# Patient Record
Sex: Female | Born: 1955
Health system: Southern US, Community
[De-identification: ages and names within clinical notes are randomized; demographics above are authoritative.]

## PROBLEM LIST (undated history)

## (undated) DIAGNOSIS — E876 Hypokalemia: Secondary | ICD-10-CM

## (undated) DIAGNOSIS — M1990 Unspecified osteoarthritis, unspecified site: Secondary | ICD-10-CM

## (undated) DIAGNOSIS — I4891 Unspecified atrial fibrillation: Secondary | ICD-10-CM

## (undated) DIAGNOSIS — E663 Overweight: Secondary | ICD-10-CM

## (undated) DIAGNOSIS — I10 Essential (primary) hypertension: Secondary | ICD-10-CM

## (undated) HISTORY — DX: Essential (primary) hypertension: I10

## (undated) HISTORY — DX: Unspecified osteoarthritis, unspecified site: M19.90

## (undated) HISTORY — DX: Unspecified atrial fibrillation: I48.91

## (undated) HISTORY — PX: LAPAROSCOPY: SHX197

## (undated) HISTORY — PX: OTHER SURGICAL HISTORY: SHX169

## (undated) HISTORY — PX: VESICOVAGINAL FISTULA CLOSURE W/ TAH: SUR271

## (undated) HISTORY — DX: Hypokalemia: E87.6

## (undated) HISTORY — PX: NASAL ENDOSCOPY: SHX286

## (undated) HISTORY — DX: Overweight: E66.3

## (undated) HISTORY — PX: CARDIAC ELECTROPHYSIOLOGY STUDY AND ABLATION: SHX1294

---

## 1998-05-22 ENCOUNTER — Other Ambulatory Visit: Admission: RE | Admit: 1998-05-22 | Discharge: 1998-05-22 | Payer: Self-pay | Admitting: *Deleted

## 2000-05-28 ENCOUNTER — Encounter (INDEPENDENT_AMBULATORY_CARE_PROVIDER_SITE_OTHER): Payer: Self-pay

## 2000-05-28 ENCOUNTER — Ambulatory Visit (HOSPITAL_COMMUNITY): Admission: RE | Admit: 2000-05-28 | Discharge: 2000-05-28 | Payer: Self-pay | Admitting: Obstetrics & Gynecology

## 2001-06-26 ENCOUNTER — Other Ambulatory Visit: Admission: RE | Admit: 2001-06-26 | Discharge: 2001-06-26 | Payer: Self-pay | Admitting: Obstetrics & Gynecology

## 2001-10-23 ENCOUNTER — Emergency Department (HOSPITAL_COMMUNITY): Admission: EM | Admit: 2001-10-23 | Discharge: 2001-10-23 | Payer: Self-pay | Admitting: Emergency Medicine

## 2002-02-22 ENCOUNTER — Ambulatory Visit (HOSPITAL_COMMUNITY): Admission: RE | Admit: 2002-02-22 | Discharge: 2002-02-22 | Payer: Self-pay | Admitting: *Deleted

## 2002-10-10 ENCOUNTER — Encounter: Payer: Self-pay | Admitting: Internal Medicine

## 2002-10-10 ENCOUNTER — Encounter: Admission: RE | Admit: 2002-10-10 | Discharge: 2002-10-10 | Payer: Self-pay | Admitting: Internal Medicine

## 2003-03-05 ENCOUNTER — Other Ambulatory Visit: Admission: RE | Admit: 2003-03-05 | Discharge: 2003-03-05 | Payer: Self-pay | Admitting: Obstetrics & Gynecology

## 2005-04-12 ENCOUNTER — Other Ambulatory Visit: Admission: RE | Admit: 2005-04-12 | Discharge: 2005-04-12 | Payer: Self-pay | Admitting: Obstetrics & Gynecology

## 2007-06-02 ENCOUNTER — Encounter: Admission: RE | Admit: 2007-06-02 | Discharge: 2007-06-02 | Payer: Self-pay | Admitting: Internal Medicine

## 2010-03-04 ENCOUNTER — Encounter: Admission: RE | Admit: 2010-03-04 | Discharge: 2010-03-04 | Payer: Self-pay | Admitting: Internal Medicine

## 2010-05-11 ENCOUNTER — Ambulatory Visit: Payer: Self-pay | Admitting: Cardiology

## 2010-05-11 ENCOUNTER — Other Ambulatory Visit: Payer: Self-pay | Admitting: Emergency Medicine

## 2010-05-11 ENCOUNTER — Inpatient Hospital Stay (HOSPITAL_COMMUNITY): Admission: EM | Admit: 2010-05-11 | Discharge: 2010-05-12 | Payer: Self-pay | Admitting: Cardiology

## 2010-05-12 ENCOUNTER — Encounter: Payer: Self-pay | Admitting: Cardiology

## 2010-05-12 ENCOUNTER — Encounter: Payer: Self-pay | Admitting: Internal Medicine

## 2010-05-13 ENCOUNTER — Telehealth: Payer: Self-pay | Admitting: Cardiology

## 2010-05-13 DIAGNOSIS — I10 Essential (primary) hypertension: Secondary | ICD-10-CM | POA: Insufficient documentation

## 2010-05-13 DIAGNOSIS — I4891 Unspecified atrial fibrillation: Secondary | ICD-10-CM | POA: Insufficient documentation

## 2010-05-13 DIAGNOSIS — Z8679 Personal history of other diseases of the circulatory system: Secondary | ICD-10-CM | POA: Insufficient documentation

## 2010-05-13 DIAGNOSIS — M1610 Unilateral primary osteoarthritis, unspecified hip: Secondary | ICD-10-CM | POA: Insufficient documentation

## 2010-05-13 DIAGNOSIS — M169 Osteoarthritis of hip, unspecified: Secondary | ICD-10-CM | POA: Insufficient documentation

## 2010-05-13 DIAGNOSIS — E876 Hypokalemia: Secondary | ICD-10-CM | POA: Insufficient documentation

## 2010-06-12 ENCOUNTER — Ambulatory Visit: Payer: Self-pay | Admitting: Cardiology

## 2010-10-11 ENCOUNTER — Emergency Department (HOSPITAL_BASED_OUTPATIENT_CLINIC_OR_DEPARTMENT_OTHER)
Admission: EM | Admit: 2010-10-11 | Discharge: 2010-10-11 | Payer: Self-pay | Source: Home / Self Care | Admitting: Emergency Medicine

## 2010-11-01 HISTORY — PX: JOINT REPLACEMENT: SHX530

## 2010-11-04 ENCOUNTER — Encounter: Payer: Self-pay | Admitting: Cardiology

## 2010-11-05 ENCOUNTER — Ambulatory Visit: Admit: 2010-11-05 | Payer: Self-pay | Admitting: Physician Assistant

## 2010-11-22 ENCOUNTER — Encounter: Payer: Self-pay | Admitting: Internal Medicine

## 2010-11-29 LAB — CONVERTED CEMR LAB
BUN: 21 mg/dL (ref 6–23)
Calcium: 10 mg/dL (ref 8.4–10.5)
Glucose, Bld: 90 mg/dL (ref 70–99)
Sodium: 140 meq/L (ref 135–145)

## 2010-12-01 NOTE — Letter (Signed)
Summary: [facility]  [facility]   Imported By: Marylou Mccoy 05/26/2010 09:13:00  _____________________________________________________________________  External Attachment:    Type:   Image     Comment:   External Document

## 2010-12-01 NOTE — Assessment & Plan Note (Signed)
Summary: eph/jml   Visit Type:  EPH Primary Provider:  Georgianne Fick  CC:  edema....Marland Kitchenno other complaints today.  History of Present Illness: Ms Mcclary returns today for followup of her paroxysmal atrial fibrillation with rapid ventricular rate.  Converted with IV diltiazem. Blood work was remarkable for potassium of 2.9, normal CPK-MB mildly elevated troponin. This is felt to be demand ischemia. TSH was normal. Hemoglobin was 12.9, blood sugar was normal.  She's had no recurrent symptoms. She was very symptomatic upon presentation.  Echocardiogram showed normal left ventricular systolic function, mild left ventricular hypertrophy, normal left atrial size.  She has a history of difficult to control hypertension for a number of years. She says she averages around 150/90. She checks regularly with an automated cuff. It sounds like her cuff may not be a large adult cuff.    Current Medications (verified): 1)  Cardizem Cd 240 Mg Xr24h-Cap (Diltiazem Hcl Coated Beads) .Marland Kitchen.. 1 Cap Once Daily 2)  Aspirin Ec 325 Mg Tbec (Aspirin) .... Take One Tablet By Mouth Daily 3)  Potassium Chloride Cr 10 Meq Cr-Tabs (Potassium Chloride) .Marland Kitchen.. 1 Tab Once Daily 4)  Furosemide 20 Mg Tabs (Furosemide) .Marland Kitchen.. 1 Tab Once Daily 5)  Micardis 80 Mg Tabs (Telmisartan) .Marland Kitchen.. 1 Tab Once Daily 6)  Multivitamins   Tabs (Multiple Vitamin) .Marland Kitchen.. 1 Tab  Once Daily 7)  Metoprolol Succinate 50 Mg Xr24h-Tab (Metoprolol Succinate) .Marland Kitchen.. 1 Tab Once Daily  Allergies (verified): 1)  ! * Shellfish  Past History:  Past Medical History: Last updated: 05/13/2010 Left hip osteoarthritis Hypertension H/O palpatations Hypokalemia  Past Surgical History: Last updated: 05/13/2010 Hysteroscopy, dilatation and curettage and removal of endometrial polyp, followed by laparoscopy with lysis of pelvic adhesions, drainage of follicular functional-type cyst of left ovary, uterosacral nerve ablation with bipolar coagulation. SURGEON:   Freddy Finner, M.D. Upper endoscopy.  Family History: Last updated: 06/12/2010 Noncontributory for early coronary artery disease.     Mother: deceased  73.Marland KitchenMarland KitchenMVA Father: deceased @ 3...cancer  Social History: Last updated: 05/13/2010  The patient lives in [place] with her husband.  She   is a Environmental consultant.  She has 2 children.  The patient has a 5 pack-  year tobacco history, but quit smoking 4 years ago.  She does drink an  occasional alcoholic beverage.  She denies any illicit drug use.  She  walks her dog for approximately 30 minutes a day.  She has no specific  diet.   Family History: Noncontributory for early coronary artery disease.     Mother: deceased  87.Marland KitchenMarland KitchenMVA Father: deceased @ 48...cancer  Review of Systems       negative other than history of present illness  Vital Signs:  Patient profile:   55 year old female Height:      69 inches Weight:      188.8 pounds BMI:     27.98 Pulse rate:   77 / minute Pulse rhythm:   irregular BP sitting:   146 / 100  (left arm) Cuff size:   large  Vitals Entered By: Danielle Rankin, CMA (June 12, 2010 2:55 PM)  Physical Exam  General:  overweight, in no acute distress Head:  normocephalic and atraumatic Eyes:  PERRLA/EOM intact; conjunctiva and lids normal. Neck:  Neck supple, no JVD. No masses, thyromegaly or abnormal cervical nodes. Chest Bambie Pizzolato:  no deformities or breast masses noted Lungs:  Clear bilaterally to auscultation and percussion. Heart:  PMI nondisplaced, normal S1-S2, no gallop, carotid strokes  equal bilaterally without bruits Msk:  Back normal, normal gait. Muscle strength and tone normal. Pulses:  pulses normal in all 4 extremities Extremities:  No clubbing or cyanosis. Neurologic:  Alert and oriented x 3. Skin:  Intact without lesions or rashes. Psych:  Normal affect.   EKG  Procedure date:  06/12/2010  Findings:      normal sinus rhythm, no acute changes.  Impression &  Recommendations:  Problem # 1:  ATRIAL FIBRILLATION (ICD-427.31) Assessment Improved  Her updated medication list for this problem includes:    Aspirin Ec 325 Mg Tbec (Aspirin) .Marland Kitchen... Take one tablet by mouth daily    Metoprolol Succinate 50 Mg Xr24h-tab (Metoprolol succinate) .Marland Kitchen... 1 tab once daily  Orders: EKG w/ Interpretation (93000) T-Basic Metabolic Panel ([id number])  Problem # 2:  HYPERTENSION (ICD-401.9) Assessment: Unchanged I have spent about 20 minutes talking to her about the importance of blood pressure control. I have increased her diltiazem to 360 mg a day. She is maximized on Micardis. Advised to check her blood pressure at rest with a large adult cuff and keep a record. She will return in 2 weeks for a nurse visit with her blood pressure record. If her pressures consistently above 140/90, we will add clonidine 0.2 b.i.d. I've advised her to increase the potassium in her diet. We will check electrolytes today. Her updated medication list for this problem includes:    Diltzac 360 Mg Xr24h-cap (Diltiazem hcl er beads) .Marland Kitchen... Take 1 tablet daily    Aspirin Ec 325 Mg Tbec (Aspirin) .Marland Kitchen... Take one tablet by mouth daily    Furosemide 20 Mg Tabs (Furosemide) .Marland Kitchen... 1 tab once daily    Micardis 80 Mg Tabs (Telmisartan) .Marland Kitchen... 1 tab once daily    Metoprolol Succinate 50 Mg Xr24h-tab (Metoprolol succinate) .Marland Kitchen... 1 tab once daily  Orders: EKG w/ Interpretation (93000) T-Basic Metabolic Panel ([id number])  Problem # 3:  HYPOKALEMIA (ICD-276.8) Assessment: Improved Will check today  Patient Instructions: 1)  Your physician recommends that you schedule a follow-up appointment in: 2 weeks with the nurse room for a blood pressure check.   2)  Your physician has requested that you limit the intake of sodium (salt) in your diet to two grams daily. Please see [facility] handout. 3)  Your physician recommends that you continue on your current medications as directed. Please refer to the  Current Medication list given to you today. CARDIZEM increased to 360mg  daily 4)  Your physician has requested that you regularly monitor and record your blood pressure readings at home.  Please use the same machine at the same time of day to check your readings and record them to bring to your follow-up visit. KEEP A LOG OF YOUR BLOOD PRESSURE.  TAKE BP 1 HOUR AFTER TAKING BP MEDS AND AFTER 20 MINUTES OF RESTING. Prescriptions: DILTZAC 360 MG XR24H-CAP (DILTIAZEM HCL ER BEADS) Take 1 tablet daily  #30 x 11   Entered by:   Lisabeth Devoid RN   Authorized by:   Gaylord Shih, MD, [facility]   Signed by:   Gaylord Shih, MD, [facility] on 06/12/2010   Method used:   Electronically to        [facility] [address] (retail)       [phone]       [place], [place]  [address]       Ph: [phone]       Fax: [phone]  RxID:   [id number]

## 2010-12-01 NOTE — Progress Notes (Signed)
Summary: pt needs medication  Phone Note Call from Patient Call back at [phone]   Caller: Patient Reason for Call: Talk to Nurse, Talk to Doctor Summary of Call: [facility] on [address] has a RX for cardezem cd 240mg  that was written for pt by Dr. Daleen Squibb prior to her leaving the hosp but they are stating they have faxed [place] several times as to if it is a capsule or tablet and we have not responded and pt would like to get her meds Initial call taken by: Omer Jack,  May 13, 2010 10:53 AM    New/Updated Medications: CARDIZEM CD 240 MG XR24H-CAP (DILTIAZEM HCL COATED BEADS) 1 cap once daily Prescriptions: CARDIZEM CD 240 MG XR24H-CAP (DILTIAZEM HCL COATED BEADS) 1 cap once daily  #30 x 1   Entered by:   Danielle Rankin, CMA   Authorized by:   Gaylord Shih, MD, [facility]   Signed by:   Danielle Rankin, CMA on 05/13/2010   Method used:   Telephoned to ...       [facility] [address] (retail)       [phone]       [place], [place]  [address]       Ph: [phone]       Fax: [phone]   RxID:   [id number]

## 2010-12-03 NOTE — Letter (Signed)
Summary: [facility]   [facility]   Imported By: Roderic Ovens 11/19/2010 15:33:35  _____________________________________________________________________  External Attachment:    Type:   Image     Comment:   External Document

## 2010-12-15 ENCOUNTER — Other Ambulatory Visit: Payer: Self-pay | Admitting: Orthopedic Surgery

## 2010-12-15 ENCOUNTER — Encounter (HOSPITAL_COMMUNITY): Payer: Federal, State, Local not specified - PPO | Attending: Orthopedic Surgery

## 2010-12-15 DIAGNOSIS — Z87891 Personal history of nicotine dependence: Secondary | ICD-10-CM | POA: Insufficient documentation

## 2010-12-15 DIAGNOSIS — I4891 Unspecified atrial fibrillation: Secondary | ICD-10-CM | POA: Insufficient documentation

## 2010-12-15 DIAGNOSIS — Z01812 Encounter for preprocedural laboratory examination: Secondary | ICD-10-CM | POA: Insufficient documentation

## 2010-12-15 DIAGNOSIS — M1610 Unilateral primary osteoarthritis, unspecified hip: Secondary | ICD-10-CM | POA: Insufficient documentation

## 2010-12-15 DIAGNOSIS — M129 Arthropathy, unspecified: Secondary | ICD-10-CM | POA: Insufficient documentation

## 2010-12-15 DIAGNOSIS — Z79899 Other long term (current) drug therapy: Secondary | ICD-10-CM | POA: Insufficient documentation

## 2010-12-15 DIAGNOSIS — I10 Essential (primary) hypertension: Secondary | ICD-10-CM | POA: Insufficient documentation

## 2010-12-15 LAB — SURGICAL PCR SCREEN: MRSA, PCR: NEGATIVE

## 2010-12-15 LAB — PROTIME-INR: INR: 0.99 (ref 0.00–1.49)

## 2010-12-15 LAB — COMPREHENSIVE METABOLIC PANEL
Albumin: 4.5 g/dL (ref 3.5–5.2)
BUN: 29 mg/dL — ABNORMAL HIGH (ref 6–23)
Creatinine, Ser: 1.51 mg/dL — ABNORMAL HIGH (ref 0.4–1.2)
Total Bilirubin: 0.7 mg/dL (ref 0.3–1.2)
Total Protein: 8.3 g/dL (ref 6.0–8.3)

## 2010-12-15 LAB — CBC
Platelets: 243 10*3/uL (ref 150–400)
RBC: 3.9 MIL/uL (ref 3.87–5.11)
WBC: 4.8 10*3/uL (ref 4.0–10.5)

## 2010-12-15 LAB — DIFFERENTIAL
Basophils Relative: 0 % (ref 0–1)
Eosinophils Absolute: 0.1 10*3/uL (ref 0.0–0.7)
Lymphs Abs: 2.4 10*3/uL (ref 0.7–4.0)
Neutrophils Relative %: 41 % — ABNORMAL LOW (ref 43–77)

## 2010-12-15 LAB — URINALYSIS, ROUTINE W REFLEX MICROSCOPIC
Nitrite: NEGATIVE
Specific Gravity, Urine: 1.011 (ref 1.005–1.030)
pH: 5 (ref 5.0–8.0)

## 2010-12-15 LAB — APTT: aPTT: 32 seconds (ref 24–37)

## 2010-12-23 ENCOUNTER — Inpatient Hospital Stay (HOSPITAL_COMMUNITY): Payer: Federal, State, Local not specified - PPO

## 2010-12-23 ENCOUNTER — Inpatient Hospital Stay (HOSPITAL_COMMUNITY)
Admission: RE | Admit: 2010-12-23 | Discharge: 2010-12-26 | DRG: 818 | Disposition: A | Discharge status: Home Health | Payer: Federal, State, Local not specified - PPO | Source: Ambulatory Visit | Attending: Orthopedic Surgery | Admitting: Orthopedic Surgery

## 2010-12-23 DIAGNOSIS — X58XXXA Exposure to other specified factors, initial encounter: Secondary | ICD-10-CM | POA: Diagnosis present

## 2010-12-23 DIAGNOSIS — M169 Osteoarthritis of hip, unspecified: Principal | ICD-10-CM | POA: Diagnosis present

## 2010-12-23 DIAGNOSIS — I4891 Unspecified atrial fibrillation: Secondary | ICD-10-CM | POA: Diagnosis present

## 2010-12-23 DIAGNOSIS — S73006A Unspecified dislocation of unspecified hip, initial encounter: Secondary | ICD-10-CM | POA: Diagnosis present

## 2010-12-23 DIAGNOSIS — M1610 Unilateral primary osteoarthritis, unspecified hip: Principal | ICD-10-CM | POA: Diagnosis present

## 2010-12-23 DIAGNOSIS — I10 Essential (primary) hypertension: Secondary | ICD-10-CM | POA: Diagnosis present

## 2010-12-23 LAB — TYPE AND SCREEN: ABO/RH(D): O NEG

## 2010-12-23 LAB — HEMOGLOBIN AND HEMATOCRIT, BLOOD: HCT: 29.9 % — ABNORMAL LOW (ref 36.0–46.0)

## 2010-12-24 LAB — HEMOGLOBIN AND HEMATOCRIT, BLOOD: Hemoglobin: 9.2 g/dL — ABNORMAL LOW (ref 12.0–15.0)

## 2010-12-24 LAB — PROTIME-INR: INR: 1.07 (ref 0.00–1.49)

## 2010-12-25 LAB — PROTIME-INR: INR: 1.26 (ref 0.00–1.49)

## 2010-12-26 LAB — HEMOGLOBIN AND HEMATOCRIT, BLOOD
HCT: 24.6 % — ABNORMAL LOW (ref 36.0–46.0)
Hemoglobin: 8.2 g/dL — ABNORMAL LOW (ref 12.0–15.0)

## 2011-01-01 NOTE — Op Note (Signed)
Jill Escobar, Jill Escobar               ACCOUNT NO.:  [id number]  MEDICAL RECORD NO.:  [id number]           PATIENT TYPE:  I  LOCATION:  1614                         FACILITY:  [facility]  PHYSICIAN:  Georges Lynch. Rozlynn Lippold, M.D.DATE OF BIRTH:  1956-10-13  DATE OF PROCEDURE:  12/23/2010 DATE OF DISCHARGE:                              OPERATIVE REPORT   SURGEON:  Georges Lynch. Darrelyn Hillock, M.D.  ASSISTANT: 1. Madlyn Frankel. Charlann Boxer, M.D. 2. Rozell Searing, [place].  PREOPERATIVE DIAGNOSIS:  Severe degenerative arthritis with lateral subluxation of the left hip.  POSTOPERATIVE DIAGNOSIS:  Severe degenerative arthritis with lateral subluxation of the left hip.  OPERATION:  Left total hip arthroplasty utilizing DePuy system.  The sizes used, we used a pinnacle cup 52 mm outside diameter with 130 mm screw for fixation.  We utilized the hole eliminator in the cup.  The liner of the cup was a +4 neutral 36-mm inside diameter AltrX polyethylene liner.  The stem size which was size 1 high offset Tri-Lock stem.  The head was a +1.5, 36 mm diameter ceramic head.  DESCRIPTION OF PROCEDURE:  Under general anesthesia, the patient on the right side and left side up, routine orthopedic prepping and draping of the left hip was carried out.  She had 2 g of IV Ancef.  The appropriate time-out was carried out in the operating room.  I marked her left hip in the holding area.  At this time, a posterolateral approach in left hip was carried out.  Bleeders were identified and cauterized.  Self- retaining retractors were inserted.  I went down over the greater trochanter, incised the iliotibial band and dissected the gluteal muscle by blunt dissection.  Bleeders were identified and cauterized. Following that, I detached the external rotators from the femoral neck, partially detached those.  Great care was taken not to injure the sciatic nerve.  The sciatic nerve was kept out of harm's way at all times.  I then went down and  easily identified the capsule and did a capsulectomy, dislocated the head and amputated the femoral head at the usual neck length.  At this time, we used a widening reamer to ream out the cancellus bone from the greater trochanter.  I then inserted the canal finder for canal location.  The canal was quite narrow and I thoroughly irrigated out the canal in the usual fashion and rasped the canal up to a size 1.  We could not go higher than 1 because the canal was extremely tight.  We had an excellent fit with the size 1.  I then thoroughly irrigated the area out after I removed the rasp.  I then packed it with a sponge and then attention was directed to the acetabulum.  We removed all the spurs with osteotome in regards to the overlying acetabular region.  I then did a complete capsulectomy and we reamed the acetabulum up to 51 for a size 52-mm cup.  We inserted our permanent 52-mm pinnacle cup with 130-mm cancellus screw for fixation. At that time, we then inserted our hole eliminator and then inserted our permanent AltrX cup.  Following that,  we went through the trials again and finally selected a +1.5 neck length.  We selected a high offset Tri- Lock stem was well.  Note, we went through the trials in order to obtain the best stability and the best leg length that we could.  Leg lengths were taken and they were a significant consideration during this procedure.  We then removed our trial 1.5 ball and then inserted our permanent ceramic 1.5, 36 mm diameter ceramic ball.  I thoroughly irrigated out the area, reduced the hip and we made sure there was no soft tissue in the acetabulum.  We took the hip through motion and hip was stable with good leg lengths.  I then injected 10 cc of Surgiflo into the subacetabular region and then a piece of thrombin-soaked Gelfoam was inserted.  I then closed the wound in layers in usual fashion.  The subcu was closed with Vicryl, skin with metal staples  and a sterile Neosporin dressing was applied.  She had 2 g IV Ancef preop.  Note, the surgeon Dr. Charlann Boxer assisted Rozell Searing.          ______________________________ Georges Lynch. Darrelyn Hillock, M.D.     RAG/MEDQ  D:  12/23/2010  T:  12/23/2010  Job:  [id number]  cc:   Doylene Canning. Ladona Ridgel, MD [address]  [address] [place] [address]  Georgianne Fick, M.D. Fax: [phone]  Jesse Sans. Wall, MD, FACC [address]  [address] [place] [address]  Georges Lynch. Darrelyn Hillock, M.D. Fax: [phone]  Electronically Signed by Ranee Gosselin M.D. on 01/01/2011 12:12:28 PM

## 2011-01-12 LAB — BASIC METABOLIC PANEL
CO2: 27 mEq/L (ref 19–32)
Calcium: 10 mg/dL (ref 8.4–10.5)
Creatinine, Ser: 0.9 mg/dL (ref 0.4–1.2)
GFR calc Af Amer: >60 mL/min (ref >60)
Glucose, Bld: 86 mg/dL (ref 70–99)

## 2011-01-17 LAB — BASIC METABOLIC PANEL
BUN: 21 mg/dL (ref 6–23)
BUN: 33 mg/dL — ABNORMAL HIGH (ref 6–23)
Calcium: 9.3 mg/dL (ref 8.4–10.5)
Calcium: 9.8 mg/dL (ref 8.4–10.5)
Creatinine, Ser: 1.4 mg/dL — ABNORMAL HIGH (ref 0.4–1.2)
GFR calc non Af Amer: 39 mL/min — ABNORMAL LOW (ref >60)
GFR calc non Af Amer: >60 mL/min (ref >60)
Glucose, Bld: 114 mg/dL — ABNORMAL HIGH (ref 70–99)
Potassium: 2.9 mEq/L — ABNORMAL LOW (ref 3.5–5.1)
Sodium: 142 mEq/L (ref 135–145)

## 2011-01-17 LAB — CARDIAC PANEL(CRET KIN+CKTOT+MB+TROPI)
CK, MB: 5.7 ng/mL — ABNORMAL HIGH (ref 0.3–4.0)
Relative Index: 2.8 — ABNORMAL HIGH (ref 0.0–2.5)
Total CK: 201 U/L — ABNORMAL HIGH (ref 7–177)
Troponin I: 0.1 ng/mL — ABNORMAL HIGH (ref 0.00–0.06)

## 2011-01-17 LAB — LIPID PANEL
HDL: 96 mg/dL (ref >39)
LDL Cholesterol: 102 mg/dL — ABNORMAL HIGH (ref 0–99)
Total CHOL/HDL Ratio: 2.1 RATIO
Triglycerides: 40 mg/dL (ref <150)
VLDL: 8 mg/dL (ref 0–40)

## 2011-01-17 LAB — CBC
MCH: 34.3 pg — ABNORMAL HIGH (ref 26.0–34.0)
MCHC: 33.5 g/dL (ref 30.0–36.0)
Platelets: 177 10*3/uL (ref 150–400)
Platelets: 225 10*3/uL (ref 150–400)
RBC: 3.77 MIL/uL — ABNORMAL LOW (ref 3.87–5.11)
RDW: 12.9 % (ref 11.5–15.5)
RDW: 14 % (ref 11.5–15.5)
WBC: 4.9 10*3/uL (ref 4.0–10.5)

## 2011-01-17 LAB — POCT CARDIAC MARKERS
CKMB, poc: 6.6 ng/mL (ref 1.0–8.0)
Myoglobin, poc: 194 ng/mL (ref 12–200)

## 2011-01-17 LAB — DIFFERENTIAL
Basophils Absolute: 0.1 10*3/uL (ref 0.0–0.1)
Basophils Relative: 2 % — ABNORMAL HIGH (ref 0–1)
Neutro Abs: 2.6 10*3/uL (ref 1.7–7.7)
Neutrophils Relative %: 46 % (ref 43–77)

## 2011-01-17 LAB — HEPARIN LEVEL (UNFRACTIONATED): Heparin Unfractionated: 0.5 IU/mL (ref 0.30–0.70)

## 2011-01-20 NOTE — Discharge Summary (Addendum)
Jill Escobar, Jill Escobar               ACCOUNT NO.:  [id number]  MEDICAL RECORD NO.:  [id number]           PATIENT TYPE:  I  LOCATION:  1614                         FACILITY:  [facility]  PHYSICIAN:  Georges Lynch. Shavana Calder, M.D.DATE OF BIRTH:  [birth year]  DATE OF ADMISSION:  12/23/2010 DATE OF DISCHARGE:  12/26/2010                              DISCHARGE SUMMARY   ADMITTING DIAGNOSES: 1. End-stage arthritis of the left hip. 2. Hypertension. 3. History of atrial fibrillation.  DISCHARGE DIAGNOSES: 1. End-stage arthritis of the left hip, status post left total hip     arthroplasty. 2. Hypertension. 3. History of atrial fibrillation.  PROCEDURE:  On December 23, 2010, Jill Escobar was taken to the operating room by surgeon Dr. Ranee Gosselin, assistant Dr. Durene Romans and Rozell Searing, PA-C.  She underwent left total hip arthroplasty.  The procedure was performed under general anesthesia.  She received 2 g of IV Ancef preoperatively.  Routine orthopedic prep and drape were carried out.  There were no complications with procedure.  The patient was returned to the recovery room in satisfactory condition.  Postoperative radiograph revealed left total hip replacement in good position.  HOSPITAL COURSE:  Jill Escobar was admitted to [facility] on December 23, 2010.  She underwent the above-stated procedure without complication.  After adequate time in the recovery room, she was taken to the floor for further recovery.  She was on PCA, reduced dose Dilaudid as well as muscle relaxants for pain control.  Postoperative day #1, the patient was resting comfortably, husband at bedside.  We discontinued the patient's PCA and Foley.  She was able to start physical therapy on postoperative day #1.  She was able to ambulate 125 feet using the rolling walker.  Postoperative day #2, the patient continued to do well, hemoglobin had dropped to 8.5.  We monitored this closely by ordering an  additional H and H that day.  Dressing was changed.  Wound was well approximated.  No signs of infection.  Jill Escobar continued her physical therapy.  She was able to ambulate 150 feet in the morning session and 175 feet in the afternoon session. Using the rolling walker without assistance.  She was able to practically go up and down stairs.  Postoperative day #3, the patient continued to progress and do well with her physical therapy.  Her hemoglobin had dropped further to 8.2.  She was allowed to discharge home on postoperative day #3 on oral iron supplement.  LABORATORY DATA:  On day of surgery, the patient's hemoglobin was 10. Postoperative day #1, it dropped to 9.2.  Postoperative day #2, her morning hemoglobin was 8.5, in the afternoon it was noted at 8.1 and postoperative day #3, hemoglobin was at 8.2.  She did not receive blood transfusion throughout her hospital stay.  Postoperative day #3, the patient's INR had reached 1.7, still subtherapeutic but trending upward.  DISPOSITION:  To home on postoperative day #3, December 26, 2010.  MEDICATIONS ON DISCHARGE: 1. Cardizem. 2. Toprol. 3. Potassium chloride. 4. Micardis. 5. Lasix. 6. Multivitamin. 7. Aspirin which she will discontinue while on Coumadin. 8. Hydrocodone,  she will discontinue because we are giving her     Percocet. 9. Iron supplements. 10.Percocet. 11.Robaxin. 12.Coumadin.  ACTIVITY:  She is to increase her activity slowly.  She is 50% weightbearing until her first postoperative visit which is 2 weeks from the day of surgery.  DIET:  No restrictions.  WOUND CARE:  Daily dressing change.  FOLLOWUP:  Again follow up in 2 weeks with Dr. Darrelyn Hillock from the day of surgery.  In the meantime, [facility] will come out to her house and start physical therapy.  They will also monitor the patient's INR and dose her Coumadin.  She will be on Coumadin for 4 weeks from the day of surgery.  CONDITION ON  DISCHARGE:  Improving.     Rozell Searing, PAC   ______________________________ Georges Lynch Darrelyn Hillock, M.D.    LD/MEDQ  D:  01/19/2011  T:  01/20/2011  Job:  [id number]  Electronically Signed by Rozell Searing  on 01/20/2011 07:47:24 AM Electronically Signed by Ranee Gosselin M.D. on 01/20/2011 07:58:46 AM

## 2011-01-22 NOTE — H&P (Addendum)
NAMENATELIE, OSTROSKY               ACCOUNT NO.:  [id number]  MEDICAL RECORD NO.:  [id number]           PATIENT TYPE:  O  LOCATION:  PADM                         FACILITY:  [facility]  PHYSICIAN:  Georges Lynch. Kya Mayfield, M.D.DATE OF BIRTH:  Feb 08, 1956  DATE OF ADMISSION:  12/15/2010 DATE OF DISCHARGE:  12/15/2010                             HISTORY & PHYSICAL   CHIEF COMPLAINT:  Left hip pain.  BRIEF HISTORY:  Ms. Alers came into see Dr. Darrelyn Hillock back in December. She is having increased pain down the anterior left thigh and in the left groin area.  This she started about a year ago.  She states; however, that has gotten much worse to a point now where she is unable to do things she would like to do.  Its painful to complete her activities of daily living and she now presents for left total hip arthroplasty.  ALLERGIES:  No known drug allergies.  Ms. Faulk does have a food allergy to shellfish.  PRIMARY CARE PHYSICIAN:  Georgianne Fick, MD  CARDIOLOGIST:  Dr. Daleen Squibb.  CURRENT MEDICATIONS: 1. Metoprolol 50 mg 1 tablet p.o. daily. 2. Diltzac 360 mg 1 tablet p.o. daily. 3. Lasix 20 mg 1 tablet p.o. daily. 4. Norco 5/325 mg 1 to 2 tablets p.o. q.4-6 hours p.r.n. pain. 5. Micardis 80 mg 1 tablet p.o. daily. 6. Klor-Con.  PAST MEDICAL HISTORY: 1. Hypertension. 2. History of atrial fibrillation. 3. Arthritis.  PAST SURGICAL HISTORY:  None.  FAMILY HISTORY:  Father passed at the age of 41, he had throat cancer. Mother passed at the age of 15 in a motor vehicle accident.  SOCIAL HISTORY:  Ms. Kitzmiller is married.  She works as a Research scientist (physical sciences). She admits past use of tobacco products.  States that she quit smoking cigarettes 5 years ago.  She drinks alcohol socially.  She has 2 children.  She does plan to go home following her hospital stay.  We will send physical therapy to her house.  REVIEW OF SYSTEMS:  GENERAL:  Negative for fevers, chills, or weight change.  HEENT: Negative  for headache, blurred vision, or insomnia. DERMATOLOGIC:  Negative for rash or lesion.  RESPIRATORY:  Negative for shortness of breath at rest or with exertion.  CARDIOVASCULAR:  Negative chest pain or palpitations.  GI:  Negative for nausea, vomiting, or diarrhea.  GU:  Negative for hematuria, dysuria.  MUSCULOSKELETAL: Positive for joint pain and back pain and muscle spasms.  PHYSICAL EXAMINATION:  VITAL SIGNS:  Pulse 80, respirations 18, blood pressure 140/78  in the left arm. GENERAL:  Ms. Auvil is a alert and oriented x3.  She is a pleasant 55- year-old female.  She is a stated height of 5 feet 9 inches and stated weight of 185 pounds.  She is in no distress. NECK:  Supple.  Full range of motion without lymphadenopathy. CHEST:  Lungs are clear to auscultation bilaterally without wheezes, rhonchi, or rales. HEART:  Regular rate and rhythm without murmur.  No AFib presently. ABDOMEN:  Bowel sounds present in all 4 quadrants.  Abdomen is soft, nontender, nondistended to palpation. EXTREMITIES:  The left  hip, Ms. Sherr has marked restriction of range of motion in the left hip with increased pain.  She also has some discomfort over the left greater trochanter. SKIN:  Unremarkable. NEUROLOGIC:  Intact. PERIPHERAL VASCULAR:  Carotid pulses 2+ bilaterally without bruit.  RADIOGRAPH:  AP and lateral views of the patient's left hip reveal collapse of the left femoral head and this does seem to have progressed since previous x-rays.  IMPRESSION:  End-stage arthritis of the left hip.  PLAN:  Left total hip arthroplasty to be performed by Dr. Darrelyn Hillock on December 23, 2010.     Rozell Searing, PAC   ______________________________ Georges Lynch Darrelyn Hillock, M.D.    LD/MEDQ  D:  12/22/2010  T:  12/22/2010  Job:  [id number]  Electronically Signed by Rozell Searing  on 12/23/2010 07:21:41 AM Electronically Signed by Ranee Gosselin M.D. on 01/01/2011 12:12:26 PM

## 2011-03-19 NOTE — Op Note (Signed)
[facility]  Patient:    Jill Escobar, Jill Escobar                      MRN: [id number] Proc. Date: 05/28/00 Adm. Date:  [id number] Attending:  Minette Headland                           Operative Report  PREOPERATIVE DIAGNOSES:       Chronic pelvic pain, menorrhagia, uterine fibroids.  POSTOPERATIVE DIAGNOSES:      Chronic pelvic pain, menorrhagia, uterine fibroids, with endometrial polyp and with bilateral clubbing of fallopian tubes and filmy pelvic adhesions.  OPERATIVE PROCEDURE:          Hysteroscopy, dilatation and curettage and removal of endometrial polyp, followed by laparoscopy with lysis of pelvic adhesions, drainage of follicular functional-type cyst of left ovary, uterosacral nerve ablation with bipolar coagulation.  SURGEON:                      Freddy Finner, M.D.  ESTIMATED INTRAOPERATIVE BLOOD LOSS:  30 cc. or less.  SORBITOL DEFICIT FROM HYSTEROSCOPY:  0.  INTRAOPERATIVE COMPLICATIONS:  None.  ANESTHESIA:  INDICATIONS:                  Patient is a 55 year old black married female, gravida 3, para 3, who first was seen in my office in February of this year complaining of an abnormal vaginal discharge.  At that time, the uterus was also identified to be enlarged to approximately 9-weeks-size and was tender. Subsequent pelvic ultrasound in the office did show areas consistent with fibroids and/or adenomyomas; showed no endometrial abnormality or adnexal abnormality.  The patient was at that point empirically treated with azithromycin.  She was brought in for followup exam in one week, at which time she reported improvement of the vaginal discharge but reported that she had had pain after her last examination.  In contrast to what was stated above, she did have an ovarian cyst which appeared to have some calcification in the left adnexa.  She is now admitted for hysteroscopy, D&C and laparoscopy.  INTRAOPERATIVE FINDINGS:       Intraoperative findings are essentially as recorded in the postoperative diagnoses.  The left ovarian cyst had appearance of a clear hemorrhagic or follicular-type cyst and was ruptured with spillage of clear-yellow fluid.  No fatty material or other debris was noted.  Each fallopian tube was clubbed and somewhat dilated.  There were filmy adhesions in the cul-de-sac.  There were adhesions of the anterior peritoneum overlying the bladder, onto the lower uterine segment.  There was no evidence of pelvic endometriosis.  The appendix was normal.  There was no apparent abnormality in the upper abdomen and no perihepatic adhesions.  DESCRIPTION OF PROCEDURE:     Patient was admitted on the morning of surgery, brought to the operating room, placed under adequate general endotracheal anesthesia and placed in the dorsal lithotomy position using the [facility] stirrup system.  Betadine prep of abdomen, perineum and vagina was carried out. Sterile drapes were applied to the legs and to the abdomen and the hysteroscopy procedure was carried out.  The bivalved speculum was introduced, after evacuating the bladder with a Robinson catheter.  Anterior cervical lip was grasped with a single-tooth tenaculum.  The cervix and uterus sounded to 10 cm.  Cervix was progressively dilated with Pratts to 23.  The 12 degree  ACMI hysteroscope was introduced using sorbitol 3% as the distending medium. Inspection did reveal a polyp in the lower uterine segment.  There was some debris on the fundal surface posteriorly in the upper segment.  Gentle thorough curettage and exploration with Randall stone forceps removed the polyp and the debris noted above and this was confirmed by reinspection with the hysteroscope.  This portion of the procedure was terminated, the instruments were removed, the Hulka tenaculum was attached to the cervix and the single-tooth tenaculum removed.  Additional sterile drapes were then applied to  the abdomen.  Two small incisions were made, one at the umbilicus, one just above the symphysis.  Through the upper incision, a 10-mm trocar was introduced while elevating the anterior abdominal wall manually.  Direct inspection revealed adequate placement with no evidence of injury on entry. Pneumoperitoneum was allowed to accumulate with CO2 gas.  A second 5-mm trocar was placed through the lower incision.  Using a blunt probe through this port for manipulation during the procedure, careful systematic examination was carried out.  Using the Metzenbaum-type-tipped scissors through the operating channel of the laparoscope and unipolar cautery, the adhesions noted above were lysed and the cyst on the left ovary was incised and drained.  The uterosacrals were placed on stretch and using the bipolar forceps, they were fulgurated at their junction with the cervix until they did not conduct current per the coagulation apparatus.  Hemostasis was complete.  At this point, the procedure was terminated.  Gas was allowed to escape from the abdomen.  The incision sites were injected with 0.5% plain Marcaine for postoperative analgesia.  The incisions were closed with interrupted subcuticular sutures of 3-0 Dexon.  Steri-Strips were applied to the lower incision.  The patient was taken to recovery in good condition and will be discharged with the routine outpatient surgical instructions.  She was given Vicodin to be taken as needed for postoperative pain. DD:  05/28/00 TD:  05/28/00 Job: [id number]

## 2011-03-19 NOTE — Procedures (Signed)
[facility]  Patient:    Jill Escobar, Jill Escobar Visit Number: [id number] MRN: [id number]          Service Type: END Location: ENDO Attending Physician:  Sabino Gasser Dictated by:   Sabino Gasser, M.D. Proc. Date: 02/22/02 Admit Date:  02/22/2002                             Procedure Report  PROCEDURE:  Upper endoscopy.  INDICATION:  Abdominal pain.  ANESTHESIA:  Demerol 50, Versed 6 mg.  DESCRIPTION OF PROCEDURE:  With patient mildly sedated in the left lateral decubitus position, the Olympus videoscopic endoscope was inserted into the mouth and passed under direct vision through the esophagus which appeared normal.  There was no evidence of Barretts.  We entered into the stomach; fundus, body, antrum, duodenal bulb, and second portion of duodenum all appeared normal.  From this point, the endoscope was slowly withdrawn, taking circumferential views of the entire duodenal mucosa until the endoscope then pulled back into the stomach and placed in retroflexion to view the stomach from below.  The endoscope was then straightened and withdrawn, taking circumferential views of the remaining gastric and esophageal mucosa. The patients vital signs and pulse oximeter remained stable.  The patient tolerated the procedure well without apparent complications.  FINDINGS:  Normal exam.  PLAN: 1. Add back Prevacid for reflux symptomatology. 2. Continue the Robinul and have patient follow up with me as an outpatient. Dictated by:   Sabino Gasser, M.D. Attending Physician:  Sabino Gasser DD:  02/22/02 TD:  02/22/02 Job: [id number]

## 2011-04-21 ENCOUNTER — Emergency Department (HOSPITAL_BASED_OUTPATIENT_CLINIC_OR_DEPARTMENT_OTHER)
Admission: EM | Admit: 2011-04-21 | Discharge: 2011-04-21 | Disposition: A | Discharge status: Other Acute Inpatient Hospital | Payer: Federal, State, Local not specified - PPO | Source: Home / Self Care | Attending: Emergency Medicine | Admitting: Emergency Medicine

## 2011-04-21 ENCOUNTER — Inpatient Hospital Stay (HOSPITAL_COMMUNITY)
Admission: AD | Admit: 2011-04-21 | Discharge: 2011-04-22 | DRG: 139 | Disposition: A | Discharge status: Home / Self Care | Payer: Federal, State, Local not specified - PPO | Source: Other Acute Inpatient Hospital | Attending: Cardiology | Admitting: Cardiology

## 2011-04-21 ENCOUNTER — Emergency Department (INDEPENDENT_AMBULATORY_CARE_PROVIDER_SITE_OTHER): Payer: Federal, State, Local not specified - PPO

## 2011-04-21 DIAGNOSIS — Z87891 Personal history of nicotine dependence: Secondary | ICD-10-CM

## 2011-04-21 DIAGNOSIS — R0602 Shortness of breath: Secondary | ICD-10-CM

## 2011-04-21 DIAGNOSIS — I4891 Unspecified atrial fibrillation: Secondary | ICD-10-CM

## 2011-04-21 DIAGNOSIS — R079 Chest pain, unspecified: Secondary | ICD-10-CM

## 2011-04-21 DIAGNOSIS — I10 Essential (primary) hypertension: Secondary | ICD-10-CM | POA: Diagnosis present

## 2011-04-21 DIAGNOSIS — M1990 Unspecified osteoarthritis, unspecified site: Secondary | ICD-10-CM | POA: Diagnosis present

## 2011-04-21 DIAGNOSIS — Z96649 Presence of unspecified artificial hip joint: Secondary | ICD-10-CM

## 2011-04-21 LAB — CBC
HCT: 40 % (ref 36.0–46.0)
MCHC: 35 g/dL (ref 30.0–36.0)
MCV: 91.7 fL (ref 78.0–100.0)
Platelets: 236 10*3/uL (ref 150–400)
RDW: 13.8 % (ref 11.5–15.5)
WBC: 5.1 10*3/uL (ref 4.0–10.5)

## 2011-04-21 LAB — DIFFERENTIAL
Eosinophils Absolute: 0.1 10*3/uL (ref 0.0–0.7)
Eosinophils Relative: 2 % (ref 0–5)
Lymphocytes Relative: 51 % — ABNORMAL HIGH (ref 12–46)
Lymphs Abs: 2.6 10*3/uL (ref 0.7–4.0)
Monocytes Absolute: 0.5 10*3/uL (ref 0.1–1.0)

## 2011-04-21 LAB — BASIC METABOLIC PANEL
Calcium: 10 mg/dL (ref 8.4–10.5)
GFR calc non Af Amer: 58 mL/min — ABNORMAL LOW (ref >60)
Glucose, Bld: 104 mg/dL — ABNORMAL HIGH (ref 70–99)
Sodium: 138 mEq/L (ref 135–145)

## 2011-04-21 LAB — CK TOTAL AND CKMB (NOT AT ARMC)
CK, MB: 2.9 ng/mL (ref 0.3–4.0)
Total CK: 116 U/L (ref 7–177)

## 2011-04-21 LAB — TROPONIN I: Troponin I: <0.3 ng/mL (ref <0.30)

## 2011-04-21 LAB — PROTIME-INR: Prothrombin Time: 12.6 seconds (ref 11.6–15.2)

## 2011-04-21 LAB — CARDIAC PANEL(CRET KIN+CKTOT+MB+TROPI): Troponin I: <0.3 ng/mL (ref <0.30)

## 2011-04-22 DIAGNOSIS — I4891 Unspecified atrial fibrillation: Secondary | ICD-10-CM

## 2011-04-22 LAB — CARDIAC PANEL(CRET KIN+CKTOT+MB+TROPI)
CK, MB: 2.4 ng/mL (ref 0.3–4.0)
CK, MB: 2.2 ng/mL (ref 0.3–4.0)
Relative Index: INVALID (ref 0.0–2.5)
Total CK: 68 U/L (ref 7–177)
Total CK: 76 U/L (ref 7–177)
Troponin I: <0.3 ng/mL (ref <0.30)

## 2011-04-27 ENCOUNTER — Encounter: Payer: Self-pay | Admitting: Cardiology

## 2011-04-30 ENCOUNTER — Other Ambulatory Visit (HOSPITAL_COMMUNITY): Payer: Self-pay | Admitting: Cardiovascular Disease

## 2011-04-30 DIAGNOSIS — R011 Cardiac murmur, unspecified: Secondary | ICD-10-CM

## 2011-05-03 ENCOUNTER — Other Ambulatory Visit (HOSPITAL_COMMUNITY): Payer: Federal, State, Local not specified - PPO | Admitting: Radiology

## 2011-05-03 ENCOUNTER — Encounter (HOSPITAL_COMMUNITY): Payer: Federal, State, Local not specified - PPO | Admitting: Radiology

## 2011-05-03 NOTE — H&P (Signed)
NAMESWAY, GUTTIERREZ NO.:  [id number]  MEDICAL RECORD NO.:  [id number]  LOCATION:  4742                         FACILITY:  [facility]  PHYSICIAN:  Rollene Rotunda, MD, FACCDATE OF BIRTH:  Sep 03, 1956  DATE OF ADMISSION:  04/21/2011 DATE OF DISCHARGE:                             HISTORY & PHYSICAL   PRIMARY CARE PHYSICIAN:  Georgianne Fick, MD  CARDIOLOGIST:  Jesse Sans. Wall, MD, FACC  REASON FOR PRESENTATION:  Evaluate the patient with atrial fibrillation.  HISTORY OF PRESENT ILLNESS:  The patient is 55 years old.  She has had one history of atrial fibrillation in July of last year.  She converted to sinus rhythm after being treated with IV Cardizem at that time.  She has had no subsequent paroxysms of this.  She has been managed for difficult to control hypertension.  Last year, she did have an echocardiogram, which demonstrated a preserved left ventricular function, though there was some difficulty with her images.  She had some mild LVH.  She reports a previous stress perfusion study possibly a couple of years ago, but I do not have these results.  She did have chest discomfort with last years episode.  Today she awoke and noticed some chest discomfort.  This was substernal and similar to previous.  It was moderate.  She also then noticed her heart racing.  This felt just like an episode of atrial fibrillation, so she presented to the [facility] Room where she was noted to be in atrial fibrillation with rapid rate.  I do not see an EKG.  She did subsequently convert to sinus rhythm after she was treated with IV Cardizem.  Her chest discomfort stopped at that point.  Prior to this, she had received some sublingual nitroglycerin and morphine with improvement in her symptoms.  She is currently comfortable.  She said otherwise she walks the dog and works 5 days a week.  With this level of activity, she does not bring on any chest pressure, neck or  arm discomfort.  She does not notice any shortness of breath, PND, or orthopnea.  She otherwise does not have palpitations, presyncope, or syncope.  She has been recovering from a hip surgery, has not been quite as active.  She has had some weight gain.  PAST MEDICAL HISTORY: 1. Hypertension. 2. Osteoarthritis. 3. Atrial fibrillation.  PAST SURGICAL HISTORY:  Left total hip arthroplasty, D and C.  SOCIAL HISTORY:  The patient is a Research scientist (physical sciences).  She has 2 children. She smoked briefly quitting 4 years ago.  She is married.  The patient does not drink alcohol or caffeine.  FAMILY HISTORY:  Noncontributory for early coronary artery disease.  Her father died early of cancer.  Her mother died early of a motor vehicle accident.  They were both in their 60s.  REVIEW OF SYSTEMS:  Positive for snoring, daytime fatigue.  Otherwise, as stated in the HPI and negative for all other systems.  PHYSICAL EXAMINATION:  GENERAL:  The patient is pleasant and in no distress. VITAL SIGNS:  Blood pressure 155/105, heart rate 84 and regular,respiratory rate 16, afebrile. HEENT:  Eyes unremarkable.  Pupils equal, round, and reactive  to light. Fundi not visualized.  Oral mucosa unremarkable. NECK:  No jugular venous distention at 45 degrees.  Carotid upstroke brisk and symmetric.  No bruits, no thyromegaly. LYMPHATICS:  No cervical, axillary, or inguinal adenopathy. LUNGS:  Clear to auscultation bilaterally. BACK:  No costovertebral tenderness. CHEST:  Unremarkable. HEART:  PMI not displaced or sustained.  S1 and S2 within normal limits. No S3, no S4, no clicks, no rubs, 2/6 right upper sternal border brief systolic murmur, nonradiating, no diastolic murmurs. ABDOMEN:  Mildly obese, positive bowel sounds, normal in frequency and pitch.  No bruits, no rebound, no guarding, no midline, no pulsatile mass.  No hepatomegaly, no splenomegaly. SKIN:  No rashes, no nodules. EXTREMITIES:  2+ pulses  throughout, no edema, no cyanosis, no clubbing. NEURO:  Oriented to person, place, and time.  Cranial nerves II through XII grossly intact, motor grossly intact.  EKG pending.  Chest x-ray, no acute disease.  LABORATORY DATA:  Sodium 138, BUN 18, creatinine 1.0.  WBC 5.1, hemoglobin 14.0, platelets 236.  ASSESSMENT/PLAN: 1. Atrial fibrillation.  The patient has now her second paroxysm of     atrial fibrillation.  I think she would be an excellent candidate     for a flecainide pill in pocket approach the next time she has an     episode.  We discussed this at length.  She prefers not to take     flecainide or any other antiarrhythmic daily.  She has had no     significant abnormalities on an echo.  She has had a negative     stress test, but I would want to repeat this prior to the     flecainide.  She understands that the next time she would have this     episode she will present to the emergency room where she could be     given this bolus of p.o. flecainide and observed.  For now, she has     a CHADS score of 1 and would prefer very much not to take     anticoagulation, therefore, aspirin is an option and she will     continue with this. 2. Hypertension.  This has been difficult to control.  Her blood     pressure at home run in the 140s over 90s.  We discussed     therapeutic lifestyle changes.  She very likely has sleep apnea.     She needs to be set up for a sleep     study at discharge and this could be appropriately treated and     perhaps bring her to target.  In addition, 5-10 pounds of weight     loss would be advised.  She very much wants to avoid any up     titration of her medications as she has been symptomatic with     multiple drugs.     Rollene Rotunda, MD, [facility]     JH/MEDQ  D:  04/21/2011  T:  04/22/2011  Job:  [id number]  cc:   Georgianne Fick, M.D.  Electronically Signed by Rollene Rotunda MD [facility] on 05/03/2011 02:27:07 PM

## 2011-05-11 NOTE — Discharge Summary (Signed)
NAMEBELLAMARIE, PFLUG NO.:  [id number]  MEDICAL RECORD NO.:  [id number]  LOCATION:  4742                         FACILITY:  [facility]  PHYSICIAN:  Noralyn Pick. Eden Emms, MD, FACCDATE OF BIRTH:  1956/07/06  DATE OF ADMISSION:  04/21/2011 DATE OF DISCHARGE:  04/22/2011                              DISCHARGE SUMMARY   PRIMARY CARDIOLOGIST:  Maisie Fus C. Daleen Squibb, MD, [facility]  PRIMARY CARE PROVIDER:  Georgianne Fick, MD at [facility].  DISCHARGE DIAGNOSES:  Atrial fibrillation with rapid ventricular response, converted to sinus rhythm. A.  CHADS2 score of 1 (hypertension).  SECONDARY DIAGNOSES: 1. Hypertension. 2. Osteoarthritis.  ALLERGIES:  SHELLFISH.  PROCEDURES/DIAGNOSTICS PERFORMED DURING HOSPITALIZATION:  Chest x-ray on April 21, 2011:  No active disease.  REASON FOR HOSPITALIZATION:  This is a 55 year old African American female with history of atrial dilation and hypertension.  She had one episode of atrial fibrillation last July.  Since that time, she has been doing well without episodes of palpitations.  On the day of admission, the patient awoke with some chest discomfort similar to her prior episode of atrial fibrillation, so she presented to the [facility] Emergency Room.  The patient was noted to be in atrial fibrillation with a rapid rate of 139 beats per minute.  The patient was treated with IV Cardizem and subsequently converted to sinus rhythm.  At that time, her chest discomfort stopped.  The patient was admitted for further observation.  HOSPITAL COURSE:  The patient was admitted to the telemetry unit.  She maintained sinus rhythm/sinus bradycardia during that time.  She was continued on her oral diltiazem as well as metoprolol.  EKG on April 22, 2011, showed normal sinus rhythm at a rate of 72 beats per minute without acute ST-T wave changes.  With this being the patient's second paroxysm of atrial fibrillation, it was felt that  she may be a candidate for flecainide pill-in-the-pocket if next time she has an episode.  It was also noted that the patient was not to be on antiarrhythmic therapy daily.  Her last echo was almost a year ago.  Therefore, this will be rechecked in the outpatient setting with further discussion of flecainide use if her echo remains within normal limits.  The patient will also undergo Myoview stress testing in the outpatient setting. Currently, the patient has a CHADS2 score of 1 with her hypertension. At this time, she is not to take anticoagulation, therefore, aspirin will be continued.  Of note, the patient was noted to have a systolic murmur on exam.  Therefore, this will also be evaluated by echo in the outpatient setting.  The patient is known to have difficulty controlling hypertension.  Dr. Antoine Poche recommended possible sleep apnea with treatments, which could help her hypertension target.  This will be further discussed wit her by her primary care provider.  On the day of discharge, Dr. Eden Emms evaluated the patient and noted her stable for home.  She had ruled out for myocardial infarction with cardiac enzymes being negative x3.  Her TSH is within normal limits. She was ambulating in the halls without difficulty.  Again, she remained in normal sinus rhythm.  DISCHARGE LABORATORIES:  Cardiac enzymes negative x3.  TSH 1.352.  DISCHARGE MEDICATIONS: 1. Aspirin 325 mg daily. 2. Advil over the counter 2 tablets daily as needed for headaches. 3. Diltiazem 360 mg ER 1 capsule daily. 4. Lasix 20 mg daily. 5. metoprolol succinate XL 50 mg daily. 6. Micardis 80 mg daily. 7. Multivitamin daily. 8. Percocet 5/325 one tablet daily as needed for hip pain. 9. Potassium chloride 10 mEq one tablet daily as needed.  FOLLOWUP PLANS AND INSTRUCTIONS: 1. The patient will have a Myoview in the [facility] office on     May 03, 2011, at 9:30 a.m.  She is to have no food or drink 12      hours prior to the test and may take her morning meds with a small     amount of water.  The patient will then proceed with an     echocardiogram on May 03, 2011, at 2:00 a.m. 2. The patient will follow up with Dr. Daleen Squibb on May 13, 2011, at 10:45     a.m. for further discussion of possible flecainide pill-in-the-     pocket as well as review stress test and echocardiogram. 3. The patient is to follow up with Dr. Nicholos Johns in the next 1-2     weeks for further sleep apnea study. 4. The patient to increase activity as tolerated. 5. The patient is to continue low-sodium heart-healthy diet. 6. The patient is to stop the activity that causes chest pain,     shortness of breath, or dizziness. 7. The patient is to call the office in the interim for any problems     or concerns.  DURATION OF DISCHARGE:  Greater than 30 minutes.     Leonette Monarch, PA-C   ______________________________ Noralyn Pick Eden Emms, MD, [facility]    NB/MEDQ  D:  04/22/2011  T:  04/23/2011  Job:  [id number]  cc:   Thomas C. Daleen Squibb, MD, [facility] Georgianne Fick, M.D.  Electronically Signed by Alen Blew P.A. on 04/25/2011 05:04:39 PM Electronically Signed by Charlton Haws MD [facility] on 05/11/2011 12:51:35 PM

## 2011-05-13 ENCOUNTER — Encounter: Payer: Federal, State, Local not specified - PPO | Admitting: Cardiology

## 2011-05-18 ENCOUNTER — Other Ambulatory Visit: Payer: Self-pay | Admitting: Internal Medicine

## 2011-06-23 ENCOUNTER — Other Ambulatory Visit: Payer: Self-pay | Admitting: Cardiology

## 2012-04-13 ENCOUNTER — Other Ambulatory Visit (HOSPITAL_COMMUNITY): Payer: Self-pay | Admitting: *Deleted

## 2012-04-13 ENCOUNTER — Other Ambulatory Visit: Payer: Self-pay | Admitting: Cardiology

## 2012-04-13 MED ORDER — DILTIAZEM HCL ER BEADS 360 MG PO CP24: 360.0000 mg | ORAL_CAPSULE | Freq: Every day | ORAL | Status: DC

## 2012-04-25 ENCOUNTER — Telehealth: Payer: Self-pay | Admitting: *Deleted

## 2012-04-25 NOTE — Telephone Encounter (Signed)
LMTCB overdue for follow-up appt.  Mylo Red RN

## 2012-05-02 NOTE — Telephone Encounter (Signed)
LMOVM to call back and schedule follow-up appt.  Mylo Red RN

## 2012-05-11 ENCOUNTER — Encounter: Payer: Self-pay | Admitting: Cardiology

## 2012-05-11 ENCOUNTER — Ambulatory Visit (INDEPENDENT_AMBULATORY_CARE_PROVIDER_SITE_OTHER): Payer: Federal, State, Local not specified - PPO | Admitting: Cardiology

## 2012-05-11 VITALS — BP 130/84 | HR 50 | Ht 69.0 in | Wt 191.0 lb

## 2012-05-11 DIAGNOSIS — I10 Essential (primary) hypertension: Secondary | ICD-10-CM

## 2012-05-11 DIAGNOSIS — Z8679 Personal history of other diseases of the circulatory system: Secondary | ICD-10-CM

## 2012-05-11 DIAGNOSIS — E876 Hypokalemia: Secondary | ICD-10-CM

## 2012-05-11 DIAGNOSIS — I4891 Unspecified atrial fibrillation: Secondary | ICD-10-CM

## 2012-05-11 MED ORDER — DILTIAZEM HCL ER BEADS 360 MG PO CP24: 360.0000 mg | ORAL_CAPSULE | Freq: Every day | ORAL | Status: DC

## 2012-05-11 NOTE — Assessment & Plan Note (Signed)
These have worsened despite being on dual therapy with diltiazem and metoprolol. We'll check a 48-hour Holter monitor to confirm this is not runs of A. fib, hopefully only PACs or PVCs. I spent a long time talking to her today reassuring her these are not life threatening. I'm not sure she can dance. We'll also check a TSH, potassium and magnesium.

## 2012-05-11 NOTE — Assessment & Plan Note (Signed)
She used to be on a potassium supplement. Check labs. I reviewed a potassium rich diet at length.

## 2012-05-11 NOTE — Assessment & Plan Note (Signed)
No clinical recurrence. Check monitor. She's convinced she has not had A. fib.

## 2012-05-11 NOTE — Assessment & Plan Note (Signed)
Better control 

## 2012-05-11 NOTE — Progress Notes (Signed)
HPI Mr Jill Escobar returns today for evaluation and management of her history of palpitations, history of paroxysmal A. fib one time, hypertension, and history of hypokalemia.  She's had no recurrent A. fib that it caused her admission the hospital several years ago. However she does have intermittent unpredictable palpitations frequently. She is very frightened and anxious. She is almost gone the emergency room several times.  At the time of her A. fib, she had potassium 2.9 and her blood pressure was poorly controlled. Echocardiogram was normal.  Past Medical History  Diagnosis Date  . Osteoarthritis     L hip  . HTN (hypertension)   . Palpitations     h/o  . Hypokalemia     Current Outpatient Prescriptions  Medication Sig Dispense Refill  . aspirin 325 MG EC tablet Take 325 mg by mouth daily.        Marland Kitchen diltiazem (TAZTIA XT) 360 MG 24 hr capsule Take 1 capsule (360 mg total) by mouth daily.  90 capsule  3  . metoprolol (TOPROL-XL) 50 MG 24 hr tablet Take 50 mg by mouth daily.        . Multiple Vitamin (MULTIVITAMIN) tablet Take 1 tablet by mouth daily.        . naproxen sodium (ANAPROX) 220 MG tablet Take 220 mg by mouth as needed.      Marland Kitchen telmisartan-hydrochlorothiazide (MICARDIS HCT) 80-25 MG per tablet Take 1 tablet by mouth daily.      Marland Kitchen DISCONTD: diltiazem (TAZTIA XT) 360 MG 24 hr capsule Take 1 capsule (360 mg total) by mouth daily.  30 capsule  1    No Known Allergies  No family history on file.  History   Social History  . Marital Status: Married    Spouse Name: N/A    Number of Children: N/A  . Years of Education: N/A   Occupational History  . Not on file.   Social History Main Topics  . Smoking status: Former Games developer  . Smokeless tobacco: Not on file   Comment: has a 5 pack year hx. quit 4 years ago.   . Alcohol Use: Yes     occasional   . Drug Use: No  . Sexually Active: Not on file   Other Topics Concern  . Not on file   Social History Narrative   Lives  in [place] with her husband. Has 2 children. 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 Walks her dog approx. 30 min/day. Has no specific diet     ROS ALL NEGATIVE EXCEPT THOSE NOTED IN HPI  PE  General Appearance: well developed, well nourished in no acute distress, obese HEENT: symmetrical face, PERRLA, good dentition  Neck: no JVD, thyromegaly, or adenopathy, trachea midline Chest: symmetric without deformity Cardiac: PMI non-displaced, RRR, normal S1, S2, no gallop or murmur Lung: clear to ausculation and percussion Vascular: all pulses full without bruits  Abdominal: nondistended, nontender, good bowel sounds, no HSM, no bruits Extremities: no cyanosis, clubbing or edema, no sign of DVT, no varicosities  Skin: normal color, no rashes Neuro: alert and oriented x 3, non-focal Pysch: normal affect  EKG Sinus bradycardia, minimal voltage criteria for LVH, no significant change. BMET    Component Value Date/Time   NA 138 04/21/2011 1224   K 4.1 04/21/2011 1224   CL 102 04/21/2011 1224   CO2 25 04/21/2011 1224   GLUCOSE 104* 04/21/2011 1224   BUN 18 04/21/2011 1224   CREATININE 1.00 04/21/2011 1224   CALCIUM 10.0 04/21/2011 1224  GFRNONAA 58* 04/21/2011 1224   GFRAA >60 04/21/2011 1224    Lipid Panel     Component Value Date/Time   CHOL  Value: 206        ATP III CLASSIFICATION:  <200     mg/dL   Desirable  161-096  mg/dL   Borderline High  >=045    mg/dL   High       * 02/07/8118 0347   TRIG 40 05/12/2010 0347   HDL 96 05/12/2010 0347   CHOLHDL 2.1 05/12/2010 0347   VLDL 8 05/12/2010 0347   LDLCALC  Value: 102        Total Cholesterol/HDL:CHD Risk Coronary Heart Disease Risk Table                     Men   Women  1/2 Average Risk   3.4   3.3  Average Risk       5.0   4.4  2 X Average Risk   9.6   7.1  3 X Average Risk  23.4   11.0        Use the calculated Patient Ratio above and the CHD Risk Table to determine the patient's CHD Risk.        ATP III CLASSIFICATION (LDL):  <100     mg/dL   Optimal  147-829   mg/dL   Near or Above                    Optimal  130-159  mg/dL   Borderline  562-130  mg/dL   High  >865     mg/dL   Very High* 7/84/6962 0347    CBC    Component Value Date/Time   WBC 5.1 04/21/2011 1224   RBC 4.36 04/21/2011 1224   HGB 14.0 04/21/2011 1224   HCT 40.0 04/21/2011 1224   PLT 236 04/21/2011 1224   MCV 91.7 04/21/2011 1224   MCH 32.1 04/21/2011 1224   MCHC 35.0 04/21/2011 1224   RDW 13.8 04/21/2011 1224   LYMPHSABS 2.6 04/21/2011 1224   MONOABS 0.5 04/21/2011 1224   EOSABS 0.1 04/21/2011 1224   BASOSABS 0.0 04/21/2011 1224

## 2012-05-11 NOTE — Patient Instructions (Addendum)
Your physician recommends that you schedule a follow-up appointment as needed with Dr. Daleen Squibb.  Your physician has recommended that you wear a 48 hour holter monitor. Holter monitors are medical devices that record the heart's electrical activity. Doctors most often use these monitors to diagnose arrhythmias. Arrhythmias are problems with the speed or rhythm of the heartbeat. The monitor is a small, portable device. You can wear one while you do your normal daily activities. This is usually used to diagnose what is causing palpitations/syncope (passing out).  Labs today:  BMET, MAG, TSH  Potassium Content of Foods Potassium is a mineral. The body needs potassium to control blood pressure and to keep the muscles and nervous system healthy. Most foods contain potassium. Eating a variety of foods in the right amounts will help control the level of potassium in your body. Kidney problems can cause there to be too much potassium in the body. If this happens, you may need to lower the amount of potassium in your diet. Some medicines such as diuretics may cause your body to lose too much potassium. If this happens, you may need to increase the amount of potassium in your diet. COMMON SERVING SIZES The list below tells you how big or small some common portion sizes are:  1 oz.........4 stacked dice.   3 oz........Marland KitchenDeck of cards.   1 tsp.......Marland KitchenTip of little finger.   1 tbs......Marland KitchenMarland KitchenThumb.   2 tbs.......Marland KitchenGolf ball.    cup......Marland KitchenHalf of a fist.   1 cup.......Marland KitchenA fist.  FOODS HIGH IN POTASSIUM More than 250 mg per serving.  Bran cereals and other bran products.   Milk (skim, 1%, 2%, whole).   Buttermilk.   Yogurt.   Avocados.   Bananas.   Dried fruits.   Kiwis.   Oranges.   Prunes.   Raisins.   Baked beans.   Spinach.   Tomatoes.   Peanut butter.   Nuts.   Tofu.   Potatoes.  FOODS MODERATE IN POTASSIUM Between 150 to 250mg  per serving.  Cherries.   Mangoes.    Asparagus.   Peas.   Zucchini.   Celery.   [place].   Peaches, fresh.   Broccoli stalks.   Peppers.   Figs.   Pears, fresh.   Kale.   Summer squashes.  FOODS LOW IN POTASSIUM Less than 150mg  per serving.  Pasta.   Rice.   Cottage cheese.   Cheddar cheese.   Apples.   Grapes.   Pineapple.   Raspberries.   Strawberries.   Watermelon.   Green beans.   Cabbage.   Cauliflower.   Corn.   Mushrooms.   Onions.   Eggs.  Document Released: 06/01/2005 Document Revised: 10/07/2011 Document Reviewed: 09/26/2007 [facility] Patient Information 2012 [place], [place].

## 2012-05-12 ENCOUNTER — Telehealth: Payer: Self-pay | Admitting: *Deleted

## 2012-05-12 LAB — BASIC METABOLIC PANEL
BUN: 29 mg/dL — ABNORMAL HIGH (ref 6–23)
CO2: 29 mEq/L (ref 19–32)
Calcium: 10.3 mg/dL (ref 8.4–10.5)
Creatinine, Ser: 1.2 mg/dL (ref 0.4–1.2)
GFR: 63.11 mL/min (ref >60.00)
Glucose, Bld: 99 mg/dL (ref 70–99)

## 2012-05-12 NOTE — Telephone Encounter (Signed)
Attempted to phone Jill Escobar at home number listed , but was not pt's number--tried husband's work number,that also was wrong number--unable to reach pt to let her know K+ was high, but cannot reach anyone --please call on Monday and find out where this pt is --thanks nt

## 2012-05-15 NOTE — Telephone Encounter (Signed)
Patient's employer was called at # [phone] they stated they are unable to give employees messages but she will make an exception and have patient call office and ask to speak to a triage nurse about lab work.

## 2012-05-16 NOTE — Telephone Encounter (Signed)
Patient called no answer.LMTC.Spoke to patient's employer they stated patient is out until this Wed or Thurs.Advised to have patient call office about her lab work, unable to reach patient by phone.

## 2012-05-17 ENCOUNTER — Telehealth: Payer: Self-pay | Admitting: Cardiology

## 2012-05-17 NOTE — Telephone Encounter (Signed)
Left message to call back  

## 2012-05-17 NOTE — Progress Notes (Signed)
Attempted to call pt, no answer. 

## 2012-05-17 NOTE — Telephone Encounter (Signed)
F/u    Patient calling - test results

## 2012-05-17 NOTE — Telephone Encounter (Signed)
Left message to call back on number listed above in contact information.

## 2012-05-18 ENCOUNTER — Other Ambulatory Visit: Payer: Self-pay | Admitting: *Deleted

## 2012-05-18 DIAGNOSIS — E875 Hyperkalemia: Secondary | ICD-10-CM

## 2012-05-18 NOTE — Telephone Encounter (Signed)
Pt advised to reduce intake of potassium rich foods and increase free water intake.  Advised to return on Monday, July 22 for a repeat BMET.

## 2012-05-18 NOTE — Telephone Encounter (Signed)
[phone] pt calling back

## 2012-05-22 ENCOUNTER — Other Ambulatory Visit: Payer: Federal, State, Local not specified - PPO

## 2012-05-22 NOTE — Telephone Encounter (Signed)
Pt aware see 05/17/12 phone message. Mylo Red RN

## 2012-05-23 ENCOUNTER — Other Ambulatory Visit (INDEPENDENT_AMBULATORY_CARE_PROVIDER_SITE_OTHER): Payer: Federal, State, Local not specified - PPO | Admitting: *Deleted

## 2012-05-23 DIAGNOSIS — E875 Hyperkalemia: Secondary | ICD-10-CM

## 2012-05-23 LAB — BASIC METABOLIC PANEL
Chloride: 102 mEq/L (ref 96–112)
Creatinine, Ser: 0.9 mg/dL (ref 0.4–1.2)
Potassium: 3.5 mEq/L (ref 3.5–5.1)
Sodium: 138 mEq/L (ref 135–145)

## 2012-06-07 ENCOUNTER — Telehealth: Payer: Self-pay | Admitting: *Deleted

## 2012-06-07 NOTE — Telephone Encounter (Signed)
Patient no-showed for monitor scheduled for 05/26/12 at 11 am. See note below.  Per Marlowe Kays while Windell Moulding was off.   7/12 @ 4:02 call number provided [phone] female stated wrong number, called x 2. call work # [phone]. line just rings. will make an appt mail appt card. gesila

## 2013-02-21 ENCOUNTER — Emergency Department (HOSPITAL_BASED_OUTPATIENT_CLINIC_OR_DEPARTMENT_OTHER)
Admission: EM | Admit: 2013-02-21 | Discharge: 2013-02-21 | Disposition: A | Discharge status: Home / Self Care | Payer: Federal, State, Local not specified - PPO | Attending: Emergency Medicine | Admitting: Emergency Medicine

## 2013-02-21 ENCOUNTER — Encounter (HOSPITAL_BASED_OUTPATIENT_CLINIC_OR_DEPARTMENT_OTHER): Payer: Self-pay

## 2013-02-21 DIAGNOSIS — Z8679 Personal history of other diseases of the circulatory system: Secondary | ICD-10-CM | POA: Insufficient documentation

## 2013-02-21 DIAGNOSIS — Z8639 Personal history of other endocrine, nutritional and metabolic disease: Secondary | ICD-10-CM | POA: Insufficient documentation

## 2013-02-21 DIAGNOSIS — Z79899 Other long term (current) drug therapy: Secondary | ICD-10-CM | POA: Insufficient documentation

## 2013-02-21 DIAGNOSIS — J3489 Other specified disorders of nose and nasal sinuses: Secondary | ICD-10-CM | POA: Insufficient documentation

## 2013-02-21 DIAGNOSIS — R05 Cough: Secondary | ICD-10-CM | POA: Insufficient documentation

## 2013-02-21 DIAGNOSIS — M1990 Unspecified osteoarthritis, unspecified site: Secondary | ICD-10-CM | POA: Insufficient documentation

## 2013-02-21 DIAGNOSIS — J069 Acute upper respiratory infection, unspecified: Secondary | ICD-10-CM

## 2013-02-21 DIAGNOSIS — Z7982 Long term (current) use of aspirin: Secondary | ICD-10-CM | POA: Insufficient documentation

## 2013-02-21 DIAGNOSIS — I10 Essential (primary) hypertension: Secondary | ICD-10-CM | POA: Insufficient documentation

## 2013-02-21 DIAGNOSIS — R0982 Postnasal drip: Secondary | ICD-10-CM | POA: Insufficient documentation

## 2013-02-21 DIAGNOSIS — R059 Cough, unspecified: Secondary | ICD-10-CM | POA: Insufficient documentation

## 2013-02-21 DIAGNOSIS — Z862 Personal history of diseases of the blood and blood-forming organs and certain disorders involving the immune mechanism: Secondary | ICD-10-CM | POA: Insufficient documentation

## 2013-02-21 DIAGNOSIS — Z87891 Personal history of nicotine dependence: Secondary | ICD-10-CM | POA: Insufficient documentation

## 2013-02-21 MED ORDER — CHLORPHENIRAMINE-ACETAMINOPHEN 2-325 MG PO TABS: 2.0000 | ORAL_TABLET | Freq: Four times a day (QID) | ORAL | Status: DC | PRN

## 2013-02-21 NOTE — ED Provider Notes (Signed)
History     CSN: [id number]  Arrival date & time 02/21/13  1054   First MD Initiated Contact with Patient 02/21/13 1256      Chief Complaint  Patient presents with  . URI  . Sore Throat    (Consider location/radiation/quality/duration/timing/severity/associated sxs/prior treatment) Patient is a 57 y.o. female presenting with URI and pharyngitis.  URI Sore Throat   Pt reports 2 weeks of waxing and waning but persistent URI symptoms. Nasal congestion, post-nasal drip, sore throat and dry cough. Denies any fever or SOB. She has taken antihistamines with minimal relief. Symptoms improved initially but then worsened the last few days. She has history of HTN.   Past Medical History  Diagnosis Date  . Osteoarthritis     L hip  . HTN (hypertension)   . Palpitations     h/o  . Hypokalemia     Past Surgical History  Procedure Laterality Date  . Vesicovaginal fistula closure w/ tah    . D&c and removal of endonetrial polyp    . Laparoscopy      with lysis of pelvic adhesions   . Drainage of cyst      follicular functional-type of cysyt- L ovary   . Uterosacral nerve ablation      with bipolar coagulation. Surgeon Dr. Jennette Kettle  . Nasal endoscopy      upper    No family history on file.  History  Substance Use Topics  . Smoking status: Former Games developer  . Smokeless tobacco: Not on file     Comment: has a 5 pack year hx. quit 4 years ago.   . Alcohol Use: Yes     Comment: occasional     OB History   Grav Para Term Preterm Abortions TAB SAB Ect Mult Living                  Review of Systems All other systems reviewed and are negative except as noted in HPI.   Allergies  Review of patient's allergies indicates no known allergies.  Home Medications   Current Outpatient Rx  Name  Route  Sig  Dispense  Refill  . aspirin 325 MG EC tablet   Oral   Take 325 mg by mouth daily.           Marland Kitchen diltiazem (TAZTIA XT) 360 MG 24 hr capsule   Oral   Take 1 capsule (360 mg  total) by mouth daily.   90 capsule   3   . metoprolol (TOPROL-XL) 50 MG 24 hr tablet   Oral   Take 50 mg by mouth daily.           . Multiple Vitamin (MULTIVITAMIN) tablet   Oral   Take 1 tablet by mouth daily.           . naproxen sodium (ANAPROX) 220 MG tablet   Oral   Take 220 mg by mouth as needed.         Marland Kitchen telmisartan-hydrochlorothiazide (MICARDIS HCT) 80-25 MG per tablet   Oral   Take 1 tablet by mouth daily.           BP 185/98  Pulse 64  Temp(Src) 98.1 F (36.7 C) (Oral)  Resp 20  Ht 5\' 9"  (1.753 m)  Wt 185 lb (83.915 kg)  BMI 27.31 kg/m2  SpO2 98%  Physical Exam  Nursing note and vitals reviewed. Constitutional: She is oriented to person, place, and time. She appears well-developed and well-nourished.  HENT:  Head: Normocephalic and atraumatic.  Mouth/Throat: Oropharynx is clear and moist. No oropharyngeal exudate.  Eyes: EOM are normal. Pupils are equal, round, and reactive to light.  Neck: Normal range of motion. Neck supple.  Cardiovascular: Normal rate, normal heart sounds and intact distal pulses.   Pulmonary/Chest: Effort normal and breath sounds normal.  Abdominal: Bowel sounds are normal. She exhibits no distension. There is no tenderness.  Musculoskeletal: Normal range of motion. She exhibits no edema and no tenderness.  Neurological: She is alert and oriented to person, place, and time. She has normal strength. No cranial nerve deficit or sensory deficit.  Skin: Skin is warm and dry. No rash noted.  Psychiatric: She has a normal mood and affect.    ED Course  Procedures (including critical care time)  Labs Reviewed - No data to display No results found.   1. URI, acute       MDM  Pt with URI vs allergies. Advised Coricidin due to HTN, other symptomatic care as needed. PCP follow. She is upset that I am not going to prescribe antibiotics. I explained multiple times that it would not help her symptoms and would put her at risk  for adverse effects and resistant bacterial infections, however she is still upset and states she 'cannot live like this'. I reassured her that symptoms will improve eventually.         Kalynne Womac B. Bernette Mayers, MD 02/21/13 1315

## 2013-02-21 NOTE — ED Notes (Signed)
Pt reports a sore throat and cold symptoms unrelieved after taking OTC medications x 2 weeks.

## 2013-05-30 ENCOUNTER — Other Ambulatory Visit: Payer: Self-pay | Admitting: Cardiology

## 2014-02-06 ENCOUNTER — Other Ambulatory Visit: Payer: Self-pay | Admitting: *Deleted

## 2014-02-06 ENCOUNTER — Encounter (INDEPENDENT_AMBULATORY_CARE_PROVIDER_SITE_OTHER): Payer: Federal, State, Local not specified - PPO

## 2014-02-06 ENCOUNTER — Encounter: Payer: Self-pay | Admitting: Nurse Practitioner

## 2014-02-06 ENCOUNTER — Ambulatory Visit (INDEPENDENT_AMBULATORY_CARE_PROVIDER_SITE_OTHER): Payer: Federal, State, Local not specified - PPO | Admitting: Nurse Practitioner

## 2014-02-06 VITALS — BP 200/120 | HR 78 | Ht 69.0 in | Wt 191.4 lb

## 2014-02-06 DIAGNOSIS — R002 Palpitations: Secondary | ICD-10-CM

## 2014-02-06 DIAGNOSIS — I4891 Unspecified atrial fibrillation: Secondary | ICD-10-CM

## 2014-02-06 DIAGNOSIS — I480 Paroxysmal atrial fibrillation: Secondary | ICD-10-CM

## 2014-02-06 DIAGNOSIS — I10 Essential (primary) hypertension: Secondary | ICD-10-CM

## 2014-02-06 LAB — BASIC METABOLIC PANEL
BUN: 21 mg/dL (ref 6–23)
CO2: 27 mEq/L (ref 19–32)
Calcium: 9.2 mg/dL (ref 8.4–10.5)
Chloride: 99 mEq/L (ref 96–112)
Creatinine, Ser: 1 mg/dL (ref 0.4–1.2)
GFR: 76.32 mL/min (ref >60.00)
Glucose, Bld: 116 mg/dL — ABNORMAL HIGH (ref 70–99)
Potassium: 3.3 mEq/L — ABNORMAL LOW (ref 3.5–5.1)
Sodium: 135 mEq/L (ref 135–145)

## 2014-02-06 LAB — CBC
HCT: 36.6 % (ref 36.0–46.0)
Hemoglobin: 12.4 g/dL (ref 12.0–15.0)
MCHC: 33.9 g/dL (ref 30.0–36.0)
MCV: 97.1 fl (ref 78.0–100.0)
Platelets: 224 10*3/uL (ref 150.0–400.0)
RBC: 3.77 Mil/uL — ABNORMAL LOW (ref 3.87–5.11)
RDW: 14.7 % — ABNORMAL HIGH (ref 11.5–14.6)
WBC: 4.3 10*3/uL — ABNORMAL LOW (ref 4.5–10.5)

## 2014-02-06 MED ORDER — DILTIAZEM HCL ER BEADS 360 MG PO CP24: ORAL_CAPSULE | ORAL | Status: DC

## 2014-02-06 MED ORDER — METOPROLOL SUCCINATE ER 50 MG PO TB24: 50.0000 mg | ORAL_TABLET | Freq: Every day | ORAL | Status: DC

## 2014-02-06 MED ORDER — TELMISARTAN-HCTZ 80-25 MG PO TABS: 1.0000 | ORAL_TABLET | Freq: Every day | ORAL | Status: DC

## 2014-02-06 MED ORDER — CLONIDINE HCL 0.1 MG PO TABS: 0.1000 mg | ORAL_TABLET | Freq: Once | ORAL | Status: DC

## 2014-02-06 MED ORDER — GUANFACINE HCL 1 MG PO TABS
1.0000 mg | ORAL_TABLET | Freq: Every day | ORAL | Status: DC
Start: 2014-02-06 — End: 2014-02-15

## 2014-02-06 NOTE — Patient Instructions (Addendum)
Go back to taking your medicines EVERY day  Monitor your blood pressure at home, keep a diary and bring your diary and your cuff when I see you back.   I have refilled your medicines today  We need to check some follow up lab today  We will arrange for an echocardiogram  We will place event monitor  I will see you in a week  Call the [facility] office at [phone] if you have any questions, problems or concerns.

## 2014-02-06 NOTE — Progress Notes (Signed)
Jill Escobar Date of Birth: 04/06/1956 Medical Record #[id number]  History of Present Illness: Jill Escobar is seen back today for a follow up visit. Former patient of Dr. Vern Claude. Has not been here since July of 2013. She has palpitations, PAF, HTN and hypokalemia as well as hyperkalemia. Her last note from Dr. Daleen Squibb mentions a past echo has been normal but I am not able to locate.  Comes in today. Here alone. Needs medicine refilled. BP sky high. Says she has taken her medicines today but when she is questioned further she tells me that she has been taking her BP medicines (except for the Tenex) every other day. Says she was told to do this by PCP. She had had a low reading at home. Has never had her cuff checked for correlation. She thinks she is having recurrent AF. Notes that several times a week she will feel her heart skipping and racing - it has woken her up at night. Tries to restrict her salt. No regular exercise program but tries to walk. Checks her pulse at times and noted irregularity. Not short of breath. No chest pain. Says BP is 130/90 on average at home.   Current Outpatient Prescriptions  Medication Sig Dispense Refill  . guanFACINE (TENEX) 1 MG tablet Take 1 mg by mouth at bedtime.      . metoprolol (TOPROL-XL) 50 MG 24 hr tablet Take 50 mg by mouth daily.        . Multiple Vitamin (MULTIVITAMIN) tablet Take 1 tablet by mouth daily.        . naproxen sodium (ANAPROX) 220 MG tablet Take 220 mg by mouth as needed.      Marland Kitchen TAZTIA XT 360 MG 24 hr capsule TAKE ONE CAPSULE BY MOUTH EVERY DAY  30 capsule  0  . telmisartan-hydrochlorothiazide (MICARDIS HCT) 80-25 MG per tablet Take 1 tablet by mouth daily.       No current facility-administered medications for this visit.    No Known Allergies  Past Medical History  Diagnosis Date  . Osteoarthritis     L hip  . HTN (hypertension)   . Palpitations     h/o  . Hypokalemia     Past Surgical History  Procedure Laterality  Date  . Vesicovaginal fistula closure w/ tah    . D&c and removal of endonetrial polyp    . Laparoscopy      with lysis of pelvic adhesions   . Drainage of cyst      follicular functional-type of cysyt- L ovary   . Uterosacral nerve ablation      with bipolar coagulation. Surgeon Dr. Jennette Kettle  . Nasal endoscopy      upper    History  Smoking status  . Former Smoker  Smokeless tobacco  . Not on file    Comment: has a 5 pack year hx. quit 4 years ago.     History  Alcohol Use  . Yes    Comment: occasional     No family history on file.  Review of Systems: The review of systems is per the HPI.  All other systems were reviewed and are negative.  Physical Exam: BP 200/120  Pulse 78  Ht 5\' 9"  (1.753 m)  Wt 191 lb 6.4 oz (86.818 kg)  BMI 28.25 kg/m2  SpO2 100% BP is 190/120 in the right arm and 200/130 in the left.  Patient is pleasant and in no acute distress. She is overweight. Weight up 6  pounds since April of 2014. Skin is warm and dry. Color is normal.  HEENT is unremarkable. Normocephalic/atraumatic. PERRL. Sclera are nonicteric. Neck is supple. No masses. No JVD. Lungs are clear. Cardiac exam shows a regular rate and rhythm. Abdomen is soft. Extremities are without edema. Gait and ROM are intact. No gross neurologic deficits noted.   Wt Readings from Last 3 Encounters:  02/06/14 191 lb 6.4 oz (86.818 kg)  02/21/13 185 lb (83.915 kg)  05/11/12 191 lb (86.637 kg)     LABORATORY DATA: EKG today shows sinus - ? Septal Q's.     Lab Results  Component Value Date   WBC 5.1 04/21/2011   HGB 14.0 04/21/2011   HCT 40.0 04/21/2011   PLT 236 04/21/2011   GLUCOSE 104* 05/23/2012   CHOL  Value: 206        ATP III CLASSIFICATION:  <200     mg/dL   Desirable  562-130200-239  mg/dL   Borderline High  >=865>=240    mg/dL   High       * 7/84/69627/10/2010   TRIG 40 05/12/2010   HDL 96 05/12/2010   LDLCALC  Value: 102        Total Cholesterol/HDL:CHD Risk Coronary Heart Disease Risk Table                      Men   Women  1/2 Average Risk   3.4   3.3  Average Risk       5.0   4.4  2 X Average Risk   9.6   7.1  3 X Average Risk  23.4   11.0        Use the calculated Patient Ratio above and the CHD Risk Table to determine the patient's CHD Risk.        ATP III CLASSIFICATION (LDL):  <100     mg/dL   Optimal  952-841100-129  mg/dL   Near or Above                    Optimal  130-159  mg/dL   Borderline  324-401160-189  mg/dL   High  >027>190     mg/dL   Very High* 2/53/66447/10/2010   ALT 13 12/15/2010   AST 19 12/15/2010   NA 138 05/23/2012   K 3.5 05/23/2012   CL 102 05/23/2012   CREATININE 0.9 05/23/2012   BUN 21 05/23/2012   CO2 29 05/23/2012   TSH 1.19 05/11/2012   INR 0.92 04/21/2011   HGBA1C  Value: 5.8 (NOTE)                                                                       According to the ADA Clinical Practice Recommendations for 2011, when HbA1c is used as a screening test:   >=6.5%   Diagnostic of Diabetes Mellitus           (if abnormal result  is confirmed)  5.7-6.4%   Increased risk of developing Diabetes Mellitus  References:Diagnosis and Classification of Diabetes Mellitus,Diabetes Care,2011,34(Suppl 1):S62-S69 and Standards of Medical Care in         Diabetes - 2011,Diabetes Care,2011,34  (Suppl 1):S11-S61.* 05/12/2010     Assessment /  Plan: 1. HTN - uncontrolled - needs to get back on a daily regimen. Explained that if she is having low readings, then we will cut the dose back. Will give clonidine 0.1 mg here in the office. Recheck baseline labs. Arrange for echo. Need to see her back in a week. She is to bring her cuff and BP diary.   2. PAF - several spells each week - not daily - will place event monitor. Check echo.   3. History of both hypo and hyper kalemia. Recheck labs today  Patient is agreeable to this plan and will call if any problems develop in the interim.   Rosalio Macadamia, RN, ANP-C [facility] [address] [place], [place]  [address] [phone]   Addendum:  BP after Clonidine and resting for one hour down to 160/100. Will proceed with our plan as outlined.

## 2014-02-08 ENCOUNTER — Other Ambulatory Visit: Payer: Self-pay | Admitting: *Deleted

## 2014-02-08 ENCOUNTER — Telehealth: Payer: Self-pay | Admitting: Nurse Practitioner

## 2014-02-08 DIAGNOSIS — E876 Hypokalemia: Secondary | ICD-10-CM

## 2014-02-08 MED ORDER — POTASSIUM CHLORIDE ER 10 MEQ PO TBCR: 10.0000 meq | EXTENDED_RELEASE_TABLET | Freq: Every day | ORAL | Status: DC

## 2014-02-08 NOTE — Telephone Encounter (Signed)
New Message:  Pt states she is calling for test results.. states it is ok to leave voice mail if she does not answer.

## 2014-02-15 ENCOUNTER — Ambulatory Visit (INDEPENDENT_AMBULATORY_CARE_PROVIDER_SITE_OTHER): Payer: Federal, State, Local not specified - PPO | Admitting: Nurse Practitioner

## 2014-02-15 ENCOUNTER — Encounter: Payer: Self-pay | Admitting: Nurse Practitioner

## 2014-02-15 VITALS — BP 180/100 | HR 56 | Ht 69.0 in | Wt 194.1 lb

## 2014-02-15 DIAGNOSIS — I4891 Unspecified atrial fibrillation: Secondary | ICD-10-CM

## 2014-02-15 DIAGNOSIS — I480 Paroxysmal atrial fibrillation: Secondary | ICD-10-CM

## 2014-02-15 DIAGNOSIS — I10 Essential (primary) hypertension: Secondary | ICD-10-CM

## 2014-02-15 MED ORDER — GUANFACINE HCL 2 MG PO TABS: 2.0000 mg | ORAL_TABLET | Freq: Every day | ORAL | Status: DC

## 2014-02-15 NOTE — Progress Notes (Signed)
Jill Escobar Date of Birth: 10/28/1956 Medical Record #[id number]  History of Present Illness: [name] is seen back today for a one week visit. Former patient of Dr. Vern Escobar's. Had not been here since July of 2013. She has palpitations, PAF, HTN and hypokalemia as well as hyperkalemia. Her last note from Dr. Daleen Escobar mentions a past echo has been normal but I am not able to locate.   Seen a week ago - needed to get her medicine refilled. BP sky high. Was taking her medicine every other day. Event monitor was placed. Echo was ordered.   Comes back today. Here alone. Echo is for Monday. She has her event monitor on. BP still labile - down to 130's but up in the 160's. High here today. She is back taking all of her medicines every day. Never had renal duplex study. Does snore. Feels refreshed in the am. Some headache today. She is pretty good about salt restriction. No chest pain. Not short of breath. No syncope.    Current Outpatient Prescriptions  Medication Sig Dispense Refill  . diltiazem (TAZTIA XT) 360 MG 24 hr capsule TAKE ONE CAPSULE BY MOUTH EVERY DAY  90 capsule  3  . guanFACINE (TENEX) 1 MG tablet Take 1 tablet (1 mg total) by mouth at bedtime.  90 tablet  3  . metoprolol succinate (TOPROL-XL) 50 MG 24 hr tablet Take 1 tablet (50 mg total) by mouth daily.  90 tablet  3  . Multiple Vitamin (MULTIVITAMIN) tablet Take 1 tablet by mouth daily.        . naproxen sodium (ANAPROX) 220 MG tablet Take 220 mg by mouth as needed.      . potassium chloride (K-DUR) 10 MEQ tablet Take 1 tablet (10 mEq total) by mouth daily.  30 tablet  9  . telmisartan-hydrochlorothiazide (MICARDIS HCT) 80-25 MG per tablet Take 1 tablet by mouth daily.  90 tablet  3   Current Facility-Administered Medications  Medication Dose Route Frequency Provider Last Rate Last Dose  . cloNIDine (CATAPRES) tablet 0.1 mg  0.1 mg Oral Once Rosalio MacadamiaLori C Jaxston Chohan, NP        No Known Allergies  Past Medical History  Diagnosis Date   . Osteoarthritis     L hip  . HTN (hypertension)   . Palpitations     h/o  . Hypokalemia     Past Surgical History  Procedure Laterality Date  . Vesicovaginal fistula closure w/ tah    . D&c and removal of endonetrial polyp    . Laparoscopy      with lysis of pelvic adhesions   . Drainage of cyst      follicular functional-type of cysyt- L ovary   . Uterosacral nerve ablation      with bipolar coagulation. Surgeon Dr. Jennette Escobar  . Nasal endoscopy      upper    History  Smoking status  . Former Smoker  Smokeless tobacco  . Not on file    Comment: has a 5 pack year hx. quit 4 years ago.     History  Alcohol Use  . Yes    Comment: occasional     History reviewed. No pertinent family history.  Review of Systems: The review of systems is per the HPI.  All other systems were reviewed and are negative.  Physical Exam: BP 180/100  Pulse 56  Ht 5\' 9"  (1.753 m)  Wt 194 lb 1.9 oz (88.052 kg)  BMI 28.65 kg/m2 BP  is 180/80 in the right arm and 190/90 in the left arm by me.  Patient is very pleasant and in no acute distress. Skin is warm and dry. Color is normal.  HEENT is unremarkable. Normocephalic/atraumatic. PERRL. Sclera are nonicteric. Neck is supple. No masses. No JVD. Lungs are clear. Cardiac exam shows a regular rate and rhythm. Soft outflow murmur noted. Abdomen is soft. Extremities are without edema. Gait and ROM are intact. No gross neurologic deficits noted.  LABORATORY DATA: Lab Results  Component Value Date   WBC 4.3* 02/06/2014   HGB 12.4 02/06/2014   HCT 36.6 02/06/2014   PLT 224.0 02/06/2014   GLUCOSE 116* 02/06/2014   CHOL  Value: 206        ATP III CLASSIFICATION:  <200     mg/dL   Desirable  161-096200-239  mg/dL   Borderline High  >=045>=240    mg/dL   High       * 4/09/81197/10/2010   TRIG 40 05/12/2010   HDL 96 05/12/2010   LDLCALC  Value: 102        Total Cholesterol/HDL:CHD Risk Coronary Heart Disease Risk Table                     Men   Women  1/2 Average Risk   3.4   3.3   Average Risk       5.0   4.4  2 X Average Risk   9.6   7.1  3 X Average Risk  23.4   11.0        Use the calculated Patient Ratio above and the CHD Risk Table to determine the patient's CHD Risk.        ATP III CLASSIFICATION (LDL):  <100     mg/dL   Optimal  147-829100-129  mg/dL   Near or Above                    Optimal  130-159  mg/dL   Borderline  562-130160-189  mg/dL   High  >865>190     mg/dL   Very High* 7/84/69627/10/2010   ALT 13 12/15/2010   AST 19 12/15/2010   NA 135 02/06/2014   K 3.3* 02/06/2014   CL 99 02/06/2014   CREATININE 1.0 02/06/2014   BUN 21 02/06/2014   CO2 27 02/06/2014   TSH 1.19 05/11/2012   INR 0.92 04/21/2011   HGBA1C  Value: 5.8 (NOTE)                                                                       According to the ADA Clinical Practice Recommendations for 2011, when HbA1c is used as a screening test:   >=6.5%   Diagnostic of Diabetes Mellitus           (if abnormal result  is confirmed)  5.7-6.4%   Increased risk of developing Diabetes Mellitus  References:Diagnosis and Classification of Diabetes Mellitus,Diabetes Care,2011,34(Suppl 1):S62-S69 and Standards of Medical Care in         Diabetes - 2011,Diabetes Care,2011,34  (Suppl 1):S11-S61.* 05/12/2010     Assessment / Plan: 1. HTN - BP not controlled - would increase the Tenex to 2mg  QHS - may need to  consider renal duplex.   2. Palpitations with past history of PAF - event monitor so far is reviewed and is benign except for a 3.9 sec pause at 2 am this am. She was asleep. May need to consider sleep apnea.  3. Murmur - for echo next week. Will also get a sense of how much LVH she has.   For echo on Monday. Arrange renal duplex. Continue to wear the event monitor. No evidence of AF so far. Not able to increase her beta blocker further due to bradycardia. Needs sleep study arranged - this may be the reason for all of her issues.   Need to get her to see one of the physicians here - will establish with Dr. Anne Fu (DOD today). I have talked with Dr.  Anne Fu who is in agreement with this plan.   Patient is agreeable to this plan and will call if any problems develop in the interim.   Rosalio Macadamia, RN, ANP-C [facility] [address] [place], [place]  [address] [phone]

## 2014-02-15 NOTE — Patient Instructions (Addendum)
Keep wearing the heart monitor.  Echo for Monday  Stay on your current medicines except increase your Tenex to 2 mg at night - this is at the drug store  We will get a renal duplex  We need to arrange a sleep study  See Dr. Anne FuSkains in 2 to 3 weeks  Call the [facility] office at [phone] if you have any questions, problems or concerns.

## 2014-02-18 ENCOUNTER — Ambulatory Visit (HOSPITAL_COMMUNITY): Payer: Federal, State, Local not specified - PPO | Attending: Cardiology | Admitting: Radiology

## 2014-02-18 ENCOUNTER — Encounter: Payer: Self-pay | Admitting: Cardiology

## 2014-02-18 DIAGNOSIS — I480 Paroxysmal atrial fibrillation: Secondary | ICD-10-CM

## 2014-02-18 DIAGNOSIS — I4891 Unspecified atrial fibrillation: Secondary | ICD-10-CM | POA: Insufficient documentation

## 2014-02-18 DIAGNOSIS — I10 Essential (primary) hypertension: Secondary | ICD-10-CM

## 2014-02-18 NOTE — Progress Notes (Signed)
Echocardiogram performed.  

## 2014-02-19 ENCOUNTER — Telehealth: Payer: Self-pay | Admitting: *Deleted

## 2014-02-19 NOTE — Telephone Encounter (Signed)
S/w  Pt Lawson FiscalLori advised pt to come in 4/23 pt aware and agreeable to plan to discuss BP and Monitor issues

## 2014-02-20 ENCOUNTER — Ambulatory Visit (INDEPENDENT_AMBULATORY_CARE_PROVIDER_SITE_OTHER): Payer: Federal, State, Local not specified - PPO | Admitting: Nurse Practitioner

## 2014-02-20 ENCOUNTER — Encounter: Payer: Self-pay | Admitting: Nurse Practitioner

## 2014-02-20 ENCOUNTER — Ambulatory Visit: Payer: Federal, State, Local not specified - PPO | Admitting: Nurse Practitioner

## 2014-02-20 VITALS — BP 140/80 | HR 48 | Ht 69.0 in | Wt 193.1 lb

## 2014-02-20 DIAGNOSIS — I480 Paroxysmal atrial fibrillation: Secondary | ICD-10-CM

## 2014-02-20 DIAGNOSIS — I10 Essential (primary) hypertension: Secondary | ICD-10-CM

## 2014-02-20 DIAGNOSIS — I4891 Unspecified atrial fibrillation: Secondary | ICD-10-CM

## 2014-02-20 MED ORDER — RIVAROXABAN 20 MG PO TABS: 20.0000 mg | ORAL_TABLET | Freq: Every day | ORAL | Status: DC

## 2014-02-20 MED ORDER — METOPROLOL SUCCINATE ER 50 MG PO TB24: 25.0000 mg | ORAL_TABLET | Freq: Every day | ORAL | Status: DC

## 2014-02-20 MED ORDER — METOPROLOL SUCCINATE ER 25 MG PO TB24: 25.0000 mg | ORAL_TABLET | Freq: Every day | ORAL | Status: DC

## 2014-02-20 NOTE — Progress Notes (Signed)
Jill Escobar Date of Birth: 10/28/1956 Medical Record #[id number]  History of Present Illness: [name] is seen back today for a work in visit. Former patient of Dr. Vern ClaudeWall's. Had not been here since July of 2013. She has palpitations, PAF, HTN and hypokalemia as well as hyperkalemia. Her last note from Dr. Daleen SquibbWall mentions a past echo has been normal but I was not able to locate.   I saw her earlier this month - needed to get her medicine refilled. BP sky high. Was taking her medicine every other day at the advice of her PCP due to labile/low readings. Event monitor was placed. Echo was ordered.   Seen last Friday to recheck her BP - still high - Tenex was increased. Had her echo earlier this week. Has an event monitor on.  Comes back today after discussion with with myself and Dr. Anne FuSkains who has agreed to see her for general cardiology. Was noted at her last visit that she had had a pause of 3.9 seconds that had occurred on the 17th at 2am - she was asleep - she does snore. He advised sleep study which has been arranged.   Came for her echo this week on Monday - noted in the report that she was in atrial fib and had a 3.0 second pause at 4pm - she was not asleep. We got her strips from Monday which show she was in atrial fib - rate of 110 at 2:30 am. Also with a mass in the left atrium - said unchanged from 2011 study - the echo I could not find. Dr. Anne FuSkains thought we could hold on further evaluation of this since it was unchanged. Called to talk with her yesterday. She noted that she had felt bad all day Monday - had to stay in bed. Dizzy. Felt BP was lower on Monday down to 115 systolic. Asked to come back today.   Here today. Here alone. Felt bad all day Monday. Presyncopal. Knew she was in atrial fib. Dizzy. Better today. Bp low on Monday - she did not take her medicines until that night. BP is better overall. No active bleeding noted. No contraindication for anticoagulation. CHADSVASC 2 for  female and HTN.   Current Outpatient Prescriptions  Medication Sig Dispense Refill  . diltiazem (TAZTIA XT) 360 MG 24 hr capsule TAKE ONE CAPSULE BY MOUTH EVERY DAY  90 capsule  3  . guanFACINE (TENEX) 2 MG tablet Take 1 tablet (2 mg total) by mouth at bedtime.  30 tablet  6  . Multiple Vitamin (MULTIVITAMIN) tablet Take 1 tablet by mouth daily.        . naproxen sodium (ANAPROX) 220 MG tablet Take 220 mg by mouth as needed.      . potassium chloride (K-DUR) 10 MEQ tablet Take 1 tablet (10 mEq total) by mouth daily.  30 tablet  9  . telmisartan-hydrochlorothiazide (MICARDIS HCT) 80-25 MG per tablet Take 1 tablet by mouth daily.  90 tablet  3  . metoprolol succinate (TOPROL XL) 25 MG 24 hr tablet Take 1 tablet (25 mg total) by mouth daily.  30 tablet  11  . rivaroxaban (XARELTO) 20 MG TABS tablet Take 1 tablet (20 mg total) by mouth daily with supper.  30 tablet  11   Current Facility-Administered Medications  Medication Dose Route Frequency Provider Last Rate Last Dose  . cloNIDine (CATAPRES) tablet 0.1 mg  0.1 mg Oral Once Rosalio MacadamiaLori C Heston Widener, NP  No Known Allergies  Past Medical History  Diagnosis Date  . Osteoarthritis     L hip  . HTN (hypertension)   . Palpitations     h/o  . Hypokalemia     Past Surgical History  Procedure Laterality Date  . Vesicovaginal fistula closure w/ tah    . D&c and removal of endonetrial polyp    . Laparoscopy      with lysis of pelvic adhesions   . Drainage of cyst      follicular functional-type of cysyt- L ovary   . Uterosacral nerve ablation      with bipolar coagulation. Surgeon Dr. Jennette Kettle  . Nasal endoscopy      upper    History  Smoking status  . Former Smoker  Smokeless tobacco  . Not on file    Comment: has a 5 pack year hx. quit 4 years ago.     History  Alcohol Use  . Yes    Comment: occasional     No family history on file.  Review of Systems: The review of systems is per the HPI.  All other systems were reviewed  and are negative.  Physical Exam: BP 140/80  Pulse 48  Ht 5\' 9"  (1.753 m)  Wt 193 lb 1.9 oz (87.599 kg)  BMI 28.51 kg/m2 Patient is very pleasant and in no acute distress. Skin is warm and dry. Color is normal.  HEENT is unremarkable. Normocephalic/atraumatic. PERRL. Sclera are nonicteric. Neck is supple. No masses. No JVD. Lungs are clear. Cardiac exam shows a regular rate and rhythm. Abdomen is soft. Extremities are without edema. Gait and ROM are intact. No gross neurologic deficits noted.  LABORATORY DATA: Lab Results  Component Value Date   WBC 4.3* 02/06/2014   HGB 12.4 02/06/2014   HCT 36.6 02/06/2014   PLT 224.0 02/06/2014   GLUCOSE 116* 02/06/2014   CHOL  Value: 206        ATP III CLASSIFICATION:  <200     mg/dL   Desirable  161-096  mg/dL   Borderline High  >=045    mg/dL   High       * 02/07/8118   TRIG 40 05/12/2010   HDL 96 05/12/2010   LDLCALC  Value: 102        Total Cholesterol/HDL:CHD Risk Coronary Heart Disease Risk Table                     Men   Women  1/2 Average Risk   3.4   3.3  Average Risk       5.0   4.4  2 X Average Risk   9.6   7.1  3 X Average Risk  23.4   11.0        Use the calculated Patient Ratio above and the CHD Risk Table to determine the patient's CHD Risk.        ATP III CLASSIFICATION (LDL):  <100     mg/dL   Optimal  147-829  mg/dL   Near or Above                    Optimal  130-159  mg/dL   Borderline  562-130  mg/dL   High  >865     mg/dL   Very High* 7/84/6962   ALT 13 12/15/2010   AST 19 12/15/2010   NA 135 02/06/2014   K 3.3* 02/06/2014   CL 99 02/06/2014  CREATININE 1.0 02/06/2014   BUN 21 02/06/2014   CO2 27 02/06/2014   TSH 1.19 05/11/2012   INR 0.92 04/21/2011   HGBA1C  Value: 5.8 (NOTE)                                                                       According to the ADA Clinical Practice Recommendations for 2011, when HbA1c is used as a screening test:   >=6.5%   Diagnostic of Diabetes Mellitus           (if abnormal result  is confirmed)  5.7-6.4%    Increased risk of developing Diabetes Mellitus  References:Diagnosis and Classification of Diabetes Mellitus,Diabetes Care,2011,34(Suppl 1):S62-S69 and Standards of Medical Care in         Diabetes - 2011,Diabetes Care,2011,34  (Suppl 1):S11-S61.* 05/12/2010   Echo Study Conclusions from April 2015  - Left ventricle: The cavity size was normal. There was mild concentric hypertrophy. Systolic function was vigorous. The estimated ejection fraction was in the range of 65% to 70%. Wall motion was normal; there were no regional wall motion abnormalities. The study was not technically sufficient to allow evaluation of LV diastolic dysfunction due to atrial fibrillation. - Aortic valve: Trileaflet; normal thickness leaflets. No regurgitation. - Mitral valve: Mild regurgitation. - Left atrium: There is highly echogenic structure attached to the inferior left atrial wall measuring 20 x 9 mm. It is unchanged from the prior echocardiogram in 2011. The atrium was mildly dilated. - Right ventricle: Systolic function was normal. - Right atrium: The atrium was normal in size. - Tricuspid valve: Mild regurgitation. - Pulmonary arteries: Systolic pressure was within the normal range. - Inferior vena cava: The vessel was normal in size.    Assessment / Plan: 1. HTN - BP has improved with current regimen. She will continue to monitor.   2. Palpitations with past history of PAF - Event monitor has shows atrial fib - rate of 110 with recurrent 3 second pause in the afternoon - she has symptomatic AF - discussed with Dr. Johney FrameAllred - will cut the Metoprolol back. He feels she is probably going to need some type of antiarrhythmic therapy for suppression. Hold on PPM for now. She is to keep wearing the monitor. He is also in agreement with anticoagulation. Will start Xarelto 20 mg each evening. See back after her event monitor is complete with Dr. Anne FuSkains. She is also going to have sleep study performed and will be  seeing Dr. Anne FuSkains after that study is complete.   3. Abnormal echo - this was reviewed with Dr. Anne FuSkains - it is noted to be unchanged. Will defer further evaluation to him if needed.  Patient is agreeable to this plan and will call if any problems develop in the interim.  Rosalio MacadamiaLori C. Samanyu Tinnell, RN, ANP-C  [facility]  [address]  [place], [place] [address]  [phone]

## 2014-02-20 NOTE — Patient Instructions (Signed)
Limit your use of Naproxen, Advil, Alleve, etc - use Tylenol instead  Start Xarelto 20 mg each evening - take with your largest meal  Cut your metoprolol back to just a half a tablet each day  Continue to monitor your blood pressure  See me the week after you finish with your monitor on a day that Dr. Anne FuSkains is here  Stay on all your other medicines  Call the [facility] office at [phone] if you have any questions, problems or concerns.    Xarelto (Rivaroxaban) oral tablets What is this medicine? RIVAROXABAN (ri va ROX a ban) is an anticoagulant (blood thinner). It is used to treat blood clots in the lungs or in the veins. It is also used after knee or hip surgeries to prevent blood clots. It is also used to lower the chance of stroke in people with a medical condition called atrial fibrillation. This medicine may be used for other purposes; ask your health care provider or pharmacist if you have questions. COMMON BRAND NAME(S): Xarelto What should I tell my health care provider before I take this medicine? They need to know if you have any of these conditions: -bleeding disorders -bleeding in the brain -blood in your stools (black or tarry stools) or if you have blood in your vomit -history of stomach bleeding -kidney disease -liver disease -low blood counts, like low white cell, platelet, or red cell counts -recent or planned spinal or epidural procedure -take medicines that treat or prevent blood clots -an unusual or allergic reaction to rivaroxaban, other medicines, foods, dyes, or preservatives -pregnant or trying to get pregnant -breast-feeding How should I use this medicine? Take this medicine by mouth with a glass of water. Follow the directions on the prescription label. Take your medicine at regular intervals. Do not take it more often than directed. Do not stop taking except on your doctor's advice. Stopping this medicine may increase your  risk of a blot clot. Be sure to refill your prescription before you run out of medicine. If you are taking this medicine after hip or knee replacement surgery, take it with or without food. If you are taking this medicine for atrial fibrillation, take it with your evening meal. If you are taking this medicine to treat blood clots, take it with food at the same time each day. If you are unable to swallow your tablet, you may crush the tablet and mix it in applesauce. Then, immediately eat the applesauce. You should eat more food right after you eat the applesauce containing the crushed tablet. Talk to your pediatrician regarding the use of this medicine in children. Special care may be needed. Overdosage: If you think you have taken too much of this medicine contact a poison control center or emergency room at once. NOTE: This medicine is only for you. Do not share this medicine with others. What if I miss a dose? If you take your medicine once a day and miss a dose, take the missed dose as soon as you remember. If you take your medicine twice a day and miss a dose, take the missed dose immediately. In this instance, 2 tablets may be taken at the same time. The next day you should take 1 tablet twice a day as directed. What may interact with this medicine? -aspirin and aspirin-like medicines -certain antibiotics like erythromycin, azithromycin, and clarithromycin -certain medicines for fungal infections like ketoconazole and itraconazole -certain medicines for irregular heart beat like amiodarone,  quinidine, dronedarone -certain medicines for seizures like carbamazepine, phenytoin -certain medicines that treat or prevent blood clots like warfarin, enoxaparin, and dalteparin  -conivaptan -diltiazem -felodipine -indinavir -lopinavir; ritonavir -NSAIDS, medicines for pain and inflammation, like ibuprofen or naproxen -ranolazine -rifampin -ritonavir -St. John's wort -verapamil This list may not  describe all possible interactions. Give your health care provider a list of all the medicines, herbs, non-prescription drugs, or dietary supplements you use. Also tell them if you smoke, drink alcohol, or use illegal drugs. Some items may interact with your medicine. What should I watch for while using this medicine? Visit your doctor or health care professional for regular checks on your progress. Your condition will be monitored carefully while you are receiving this medicine. Notify your doctor or health care professional and seek emergency treatment if you develop breathing problems; changes in vision; chest pain; severe, sudden headache; pain, swelling, warmth in the leg; trouble speaking; sudden numbness or weakness of the face, arm, or leg. These can be signs that your condition has gotten worse. If you are going to have surgery, tell your doctor or health care professional that you are taking this medicine. Tell your health care professional that you use this medicine before you have a spinal or epidural procedure. Sometimes people who take this medicine have bleeding problems around the spine when they have a spinal or epidural procedure. This bleeding is very rare. If you have a spinal or epidural procedure while on this medicine, call your health care professional immediately if you have back pain, numbness or tingling (especially in your legs and feet), muscle weakness, paralysis, or loss of bladder or bowel control. Avoid sports and activities that might cause injury while you are using this medicine. Severe falls or injuries can cause unseen bleeding. Be careful when using sharp tools or knives. Consider using an Neurosurgeonelectric razor. Take special care brushing or flossing your teeth. Report any injuries, bruising, or red spots on the skin to your doctor or health care professional. What side effects may I notice from receiving this medicine? Side effects that you should report to your doctor or  health care professional as soon as possible: -allergic reactions like skin rash, itching or hives, swelling of the face, lips, or tongue -back pain -redness, blistering, peeling or loosening of the skin, including inside the mouth -signs and symptoms of bleeding such as bloody or black, tarry stools; red or dark-brown urine; spitting up blood or brown material that looks like coffee grounds; red spots on the skin; unusual bruising or bleeding from the eye, gums, or nose  Side effects that usually do not require medical attention (Report these to your doctor or health care professional if they continue or are bothersome.): -dizziness -muscle pain This list may not describe all possible side effects. Call your doctor for medical advice about side effects. You may report side effects to FDA at 1-800-FDA-1088. Where should I keep my medicine? Keep out of the reach of children. Store at room temperature between 15 and 30 degrees C (59 and 86 degrees F). Throw away any unused medicine after the expiration date. NOTE: This sheet is a summary. It may not cover all possible information. If you have questions about this medicine, talk to your doctor, pharmacist, or health care provider.  2014, Elsevier/Gold Standard. (2013-04-04 09:51:31)

## 2014-02-21 ENCOUNTER — Ambulatory Visit (HOSPITAL_COMMUNITY): Payer: Federal, State, Local not specified - PPO | Attending: Nurse Practitioner | Admitting: *Deleted

## 2014-02-21 DIAGNOSIS — I10 Essential (primary) hypertension: Secondary | ICD-10-CM | POA: Insufficient documentation

## 2014-02-21 DIAGNOSIS — I4891 Unspecified atrial fibrillation: Secondary | ICD-10-CM

## 2014-02-21 NOTE — Progress Notes (Signed)
Visceral renal duplex complete 

## 2014-03-13 ENCOUNTER — Ambulatory Visit (INDEPENDENT_AMBULATORY_CARE_PROVIDER_SITE_OTHER): Payer: Federal, State, Local not specified - PPO | Admitting: Nurse Practitioner

## 2014-03-13 ENCOUNTER — Encounter: Payer: Self-pay | Admitting: Nurse Practitioner

## 2014-03-13 VITALS — BP 152/94 | HR 54 | Ht 69.0 in | Wt 187.4 lb

## 2014-03-13 DIAGNOSIS — I4891 Unspecified atrial fibrillation: Secondary | ICD-10-CM

## 2014-03-13 DIAGNOSIS — Z79899 Other long term (current) drug therapy: Secondary | ICD-10-CM

## 2014-03-13 DIAGNOSIS — I10 Essential (primary) hypertension: Secondary | ICD-10-CM

## 2014-03-13 DIAGNOSIS — I480 Paroxysmal atrial fibrillation: Secondary | ICD-10-CM

## 2014-03-13 NOTE — Progress Notes (Signed)
Jill Escobar Date of Birth: Mar 15, 1956 Medical Record #[id number]  History of Present Illness: [name] is seen back today for a work in visit. Former patient of Dr. Vern ClaudeWall's. Had not been here since July of 2013. She has palpitations, PAF, HTN and hypokalemia as well as hyperkalemia. Her last note from Dr. Daleen SquibbWall mentions a past echo has been normal but I was not able to locate.   I saw her in early April - needed to get her medicine refilled. BP sky high. Was taking her medicine every other day at the advice of her PCP due to labile/low readings. Event monitor was placed. Echo was ordered. Follow up visit showed her BP to still be high - Tenex was increased.   Seen 3 weeks ago for discussion with with myself and Dr. Anne FuSkains who has agreed to see her for general cardiology. Was noted on her event monitor that she had had a pause of 3.9 seconds that had occurred on the 17th at 2am - she was asleep - she does snore. He advised sleep study which is planned for next week.  When she came for her echo she was in atrial fib and had a 3.0 second pause at 4pm - she was not asleep. We got her strips from Monday which show she was in atrial fib - rate of 110 at 2:30 am. Also with a mass in the left atrium - said unchanged from 2011 study - the echo I could not find. Dr. Anne FuSkains thought we could hold on further evaluation of this since it was unchanged. At her last visit, we started Xarelto. I talked with Dr. Johney FrameAllred - he advised cutting the metoprolol back and felt like she is going to need some type of antiarrhythmic therapy to hold in NSR.   Comes back today. Here alone. Has finished her event monitor. To my review it looks like her rhythm was ok since her last visit here on 4/22. Monitor was taken off 03/07/14. She feels like she is still having atrial fib - feels tired. For sleep study next week. Sees Dr. Anne FuSkains later this month. BP running 130/90's at home. Renal duplex was normal. She is working long hours.  Her biggest complaint is the atrial fib. She has had no problems with the Xarelto.   Current Outpatient Prescriptions  Medication Sig Dispense Refill  . clotrimazole-betamethasone (LOTRISONE) cream       . diltiazem (TAZTIA XT) 360 MG 24 hr capsule TAKE ONE CAPSULE BY MOUTH EVERY DAY  90 capsule  3  . guanFACINE (TENEX) 2 MG tablet Take 1 tablet (2 mg total) by mouth at bedtime.  30 tablet  6  . KLOR-CON M10 10 MEQ tablet       . metoprolol succinate (TOPROL XL) 25 MG 24 hr tablet Take 1 tablet (25 mg total) by mouth daily.  30 tablet  11  . Multiple Vitamin (MULTIVITAMIN) tablet Take 1 tablet by mouth daily.        . potassium chloride (K-DUR) 10 MEQ tablet Take 1 tablet (10 mEq total) by mouth daily.  30 tablet  9  . rivaroxaban (XARELTO) 20 MG TABS tablet Take 1 tablet (20 mg total) by mouth daily with supper.  30 tablet  11  . telmisartan-hydrochlorothiazide (MICARDIS HCT) 80-25 MG per tablet Take 1 tablet by mouth daily.  90 tablet  3   Current Facility-Administered Medications  Medication Dose Route Frequency Provider Last Rate Last Dose  . cloNIDine (CATAPRES) tablet  0.1 mg  0.1 mg Oral Once Rosalio Macadamia, NP        No Known Allergies  Past Medical History  Diagnosis Date  . Osteoarthritis     L hip  . HTN (hypertension)   . Palpitations     h/o  . Hypokalemia     Past Surgical History  Procedure Laterality Date  . Vesicovaginal fistula closure w/ tah    . D&c and removal of endonetrial polyp    . Laparoscopy      with lysis of pelvic adhesions   . Drainage of cyst      follicular functional-type of cysyt- L ovary   . Uterosacral nerve ablation      with bipolar coagulation. Surgeon Dr. Jennette Kettle  . Nasal endoscopy      upper    History  Smoking status  . Former Smoker  Smokeless tobacco  . Not on file    Comment: has a 5 pack year hx. quit 4 years ago.     History  Alcohol Use  . Yes    Comment: occasional     History reviewed. No pertinent family  history.  Review of Systems: The review of systems is per the HPI.  All other systems were reviewed and are negative.  Physical Exam: BP 152/94  Pulse 54  Ht 5\' 9"  (1.753 m)  Wt 187 lb 6.4 oz (85.004 kg)  BMI 27.66 kg/m2 BP 140/90 by me.  Patient is very pleasant and in no acute distress. Skin is warm and dry. Color is normal.  HEENT is unremarkable. Normocephalic/atraumatic. PERRL. Sclera are nonicteric. Neck is supple. No masses. No JVD. Lungs are clear. Cardiac exam shows a regular rate and rhythm. She has an outflow murmur noted. Abdomen is soft. Extremities are without edema. Gait and ROM are intact. No gross neurologic deficits noted.  LABORATORY DATA:  Lab Results  Component Value Date   WBC 4.3* 02/06/2014   HGB 12.4 02/06/2014   HCT 36.6 02/06/2014   PLT 224.0 02/06/2014   GLUCOSE 116* 02/06/2014   CHOL  Value: 206        ATP III CLASSIFICATION:  <200     mg/dL   Desirable  161-096  mg/dL   Borderline High  >=045    mg/dL   High       * 02/07/8118   TRIG 40 05/12/2010   HDL 96 05/12/2010   LDLCALC  Value: 102        Total Cholesterol/HDL:CHD Risk Coronary Heart Disease Risk Table                     Men   Women  1/2 Average Risk   3.4   3.3  Average Risk       5.0   4.4  2 X Average Risk   9.6   7.1  3 X Average Risk  23.4   11.0        Use the calculated Patient Ratio above and the CHD Risk Table to determine the patient's CHD Risk.        ATP III CLASSIFICATION (LDL):  <100     mg/dL   Optimal  147-829  mg/dL   Near or Above                    Optimal  130-159  mg/dL   Borderline  562-130  mg/dL   High  >865     mg/dL  Very High* 05/12/2010   ALT 13 12/15/2010   AST 19 12/15/2010   NA 135 02/06/2014   K 3.3* 02/06/2014   CL 99 02/06/2014   CREATININE 1.0 02/06/2014   BUN 21 02/06/2014   CO2 27 02/06/2014   TSH 1.19 05/11/2012   INR 0.92 04/21/2011   HGBA1C  Value: 5.8 (NOTE)                                                                       According to the ADA Clinical Practice Recommendations  for 2011, when HbA1c is used as a screening test:   >=6.5%   Diagnostic of Diabetes Mellitus           (if abnormal result  is confirmed)  5.7-6.4%   Increased risk of developing Diabetes Mellitus  References:Diagnosis and Classification of Diabetes Mellitus,Diabetes Care,2011,34(Suppl 1):S62-S69 and Standards of Medical Care in         Diabetes - 2011,Diabetes Care,2011,34  (Suppl 1):S11-S61.* 05/12/2010    Echo Study Conclusions from April 2015  - Left ventricle: The cavity size was normal. There was mild concentric hypertrophy. Systolic function was vigorous. The estimated ejection fraction was in the range of 65% to 70%. Wall motion was normal; there were no regional wall motion abnormalities. The study was not technically sufficient to allow evaluation of LV diastolic dysfunction due to atrial fibrillation. - Aortic valve: Trileaflet; normal thickness leaflets. No regurgitation. - Mitral valve: Mild regurgitation. - Left atrium: There is highly echogenic structure attached to the inferior left atrial wall measuring 20 x 9 mm. It is unchanged from the prior echocardiogram in 2011. The atrium was mildly dilated. - Right ventricle: Systolic function was normal. - Right atrium: The atrium was normal in size. - Tricuspid valve: Mild regurgitation. - Pulmonary arteries: Systolic pressure was within the normal range. - Inferior vena cava: The vessel was normal in size.  Assessment / Plan:  1. HTN - on multiple medicines. May need to consider adding aldactone on return. Will see how the sleep study turns out and if CPAP is needed, how that impacts her readings.   2. Palpitations with past history of PAF - Event monitor has shown atrial fib - rate of 110 with recurrent 3 second pause in the afternoon - she has symptomatic AF - now on less Metoprolol due to the pauses. Most likely will need some type of antiarrhythmic therapy for suppression. Hold on PPM for now. For sleep study next week.  Seeing Dr. Anne FuSkains in follow up.  3. Abnormal echo - this has been reviewed with Dr. Anne FuSkains - it is noted to be unchanged. Will defer further evaluation to him if needed.   Patient is agreeable to this plan and will call if any problems develop in the interim.  Rosalio MacadamiaLori C. Holland Kotter, RN, ANP-C  [facility]  [address]  [place], [place] [address]  [phone]

## 2014-03-13 NOTE — Patient Instructions (Signed)
Stay on your current medicines for now  Proceed with your sleep study next week  See Dr. Anne FuSkains as planned  Call the [facility] office at [phone] if you have any questions, problems or concerns.

## 2014-03-14 ENCOUNTER — Institutional Professional Consult (permissible substitution): Payer: Federal, State, Local not specified - PPO | Admitting: Cardiology

## 2014-03-18 ENCOUNTER — Ambulatory Visit (HOSPITAL_BASED_OUTPATIENT_CLINIC_OR_DEPARTMENT_OTHER): Payer: Federal, State, Local not specified - PPO | Attending: Nurse Practitioner

## 2014-03-18 VITALS — Ht 68.0 in | Wt 180.0 lb

## 2014-03-18 DIAGNOSIS — I10 Essential (primary) hypertension: Secondary | ICD-10-CM

## 2014-03-18 DIAGNOSIS — I4891 Unspecified atrial fibrillation: Secondary | ICD-10-CM

## 2014-03-18 DIAGNOSIS — G4733 Obstructive sleep apnea (adult) (pediatric): Secondary | ICD-10-CM | POA: Insufficient documentation

## 2014-03-18 DIAGNOSIS — I480 Paroxysmal atrial fibrillation: Secondary | ICD-10-CM

## 2014-03-28 ENCOUNTER — Encounter: Payer: Self-pay | Admitting: Cardiology

## 2014-03-28 ENCOUNTER — Ambulatory Visit (INDEPENDENT_AMBULATORY_CARE_PROVIDER_SITE_OTHER): Payer: Federal, State, Local not specified - PPO | Admitting: Cardiology

## 2014-03-28 VITALS — BP 128/82 | HR 58 | Ht 68.0 in | Wt 189.0 lb

## 2014-03-28 DIAGNOSIS — Z7901 Long term (current) use of anticoagulants: Secondary | ICD-10-CM

## 2014-03-28 DIAGNOSIS — I10 Essential (primary) hypertension: Secondary | ICD-10-CM

## 2014-03-28 DIAGNOSIS — I4891 Unspecified atrial fibrillation: Secondary | ICD-10-CM

## 2014-03-28 DIAGNOSIS — Z5181 Encounter for therapeutic drug level monitoring: Secondary | ICD-10-CM

## 2014-03-28 DIAGNOSIS — Z79899 Other long term (current) drug therapy: Secondary | ICD-10-CM

## 2014-03-28 DIAGNOSIS — I480 Paroxysmal atrial fibrillation: Secondary | ICD-10-CM

## 2014-03-28 MED ORDER — FLECAINIDE ACETATE 50 MG PO TABS
50.0000 mg | ORAL_TABLET | Freq: Two times a day (BID) | ORAL | Status: DC
Start: 2014-03-28 — End: 2014-12-27

## 2014-03-28 NOTE — Progress Notes (Signed)
[address] [place], [place]  [address] Phone: [phone] Fax:  [phone]  Date:  03/28/2014   ID:  Jill Escobar, DOB October 22, 1956, MRN [id number]  PCP:  Georgianne FickAMACHANDRAN,AJITH, MD   History of Present Illness: Jill Escobar is a 58 y.o. female previously seen by Jill Escobar here for the evaluation of 4 second pause, paroxysmal atrial fibrillation seen on event monitor. She also has hypertension.  She is a former patient of Jill Escobar's. She was seen back in April of 2015 to get her medications refilled and her blood pressures were highly elevated at that time. She had an event monitor which showed a pause of 3.9 seconds occurred at 2 AM while sleeping, snoring, advised sleep study. Atrial fibrillation, 3 second pause noted at 4 PM while awake. Asymptomatic. There was a possible mass in the left atrium which is unchanged from 2011. Anticoagulation advised.  Jill Escobar discussed with Jill Escobar and advise cutting the Toprol back and felt like she was going to need some type of antiarrhythmic therapy to hold sinus rhythm.  Renal duplex was normal. Working long hours. Main complaint is fatigue which she says is associated with atrial fibrillation. There were periods on the monitor where she may have felt fatigued however she was in normal rhythm.     Wt Readings from Last 3 Encounters:  03/28/14 189 lb (85.73 kg)  03/18/14 180 lb (81.647 kg)  03/13/14 187 lb 6.4 oz (85.004 kg)     Past Medical History  Diagnosis Date  . Osteoarthritis     L hip  . HTN (hypertension)   . Palpitations     h/o  . Hypokalemia     Past Surgical History  Procedure Laterality Date  . Vesicovaginal fistula closure w/ tah    . D&c and removal of endonetrial polyp    . Laparoscopy      with lysis of pelvic adhesions   . Drainage of cyst      follicular functional-type of cysyt- L ovary   . Uterosacral nerve ablation      with bipolar coagulation. Surgeon Dr. Jennette KettleNeal  . Nasal  endoscopy      upper    Current Outpatient Prescriptions  Medication Sig Dispense Refill  . clotrimazole-betamethasone (LOTRISONE) cream       . diltiazem (TAZTIA XT) 360 MG 24 hr capsule TAKE ONE CAPSULE BY MOUTH EVERY DAY  90 capsule  3  . guanFACINE (TENEX) 2 MG tablet Take 1 tablet (2 mg total) by mouth at bedtime.  30 tablet  6  . KLOR-CON M10 10 MEQ tablet       . metoprolol succinate (TOPROL XL) 25 MG 24 hr tablet Take 1 tablet (25 mg total) by mouth daily.  30 tablet  11  . Multiple Vitamin (MULTIVITAMIN) tablet Take 1 tablet by mouth daily.        . potassium chloride (K-DUR) 10 MEQ tablet Take 1 tablet (10 mEq total) by mouth daily.  30 tablet  9  . rivaroxaban (XARELTO) 20 MG TABS tablet Take 1 tablet (20 mg total) by mouth daily with supper.  30 tablet  11  . telmisartan-hydrochlorothiazide (MICARDIS HCT) 80-25 MG per tablet Take 1 tablet by mouth daily.  90 tablet  3   Current Facility-Administered Medications  Medication Dose Route Frequency Provider Last Rate Last Dose  . cloNIDine (CATAPRES) tablet 0.1 mg  0.1 mg Oral Once Jill MacadamiaLori C Gerhardt, NP  Allergies:   No Known Allergies  Social History:  The patient  reports that she has quit smoking. She does not have any smokeless tobacco history on file. She reports that she drinks alcohol. She reports that she does not use illicit drugs.   No family history on file.  ROS:  Please see the history of present illness.   Positive for fatigue. Denies any fevers, chills, strokelike symptoms, orthopnea, PND, bleeding   All other systems reviewed and negative.   PHYSICAL EXAM: VS:  BP 128/82  Pulse 58  Ht 5\' 8"  (1.727 m)  Wt 189 lb (85.73 kg)  BMI 28.74 kg/m2 Well nourished, well developed, in no acute distress HEENT: normal, [place]/AT, EOMI Neck: no JVD, normal carotid upstroke, no bruit Cardiac:  normal S1, S2; RRR; no murmur Lungs:  clear to auscultation bilaterally, no wheezing, rhonchi or rales Abd: soft, nontender, no  hepatomegaly, no bruits Ext: no edema, 2+ distal pulses Skin: warm and dry GU: deferred Neuro: no focal abnormalities noted, AAO x 3  EKG:   ECHO: 02/18/14: - Left ventricle: The cavity size was normal. There was mild concentric hypertrophy. Systolic function was vigorous. The estimated ejection fraction was in the range of 65% to 70%. Wall motion was normal; there were no regional wall motion abnormalities. The study was not technically sufficient to allow evaluation of LV diastolic dysfunction due to atrial fibrillation. - Aortic valve: Trileaflet; normal thickness leaflets. No regurgitation. - Mitral valve: Mild regurgitation. - Left atrium: There is highly echogenic structure attached to the inferior left atrial wall measuring 20 x 9 mm. It is unchanged from the prior echocardiogram in 2011. The atrium was mildly dilated. - Right ventricle: Systolic function was normal. - Right atrium: The atrium was normal in size. - Tricuspid valve: Mild regurgitation. - Pulmonary arteries: Systolic pressure was within the normal range. - Inferior vena cava: The vessel was normal in size.     ASSESSMENT AND PLAN:  1. Paroxysmal atrial fibrillation with occasional pause-I agree with Dr. Johney Frame that we should trial antiarrhythmic therapy. With only mild LVH and normal ejection fraction, it would not be unreasonable to utilize flecainide. I starting, we will proceed with treadmill test to ensure that she does not have any adverse arrhythmias. Starting 50 mg twice a day. We can increase to 100 mg twice a day as necessary. We may need to pull back on diltiazem as well if bradycardia becomes too much of an issue. We will see her back in 2-3 weeks, EKG. We had lengthy discussion about atrial fibrillation, physiology, success rates with medications/ablations, lifestyle modification. I encouraged her to continue working. If necessary in the future, we can have formal consultation with Dr. Johney Frame.  Continuing encouragement. 2. Blood pressure-excellent control currently. 3. 4 second pause-Toprol had been stopped. Sleep study had been performed. Awaiting results. Possible vagal phenomenon. No syncope. 4. Chronic anticoagulation-continue. Expressed the importance in stroke prevention.  Signed, Donato Schultz, MD [facility]  03/28/2014 11:12 AM

## 2014-03-28 NOTE — Patient Instructions (Addendum)
Your physician has recommended you make the following change in your medication:   1. Stop Metoprolol.   2. Start Flecainide 50 mg 1 tablet twice a day.   Your physician has requested that you have an exercise tolerance test in 2 weeks. For further information please visit [email or web address] Please also follow instruction sheet, as given.  Your physician recommends that you schedule a follow-up appointment in: 2 weeks with Norma Fredrickson, Tereso Newcomer, PA, or Herma Carson.

## 2014-04-04 DIAGNOSIS — G4730 Sleep apnea, unspecified: Secondary | ICD-10-CM

## 2014-04-04 DIAGNOSIS — G4710 Hypersomnia, unspecified: Secondary | ICD-10-CM

## 2014-04-04 NOTE — Sleep Study (Signed)
   NAME: Jill Escobar DATE OF BIRTH:  01/19/1956 MEDICAL RECORD NUMBER [id number]  LOCATION: [facility]  PHYSICIAN: Maree Krabbe Alexiya Franqui  DATE OF STUDY: 03/18/2014  SLEEP STUDY TYPE: Nocturnal Polysomnogram               REFERRING PHYSICIAN: Rosalio Macadamia, NP  INDICATION FOR STUDY: Hypersomnia with sleep apnea  EPWORTH SLEEPINESS SCORE:  4 HEIGHT: 5\' 8"  (172.7 cm)  WEIGHT: 180 lb (81.647 kg)    Body mass index is 27.38 kg/(m^2).  NECK SIZE: 15 in.  MEDICATIONS: Reviewed in the sleep record  SLEEP ARCHITECTURE: The patient had a total sleep time of 321 minutes with no slow-wave sleep and only 4 minutes of REM. Sleep onset latency was normal at 26 minutes and REM onset was prolonged at 231 minutes. Sleep efficiency was mildly reduced at 86%.  RESPIRATORY DATA: The patient was found to have 4 apneas and 34 obstructive hypopneas, giving her an AHI of 7 events per hour. The events occurred in all body positions and there was moderate to loud snoring noted throughout.  OXYGEN DATA: There was oxygen desaturation as low as 92% with the patient's obstructive events  CARDIAC DATA: No clinically significant arrhythmias were noted.  MOVEMENT/PARASOMNIA: No periodic limb movements were seen, or abnormal behaviors.  IMPRESSION/ RECOMMENDATION:    1)  mild obstructive sleep apnea/hypopnea syndrome, with an AHI of 7 events per hour and oxygen desaturation transiently as low as 92%. The patient had very little REM during the night, and therefore her degree of sleep apnea may be underestimated. Treatment for this degree of sleep apnea can include a trial of weight loss alone, upper airway surgery, dental appliance, and also CPAP. Clinical correlation is suggested.     Barbaraann Share Diplomate, American Board of Sleep Medicine  ELECTRONICALLY SIGNED ON:  04/04/2014, 12:45 PM  [facility] PH: [phone]   FX: [phone] ACCREDITED BY THE  AMERICAN ACADEMY OF SLEEP MEDICINE

## 2014-04-08 ENCOUNTER — Other Ambulatory Visit: Payer: Self-pay | Admitting: *Deleted

## 2014-04-08 ENCOUNTER — Telehealth: Payer: Self-pay | Admitting: *Deleted

## 2014-04-08 DIAGNOSIS — G4730 Sleep apnea, unspecified: Secondary | ICD-10-CM

## 2014-04-08 NOTE — Telephone Encounter (Signed)
S/w pt is aware our office will be calling pt with a appointment for Dr. Teddy Spike office in the afternoon

## 2014-04-09 ENCOUNTER — Ambulatory Visit (HOSPITAL_COMMUNITY)
Admission: RE | Admit: 2014-04-09 | Discharge: 2014-04-09 | Disposition: A | Discharge status: Home / Self Care | Payer: Federal, State, Local not specified - PPO | Source: Ambulatory Visit | Attending: Cardiology | Admitting: Cardiology

## 2014-04-09 DIAGNOSIS — Z5181 Encounter for therapeutic drug level monitoring: Secondary | ICD-10-CM | POA: Insufficient documentation

## 2014-04-09 DIAGNOSIS — R5383 Other fatigue: Secondary | ICD-10-CM

## 2014-04-09 DIAGNOSIS — R5381 Other malaise: Secondary | ICD-10-CM | POA: Insufficient documentation

## 2014-04-09 DIAGNOSIS — Z79899 Other long term (current) drug therapy: Secondary | ICD-10-CM

## 2014-04-09 NOTE — Progress Notes (Signed)
OP GXT. Pt exercised 5:30 of Bruce. Test stopped secondary to fatigue. Max HR 169, > 100% of predicted max HR (164). Max B/P 264/104. Baseline EKG NSR without acute changes, with stress difficult to read secondary to artifact but there does not appear to be any significant ST changes. No chest pain and no arrythmia noted. B/P improved in recovery but still elevated, 174/94.  Corine Shelter PA-C 04/09/2014 11:07 AM

## 2014-04-18 ENCOUNTER — Ambulatory Visit: Payer: Federal, State, Local not specified - PPO | Admitting: Cardiology

## 2014-05-01 ENCOUNTER — Institutional Professional Consult (permissible substitution): Payer: Federal, State, Local not specified - PPO | Admitting: Pulmonary Disease

## 2014-07-19 ENCOUNTER — Other Ambulatory Visit: Payer: Self-pay | Admitting: Internal Medicine

## 2014-07-19 DIAGNOSIS — R1084 Generalized abdominal pain: Secondary | ICD-10-CM

## 2014-07-30 ENCOUNTER — Ambulatory Visit
Admission: RE | Admit: 2014-07-30 | Discharge: 2014-07-30 | Disposition: A | Discharge status: Home / Self Care | Payer: Federal, State, Local not specified - PPO | Source: Ambulatory Visit | Attending: Internal Medicine | Admitting: Internal Medicine

## 2014-07-30 DIAGNOSIS — R1084 Generalized abdominal pain: Secondary | ICD-10-CM

## 2014-07-30 MED ORDER — IOHEXOL 300 MG/ML  SOLN
100.0000 mL | Freq: Once | INTRAMUSCULAR | Status: AC | PRN
  Administered 2014-07-30: 100 mL via INTRAVENOUS

## 2014-12-27 ENCOUNTER — Other Ambulatory Visit: Payer: Self-pay | Admitting: Cardiology

## 2015-02-04 ENCOUNTER — Encounter: Payer: Self-pay | Admitting: Cardiology

## 2015-02-04 ENCOUNTER — Ambulatory Visit (INDEPENDENT_AMBULATORY_CARE_PROVIDER_SITE_OTHER): Payer: Federal, State, Local not specified - PPO | Admitting: Cardiology

## 2015-02-04 VITALS — BP 150/98 | HR 67 | Ht 68.0 in | Wt 195.0 lb

## 2015-02-04 DIAGNOSIS — Z8679 Personal history of other diseases of the circulatory system: Secondary | ICD-10-CM

## 2015-02-04 DIAGNOSIS — I10 Essential (primary) hypertension: Secondary | ICD-10-CM | POA: Diagnosis not present

## 2015-02-04 DIAGNOSIS — I480 Paroxysmal atrial fibrillation: Secondary | ICD-10-CM | POA: Diagnosis not present

## 2015-02-04 MED ORDER — FLECAINIDE ACETATE 100 MG PO TABS: 100.0000 mg | ORAL_TABLET | Freq: Two times a day (BID) | ORAL | Status: DC

## 2015-02-04 MED ORDER — RIVAROXABAN 20 MG PO TABS: 20.0000 mg | ORAL_TABLET | Freq: Every day | ORAL | Status: DC

## 2015-02-04 NOTE — Patient Instructions (Signed)
Please increase Flecainide to 100 mg twice a day. Continue all other medications as listed.  Follow up in 1 month with Dr Anne FuSkains.  Thank you for choosing [facility]!!

## 2015-02-04 NOTE — Progress Notes (Signed)
[address] Jill [place], [place]  [address] Phone: [phone] Fax:  [phone]  Date:  02/04/2015   ID:  Jill Escobar, DOB 09-Nov-1955, MRN [id number]  PCP:  Jill Fick, MD   History of Present Illness: Jill Escobar is a 59 y.o. female previously seen by Jill Escobar here for the evaluation of 4 second pause, paroxysmal atrial fibrillation seen on event monitor. She also has hypertension.  She is a former patient of Dr. Vern Escobar. She was seen back in April of 2015 to get her medications refilled and her blood pressures were highly elevated at that time. She had an event monitor which showed a pause of 3.9 seconds occurred at 2 AM while sleeping, snoring, advised sleep study. Atrial fibrillation, 3 second pause noted at 4 PM while awake. Asymptomatic. There was a possible mass in the left atrium which is unchanged from 2011. Anticoagulation advised.  Jill Escobar discussed with Dr. Johney Escobar and advise cutting the Toprol back and felt like she was going to need some type of antiarrhythmic therapy to hold sinus rhythm.  Renal duplex was normal. Working long hours. Main complaint is fatigue which she says is associated with atrial fibrillation. There were periods on the monitor where she may have felt fatigued however she was in normal rhythm.  02/04/15 - had another bad episode after Easter. This was on Flecainide 50 BID. Thinks long day triggered it. Episode lasted the whole day.    Wt Readings from Last 3 Encounters:  02/04/15 195 lb (88.451 kg)  03/28/14 189 lb (85.73 kg)  03/18/14 180 lb (81.647 kg)     Past Medical History  Diagnosis Date  . Osteoarthritis     L hip  . HTN (hypertension)   . Palpitations     h/o  . Hypokalemia     Past Surgical History  Procedure Laterality Date  . Vesicovaginal fistula closure w/ tah    . D&c and removal of endonetrial polyp    . Laparoscopy      with lysis of pelvic adhesions   . Drainage of cyst      follicular  functional-type of cysyt- L ovary   . Uterosacral nerve ablation      with bipolar coagulation. Surgeon Dr. Jennette Escobar  . Nasal endoscopy      upper    Current Outpatient Prescriptions  Medication Sig Dispense Refill  . clotrimazole-betamethasone (LOTRISONE) cream     . diltiazem (TAZTIA XT) 360 MG 24 hr capsule TAKE ONE CAPSULE BY MOUTH EVERY DAY 90 capsule 3  . flecainide (TAMBOCOR) 50 MG tablet TAKE ONE TABLET BY MOUTH TWICE DAILY 60 tablet 1  . guanFACINE (TENEX) 2 MG tablet Take 1 tablet (2 mg total) by mouth at bedtime. 30 tablet 6  . KLOR-CON M10 10 MEQ tablet     . Multiple Vitamin (MULTIVITAMIN) tablet Take 1 tablet by mouth daily.      . potassium chloride (K-DUR) 10 MEQ tablet Take 1 tablet (10 mEq total) by mouth daily. 30 tablet 9  . rivaroxaban (XARELTO) 20 MG TABS tablet Take 1 tablet (20 mg total) by mouth daily with supper. 30 tablet 11  . telmisartan-hydrochlorothiazide (MICARDIS HCT) 80-25 MG per tablet Take 1 tablet by mouth daily. 90 tablet 3   Current Facility-Administered Medications  Medication Dose Route Frequency Provider Last Rate Last Dose  . cloNIDine (CATAPRES) tablet 0.1 mg  0.1 mg Oral Once Jill Macadamia, NP  Allergies:   No Known Allergies  Social History:  The patient  reports that she has quit smoking. She does not have any smokeless tobacco history on file. She reports that she drinks alcohol. She reports that she does not use illicit drugs.   No family history on file.  ROS:  Please see the history of present illness.   Positive for fatigue. Denies any fevers, chills, strokelike symptoms, orthopnea, PND, bleeding   All other systems reviewed and negative.   PHYSICAL EXAM: VS:  BP 150/98 mmHg  Pulse 67  Ht 5\' 8"  (1.727 m)  Wt 195 lb (88.451 kg)  BMI 29.66 kg/m2 Well nourished, well developed, in no acute distress HEENT: normal, Jill Escobar/AT, EOMI Neck: no JVD, normal carotid upstroke, no bruit Cardiac:  normal S1, S2; RRR; no murmur Lungs:   clear to auscultation bilaterally, no wheezing, rhonchi or rales Abd: soft, nontender, no hepatomegaly, no bruits Ext: no edema, 2+ distal pulses Skin: warm and dry GU: deferred Neuro: no focal abnormalities noted, AAO x 3  EKG:  02/04/15-sinus rhythm, QRS duration 84 ms.  ECHO: 02/18/14: - Left ventricle: The cavity size was normal. There was mild concentric hypertrophy. Systolic function was vigorous. The estimated ejection fraction was in the range of 65% to 70%. Wall motion was normal; there were no regional wall motion abnormalities. The study was not technically sufficient to allow evaluation of LV diastolic dysfunction due to atrial fibrillation. - Aortic valve: Trileaflet; normal thickness leaflets. No regurgitation. - Mitral valve: Mild regurgitation. - Left atrium: There is highly echogenic structure attached to the inferior left atrial wall measuring 20 x 9 mm. It is unchanged from the prior echocardiogram in 2011. The atrium was mildly dilated. - Right ventricle: Systolic function was normal. - Right atrium: The atrium was normal in size. - Tricuspid valve: Mild regurgitation. - Pulmonary arteries: Systolic pressure was within the normal range. - Inferior vena cava: The vessel was normal in size.      ASSESSMENT AND PLAN:  1. Paroxysmal atrial fibrillation with occasional pause-I agree with Jill Escobar that we should trial antiarrhythmic therapy. We tried 50 mg of flecainide twice a day however she continued to have episodes. I will increase this to 100 mg twice a day. She seems very symptomatic with this. She becomes nervous and sometimes dizzy when she has these episodes. If she continues to have these episodes, we will have her see Jill Escobar for further evaluation for possible ablative therapy.  We had lengthy discussion about atrial fibrillation, physiology, success rates with medications/ablations, lifestyle modification. I encouraged her to continue working. If  necessary in the future, we can have formal consultation with Jill Escobar. Continuing encouragement. 2. Blood pressure-excellent control currently. 3. 4 second pause-Toprol had been stopped. Sleep study had been performed. Awaiting results. Possible vagal phenomenon. No syncope. 4. Chronic anticoagulation-continue. Expressed the importance in stroke prevention.  Signed, Donato SchultzMark Harbor Vanover, MD [facility]  02/04/2015 4:09 PM

## 2015-03-03 ENCOUNTER — Ambulatory Visit: Payer: Federal, State, Local not specified - PPO | Admitting: Cardiology

## 2015-03-03 ENCOUNTER — Other Ambulatory Visit: Payer: Self-pay | Admitting: Nurse Practitioner

## 2015-03-18 ENCOUNTER — Other Ambulatory Visit: Payer: Self-pay | Admitting: *Deleted

## 2015-03-18 DIAGNOSIS — I10 Essential (primary) hypertension: Secondary | ICD-10-CM

## 2015-03-18 DIAGNOSIS — I480 Paroxysmal atrial fibrillation: Secondary | ICD-10-CM

## 2015-03-18 MED ORDER — DILTIAZEM HCL ER BEADS 360 MG PO CP24: ORAL_CAPSULE | ORAL | Status: DC

## 2015-03-19 ENCOUNTER — Other Ambulatory Visit: Payer: Self-pay | Admitting: *Deleted

## 2015-03-19 DIAGNOSIS — I10 Essential (primary) hypertension: Secondary | ICD-10-CM

## 2015-03-19 DIAGNOSIS — I480 Paroxysmal atrial fibrillation: Secondary | ICD-10-CM

## 2015-03-19 MED ORDER — DILTIAZEM HCL ER BEADS 360 MG PO CP24: ORAL_CAPSULE | ORAL | Status: DC

## 2015-04-08 ENCOUNTER — Encounter: Payer: Self-pay | Admitting: *Deleted

## 2015-04-09 ENCOUNTER — Ambulatory Visit (INDEPENDENT_AMBULATORY_CARE_PROVIDER_SITE_OTHER): Payer: Federal, State, Local not specified - PPO | Admitting: Cardiology

## 2015-04-09 ENCOUNTER — Encounter: Payer: Self-pay | Admitting: Cardiology

## 2015-04-09 VITALS — BP 122/66 | HR 62 | Ht 68.0 in | Wt 196.0 lb

## 2015-04-09 DIAGNOSIS — I480 Paroxysmal atrial fibrillation: Secondary | ICD-10-CM | POA: Diagnosis not present

## 2015-04-09 DIAGNOSIS — R931 Abnormal findings on diagnostic imaging of heart and coronary circulation: Secondary | ICD-10-CM | POA: Diagnosis not present

## 2015-04-09 DIAGNOSIS — Z7901 Long term (current) use of anticoagulants: Secondary | ICD-10-CM

## 2015-04-09 DIAGNOSIS — I10 Essential (primary) hypertension: Secondary | ICD-10-CM | POA: Diagnosis not present

## 2015-04-09 NOTE — Patient Instructions (Addendum)
Medication Instructions:  Your physician recommends that you continue on your current medications as directed. Please refer to the Current Medication list given to you today.  Labwork: NONE  Testing/Procedures: Your physician has requested that you have a TEE. During a TEE, sound waves are used to create images of your heart. It provides your doctor with information about the size and shape of your heart and how well your heart's chambers and valves are working. In this test, a transducer is attached to the end of a flexible tube that's guided down your throat and into your esophagus (the tube leading from you mouth to your stomach) to get a more detailed image of your heart. You are not awake for the procedure. Please see the instruction sheet given to you today. For further information please visit [email or web address]  Follow-Up: Your physician recommends that you schedule a follow-up appointment with Dr. Johney FrameAllred for possible ablation    Any Other Special Instructions Will Be Listed Below (If Applicable).

## 2015-04-09 NOTE — Progress Notes (Signed)
    [address] [place], [place]  [address] Phone: [phone] Fax:  [phone]  Date:  04/09/2015   ID:  Jill Escobar, DOB 07/31/1956, MRN [id number]  PCP:  RAMACHANDRAN,AJITH, MD   History of Present Illness: Jill Escobar is a 58 y.o. female previously seen by Lori Gerhardt here for the evaluation of 4 second pause, paroxysmal atrial fibrillation seen on event monitor. She also has hypertension.  She is a former patient of Dr. Wall's. She was seen back in April of 2015 to get her medications refilled and her blood pressures were highly elevated at that time. She had an event monitor which showed a pause of 3.9 seconds occurred at 2 AM while sleeping, snoring, advised sleep study. Atrial fibrillation, 3 second pause noted at 4 PM while awake. Asymptomatic. There was a possible mass in the left atrium which is unchanged from 2011. Anticoagulation advised.  Lori discussed with Dr. Allred and advise cutting the Toprol back and felt like she was going to need some type of antiarrhythmic therapy to hold sinus rhythm.  Renal duplex was normal. Working long hours. Main complaint is fatigue which she says is associated with atrial fibrillation. There were periods on the monitor where she may have felt fatigued however she was in normal rhythm.  02/03/14 - had another bad episode after Easter. This was on Flecainide 50 BID. Thinks long day triggered it. Episode lasted the whole day.   04/09/15 - she is still having breakthrough atrial fibrillation about twice a month that is quite symptomatic. She expresses concern over staying on medication all of her life. See below for details. No chest pain.. No SOB, no strokelike symptoms.    Wt Readings from Last 3 Encounters:  04/09/15 196 lb (88.905 kg)  02/04/15 195 lb (88.451 kg)  03/28/14 189 lb (85.73 kg)     Past Medical History  Diagnosis Date  . Osteoarthritis     L hip  . HTN (hypertension)   . Palpitations     h/o    . Hypokalemia     Past Surgical History  Procedure Laterality Date  . Vesicovaginal fistula closure w/ tah    . D&c and removal of endonetrial polyp    . Laparoscopy      with lysis of pelvic adhesions   . Drainage of cyst      follicular functional-type of cysyt- L ovary   . Uterosacral nerve ablation      with bipolar coagulation. Surgeon Dr. Neal  . Nasal endoscopy      upper    Current Outpatient Prescriptions  Medication Sig Dispense Refill  . diltiazem (TAZTIA XT) 360 MG 24 hr capsule TAKE ONE CAPSULE BY MOUTH EVERY DAY 90 capsule 3  . flecainide (TAMBOCOR) 100 MG tablet Take 1 tablet (100 mg total) by mouth 2 (two) times daily. 60 tablet 11  . KLOR-CON M10 10 MEQ tablet Take 10 mEq by mouth daily.     . Multiple Vitamin (MULTIVITAMIN) tablet Take 1 tablet by mouth daily.      . rivaroxaban (XARELTO) 20 MG TABS tablet Take 1 tablet (20 mg total) by mouth daily with supper. 30 tablet 11  . telmisartan-hydrochlorothiazide (MICARDIS HCT) 80-25 MG per tablet TAKE ONE TABLET BY MOUTH ONCE DAILY 90 tablet 0   Current Facility-Administered Medications  Medication Dose Route Frequency Provider Last Rate Last Dose  . cloNIDine (CATAPRES) tablet 0.1 mg  0.1 mg Oral Once Lori   C Gerhardt, NP        Allergies:   No Known Allergies  Social History:  The patient  reports that she has quit smoking. She does not have any smokeless tobacco history on file. She reports that she drinks alcohol. She reports that she does not use illicit drugs.   Family History  Problem Relation Age of Onset  . Cancer Father   . Other Mother     MVA    ROS:  Please see the history of present illness.   Positive for fatigue. Denies any fevers, chills, strokelike symptoms, orthopnea, PND, bleeding   All other systems reviewed and negative.   PHYSICAL EXAM: VS:  BP 122/66 mmHg  Pulse 62  Ht 5' 8" (1.727 m)  Wt 196 lb (88.905 kg)  BMI 29.81 kg/m2 Well nourished, well developed, in no acute  distress HEENT: normal, [place]/AT, EOMI Neck: no JVD, normal carotid upstroke, no bruit Cardiac:  normal S1, S2; RRR; 2/6 systolic murmur Lungs:  clear to auscultation bilaterally, no wheezing, rhonchi or rales Abd: soft, nontender, no hepatomegaly, no bruits Ext: no edema, 2+ distal pulses Skin: warm and dry GU: deferred Neuro: no focal abnormalities noted, AAO x 3  EKG:  Today 04/09/15-sinus rhythm, 62, old septal infarct pattern-prior 02/03/14-sinus rhythm, QRS duration 84 ms.  ECHO: 02/18/14: - Left ventricle: The cavity size was normal. There was mild concentric hypertrophy. Systolic function was vigorous. The estimated ejection fraction was in the range of 65% to 70%. Wall motion was normal; there were no regional wall motion abnormalities. The study was not technically sufficient to allow evaluation of LV diastolic dysfunction due to atrial fibrillation. - Aortic valve: Trileaflet; normal thickness leaflets. No regurgitation. - Mitral valve: Mild regurgitation. - Left atrium: There is highly echogenic structure attached to the inferior left atrial wall measuring 20 x 9 mm. It is unchanged from the prior echocardiogram in 2011. The atrium was mildly dilated. - Right ventricle: Systolic function was normal. - Right atrium: The atrium was normal in size. - Tricuspid valve: Mild regurgitation. - Pulmonary arteries: Systolic pressure was within the normal range. - Inferior vena cava: The vessel was normal in size.      ASSESSMENT AND PLAN:  1. Paroxysmal atrial fibrillation with occasional pause-flecainide 100 mg twice a day. She seems very symptomatic with this. She still having some breakthrough atrial fibrillation about twice a month that really bothers her, symptomatic. She becomes nervous and sometimes dizzy when she has these episodes. Dr. Allred for further evaluation for possible ablative therapy.  We had lengthy discussion about atrial fibrillation, physiology, success rates  with medications/ablations, lifestyle modification. She would like to eventually try to come off of as much medication as possible. 2. Left atrial structure/abnormal echocardiogram-on to prior echocardiograms there was a highly echogenic structure attached to the inferior left atrial free wall measuring 20 x 9 mm unchanged from prior echocardiogram in 2011. I will be checking a transesophageal echocardiogram to further clarify this. After the TEE if necessary, I will proceed with MRI. 3. Blood pressure-excellent control currently. 4. 4 second pause previously-Toprol had been stopped. Sleep study had been performed.  Possible vagal phenomenon. No syncope. 5. Chronic anticoagulation-continue. Expressed the importance in stroke prevention. Xarelto. CHADS-VASc 2  Signed, Wing Gfeller, MD FACC  04/09/2015 4:51 PM     

## 2015-04-10 ENCOUNTER — Other Ambulatory Visit (INDEPENDENT_AMBULATORY_CARE_PROVIDER_SITE_OTHER): Payer: Federal, State, Local not specified - PPO | Admitting: *Deleted

## 2015-04-10 ENCOUNTER — Telehealth: Payer: Self-pay

## 2015-04-10 DIAGNOSIS — R931 Abnormal findings on diagnostic imaging of heart and coronary circulation: Secondary | ICD-10-CM

## 2015-04-10 DIAGNOSIS — I10 Essential (primary) hypertension: Secondary | ICD-10-CM

## 2015-04-10 DIAGNOSIS — Z01812 Encounter for preprocedural laboratory examination: Secondary | ICD-10-CM | POA: Diagnosis not present

## 2015-04-10 DIAGNOSIS — I4891 Unspecified atrial fibrillation: Secondary | ICD-10-CM

## 2015-04-10 LAB — BASIC METABOLIC PANEL
BUN: 21 mg/dL (ref 6–23)
CHLORIDE: 99 meq/L (ref 96–112)
CO2: 30 mEq/L (ref 19–32)
CREATININE: 1.07 mg/dL (ref 0.40–1.20)
Calcium: 9.6 mg/dL (ref 8.4–10.5)
GFR: 67.87 mL/min (ref >60.00)
GLUCOSE: 93 mg/dL (ref 70–99)
POTASSIUM: 3.7 meq/L (ref 3.5–5.1)
Sodium: 134 mEq/L — ABNORMAL LOW (ref 135–145)

## 2015-04-10 LAB — CBC WITH DIFFERENTIAL/PLATELET
BASOS ABS: 0 10*3/uL (ref 0.0–0.1)
BASOS PCT: 0.4 % (ref 0.0–3.0)
EOS PCT: 1.3 % (ref 0.0–5.0)
Eosinophils Absolute: 0.1 10*3/uL (ref 0.0–0.7)
HCT: 35 % — ABNORMAL LOW (ref 36.0–46.0)
Hemoglobin: 11.9 g/dL — ABNORMAL LOW (ref 12.0–15.0)
LYMPHS ABS: 2.1 10*3/uL (ref 0.7–4.0)
Lymphocytes Relative: 44.6 % (ref 12.0–46.0)
MCHC: 34.1 g/dL (ref 30.0–36.0)
MCV: 98.6 fl (ref 78.0–100.0)
MONO ABS: 0.4 10*3/uL (ref 0.1–1.0)
Monocytes Relative: 8.3 % (ref 3.0–12.0)
NEUTROS ABS: 2.1 10*3/uL (ref 1.4–7.7)
Neutrophils Relative %: 45.4 % (ref 43.0–77.0)
PLATELETS: 216 10*3/uL (ref 150.0–400.0)
RBC: 3.55 Mil/uL — ABNORMAL LOW (ref 3.87–5.11)
RDW: 13.3 % (ref 11.5–15.5)
WBC: 4.7 10*3/uL (ref 4.0–10.5)

## 2015-04-10 LAB — PROTIME-INR
INR: 1.4 ratio — ABNORMAL HIGH (ref 0.8–1.0)
Prothrombin Time: 15.8 s — ABNORMAL HIGH (ref 9.6–13.1)

## 2015-04-10 LAB — APTT: APTT: 43.7 s — AB (ref 23.4–32.7)

## 2015-04-10 NOTE — Telephone Encounter (Signed)
TEE scheduled for Monday, 04/14/15 at 1000. Instructed patient to arrive at the hospital at 0830. Reviewed procedure instructions with patient. Patient to come to the office today for pre-procedure lab work. Patient agrees with treatment plan.  TEE confirmation # [id number]

## 2015-04-14 ENCOUNTER — Encounter (HOSPITAL_COMMUNITY): Payer: Self-pay | Admitting: *Deleted

## 2015-04-14 ENCOUNTER — Encounter (HOSPITAL_COMMUNITY)
Admission: RE | Disposition: A | Discharge status: Home / Self Care | Payer: Self-pay | Source: Ambulatory Visit | Attending: Cardiology

## 2015-04-14 ENCOUNTER — Ambulatory Visit (HOSPITAL_COMMUNITY): Payer: Federal, State, Local not specified - PPO

## 2015-04-14 ENCOUNTER — Ambulatory Visit (HOSPITAL_COMMUNITY)
Admission: RE | Admit: 2015-04-14 | Discharge: 2015-04-14 | Disposition: A | Discharge status: Home / Self Care | Payer: Federal, State, Local not specified - PPO | Source: Ambulatory Visit | Attending: Cardiology | Admitting: Cardiology

## 2015-04-14 DIAGNOSIS — Z7901 Long term (current) use of anticoagulants: Secondary | ICD-10-CM | POA: Diagnosis not present

## 2015-04-14 DIAGNOSIS — I480 Paroxysmal atrial fibrillation: Secondary | ICD-10-CM | POA: Diagnosis not present

## 2015-04-14 DIAGNOSIS — I340 Nonrheumatic mitral (valve) insufficiency: Secondary | ICD-10-CM | POA: Diagnosis not present

## 2015-04-14 DIAGNOSIS — I10 Essential (primary) hypertension: Secondary | ICD-10-CM | POA: Insufficient documentation

## 2015-04-14 DIAGNOSIS — Z79899 Other long term (current) drug therapy: Secondary | ICD-10-CM | POA: Insufficient documentation

## 2015-04-14 DIAGNOSIS — I4891 Unspecified atrial fibrillation: Secondary | ICD-10-CM | POA: Diagnosis present

## 2015-04-14 DIAGNOSIS — R931 Abnormal findings on diagnostic imaging of heart and coronary circulation: Secondary | ICD-10-CM

## 2015-04-14 DIAGNOSIS — E876 Hypokalemia: Secondary | ICD-10-CM | POA: Diagnosis not present

## 2015-04-14 DIAGNOSIS — M1612 Unilateral primary osteoarthritis, left hip: Secondary | ICD-10-CM | POA: Insufficient documentation

## 2015-04-14 HISTORY — PX: TEE WITHOUT CARDIOVERSION: SHX5443

## 2015-04-14 SURGERY — ECHOCARDIOGRAM, TRANSESOPHAGEAL
Anesthesia: Moderate Sedation

## 2015-04-14 MED ORDER — MIDAZOLAM HCL 10 MG/2ML IJ SOLN
INTRAMUSCULAR | Status: DC | PRN
  Administered 2015-04-14: 1 mg via INTRAVENOUS
  Administered 2015-04-14: 2 mg via INTRAVENOUS

## 2015-04-14 MED ORDER — BUTAMBEN-TETRACAINE-BENZOCAINE 2-2-14 % EX AERO
INHALATION_SPRAY | CUTANEOUS | Status: DC | PRN
  Administered 2015-04-14: 2 via TOPICAL

## 2015-04-14 MED ORDER — FENTANYL CITRATE (PF) 100 MCG/2ML IJ SOLN
INTRAMUSCULAR | Status: AC
Start: 2015-04-14 — End: 2015-04-14
  Filled 2015-04-14: qty 2

## 2015-04-14 MED ORDER — SODIUM CHLORIDE 0.9 % IV SOLN
INTRAVENOUS | Status: DC
  Administered 2015-04-14: 500 mL via INTRAVENOUS

## 2015-04-14 MED ORDER — FENTANYL CITRATE (PF) 100 MCG/2ML IJ SOLN
INTRAMUSCULAR | Status: DC | PRN
  Administered 2015-04-14 (×2): 25 ug via INTRAVENOUS

## 2015-04-14 MED ORDER — MIDAZOLAM HCL 5 MG/ML IJ SOLN
INTRAMUSCULAR | Status: AC
  Filled 2015-04-14: qty 2

## 2015-04-14 NOTE — H&P (View-Only) (Signed)
[address], [address] [place], [place]  [address] Phone: [phone] Fax:  [phone]  Date:  04/09/2015   ID:  Jill Escobar, DOB [birth year], MRN [id number]  PCP:  Georgianne Fick, MD   History of Present Illness: Jill Escobar is a 59 y.o. female previously seen by Norma Fredrickson here for the evaluation of 4 second pause, paroxysmal atrial fibrillation seen on event monitor. She also has hypertension.  She is a former patient of Dr. Vern Claude. She was seen back in April of 2015 to get her medications refilled and her blood pressures were highly elevated at that time. She had an event monitor which showed a pause of 3.9 seconds occurred at 2 AM while sleeping, snoring, advised sleep study. Atrial fibrillation, 3 second pause noted at 4 PM while awake. Asymptomatic. There was a possible mass in the left atrium which is unchanged from 2011. Anticoagulation advised.  [name] discussed with Dr. Johney Frame and advise cutting the Toprol back and felt like she was going to need some type of antiarrhythmic therapy to hold sinus rhythm.  Renal duplex was normal. Working long hours. Main complaint is fatigue which she says is associated with atrial fibrillation. There were periods on the monitor where she may have felt fatigued however she was in normal rhythm.  02/03/14 - had another bad episode after Easter. This was on Flecainide 50 BID. Thinks long day triggered it. Episode lasted the whole day.   04/09/15 - she is still having breakthrough atrial fibrillation about twice a month that is quite symptomatic. She expresses concern over staying on medication all of her life. See below for details. No chest pain.. No SOB, no strokelike symptoms.    Wt Readings from Last 3 Encounters:  04/09/15 196 lb (88.905 kg)  02/04/15 195 lb (88.451 kg)  03/28/14 189 lb (85.73 kg)     Past Medical History  Diagnosis Date  . Osteoarthritis     L hip  . HTN (hypertension)   . Palpitations     h/o    . Hypokalemia     Past Surgical History  Procedure Laterality Date  . Vesicovaginal fistula closure w/ tah    . D&c and removal of endonetrial polyp    . Laparoscopy      with lysis of pelvic adhesions   . Drainage of cyst      follicular functional-type of cysyt- L ovary   . Uterosacral nerve ablation      with bipolar coagulation. Surgeon Dr. Jennette Kettle  . Nasal endoscopy      upper    Current Outpatient Prescriptions  Medication Sig Dispense Refill  . diltiazem (TAZTIA XT) 360 MG 24 hr capsule TAKE ONE CAPSULE BY MOUTH EVERY DAY 90 capsule 3  . flecainide (TAMBOCOR) 100 MG tablet Take 1 tablet (100 mg total) by mouth 2 (two) times daily. 60 tablet 11  . KLOR-CON M10 10 MEQ tablet Take 10 mEq by mouth daily.     . Multiple Vitamin (MULTIVITAMIN) tablet Take 1 tablet by mouth daily.      . rivaroxaban (XARELTO) 20 MG TABS tablet Take 1 tablet (20 mg total) by mouth daily with supper. 30 tablet 11  . telmisartan-hydrochlorothiazide (MICARDIS HCT) 80-25 MG per tablet TAKE ONE TABLET BY MOUTH ONCE DAILY 90 tablet 0   Current Facility-Administered Medications  Medication Dose Route Frequency Provider Last Rate Last Dose  . cloNIDine (CATAPRES) tablet 0.1 mg  0.1 mg Oral Once [name]  Temple Pacini, NP        Allergies:   No Known Allergies  Social History:  The patient  reports that she has quit smoking. She does not have any smokeless tobacco history on file. She reports that she drinks alcohol. She reports that she does not use illicit drugs.   Family History  Problem Relation Age of Onset  . Cancer Father   . Other Mother     MVA    ROS:  Please see the history of present illness.   Positive for fatigue. Denies any fevers, chills, strokelike symptoms, orthopnea, PND, bleeding   All other systems reviewed and negative.   PHYSICAL EXAM: VS:  BP 122/66 mmHg  Pulse 62  Ht  (1.727 m)  Wt 196 lb (88.905 kg)  BMI 29.81 kg/m2 Well nourished, well developed, in no acute  distress HEENT: normal, [place]/AT, EOMI Neck: no JVD, normal carotid upstroke, no bruit Cardiac:  normal S1, S2; RRR; 2/6 systolic murmur Lungs:  clear to auscultation bilaterally, no wheezing, rhonchi or rales Abd: soft, nontender, no hepatomegaly, no bruits Ext: no edema, 2+ distal pulses Skin: warm and dry GU: deferred Neuro: no focal abnormalities noted, AAO x 3  EKG:  Today 04/09/15-sinus rhythm, 62, old septal infarct pattern-prior 02/03/14-sinus rhythm, QRS duration 84 ms.  ECHO: 02/18/14: - Left ventricle: The cavity size was normal. There was mild concentric hypertrophy. Systolic function was vigorous. The estimated ejection fraction was in the range of 65% to 70%. Wall motion was normal; there were no regional wall motion abnormalities. The study was not technically sufficient to allow evaluation of LV diastolic dysfunction due to atrial fibrillation. - Aortic valve: Trileaflet; normal thickness leaflets. No regurgitation. - Mitral valve: Mild regurgitation. - Left atrium: There is highly echogenic structure attached to the inferior left atrial wall measuring 20 x 9 mm. It is unchanged from the prior echocardiogram in 2011. The atrium was mildly dilated. - Right ventricle: Systolic function was normal. - Right atrium: The atrium was normal in size. - Tricuspid valve: Mild regurgitation. - Pulmonary arteries: Systolic pressure was within the normal range. - Inferior vena cava: The vessel was normal in size.      ASSESSMENT AND PLAN:  1. Paroxysmal atrial fibrillation with occasional pause-flecainide 100 mg twice a day. She seems very symptomatic with this. She still having some breakthrough atrial fibrillation about twice a month that really bothers her, symptomatic. She becomes nervous and sometimes dizzy when she has these episodes. Dr. Johney Frame for further evaluation for possible ablative therapy.  We had lengthy discussion about atrial fibrillation, physiology, success rates  with medications/ablations, lifestyle modification. She would like to eventually try to come off of as much medication as possible. 2. Left atrial structure/abnormal echocardiogram-on to prior echocardiograms there was a highly echogenic structure attached to the inferior left atrial free wall measuring 20 x 9 mm unchanged from prior echocardiogram in 2011. I will be checking a transesophageal echocardiogram to further clarify this. After the TEE if necessary, I will proceed with MRI. 3. Blood pressure-excellent control currently. 4. 4 second pause previously-Toprol had been stopped. Sleep study had been performed.  Possible vagal phenomenon. No syncope. 5. Chronic anticoagulation-continue. Expressed the importance in stroke prevention. Xarelto. CHADS-VASc 2  Signed, Donato Schultz, MD [facility]  04/09/2015 4:51 PM

## 2015-04-14 NOTE — Discharge Instructions (Signed)
Transesophageal Echocardiogram °Transesophageal echocardiography (TEE) is a picture test of your heart using sound waves. The pictures taken can give very detailed pictures of your heart. This can help your doctor see if there are problems with your heart. TEE can check: °· If your heart has blood clots in it. °· How well your heart valves are working. °· If you have an infection on the inside of your heart. °· Some of the major arteries of your heart. °· If your heart valve is working after a repair. °· Your heart before a procedure that uses a shock to your heart to get the rhythm back to normal. °BEFORE THE PROCEDURE °· Do not eat or drink for 6 hours before the procedure or as told by your doctor. °· Make plans to have someone drive you home after the procedure. Do not drive yourself home. °· An IV tube will be put in your arm. °PROCEDURE °· You will be given a medicine to help you relax (sedative). It will be given through the IV tube. °· A numbing medicine will be sprayed or gargled in the back of your throat to help numb it. °· The tip of the probe is placed into the back of your mouth. You will be asked to swallow. This helps to pass the probe into your esophagus. °· Once the tip of the probe is in the right place, your doctor can take pictures of your heart. °· You may feel pressure at the back of your throat. °AFTER THE PROCEDURE °· You will be taken to a recovery area so the sedative can wear off. °· Your throat may be sore and scratchy. This will go away slowly over time. °· You will go home when you are fully awake and able to swallow liquids. °· You should have someone stay with you for the next 24 hours. °· Do not drive or operate machinery for the next 24 hours. °Document Released: 08/15/2009 Document Revised: 10/23/2013 Document Reviewed: 04/19/2013 °[employer]® Patient Information ©2015 [employer]. This information is not intended to replace advice given to you by your health care provider. Make  sure you discuss any questions you have with your health care provider. ° °

## 2015-04-14 NOTE — Interval H&P Note (Signed)
History and Physical Interval Note:  04/14/2015 10:14 AM  Jill Escobar  has presented today for surgery, with the diagnosis of EVAUATE LEFT ATRIM  The various methods of treatment have been discussed with the patient and family. After consideration of risks, benefits and other options for treatment, the patient has consented to  Procedure(s): TRANSESOPHAGEAL ECHOCARDIOGRAM (TEE) (N/A) as a surgical intervention .  The patient's history has been reviewed, patient examined, no change in status, stable for surgery.  I have reviewed the patient's chart and labs.  Questions were answered to the patient's satisfaction.     SKAINS, MARK

## 2015-04-14 NOTE — CV Procedure (Signed)
    TEE  Indication: abnormal ECHO (?LA abnormality), AFIB  Findings:   - Prominent "coumadin ridge" demarcating the left atrial appendage.   - No LA mass identified or extra cardiac mass  - Normal EF  - Mild MR  Reassuring  She will have consultation with Dr. Johney Frame to discuss ablation.   Donato Schultz, MD

## 2015-04-14 NOTE — Progress Notes (Signed)
  Echocardiogram 2D Echocardiogram has been performed.  Jill Escobar 04/14/2015, 10:44 AM

## 2015-04-14 NOTE — Interval H&P Note (Signed)
History and Physical Interval Note:  04/14/2015 9:10 AM  Jill Escobar  has presented today for surgery, with the diagnosis of EVAUATE LEFT ATRIM  The various methods of treatment have been discussed with the patient and family. After consideration of risks, benefits and other options for treatment, the patient has consented to  Procedure(s): TRANSESOPHAGEAL ECHOCARDIOGRAM (TEE) (N/A) as a surgical intervention .  The patient's history has been reviewed, patient examined, no change in status, stable for surgery.  I have reviewed the patient's chart and labs.  Questions were answered to the patient's satisfaction.     Andy Moye

## 2015-04-15 ENCOUNTER — Encounter (HOSPITAL_COMMUNITY): Payer: Self-pay | Admitting: Cardiology

## 2015-04-30 ENCOUNTER — Encounter: Payer: Self-pay | Admitting: *Deleted

## 2015-04-30 ENCOUNTER — Encounter: Payer: Self-pay | Admitting: Internal Medicine

## 2015-04-30 ENCOUNTER — Ambulatory Visit (INDEPENDENT_AMBULATORY_CARE_PROVIDER_SITE_OTHER): Payer: Federal, State, Local not specified - PPO | Admitting: Internal Medicine

## 2015-04-30 VITALS — BP 128/86 | HR 63 | Ht 69.0 in | Wt 198.0 lb

## 2015-04-30 DIAGNOSIS — I480 Paroxysmal atrial fibrillation: Secondary | ICD-10-CM

## 2015-04-30 DIAGNOSIS — I10 Essential (primary) hypertension: Secondary | ICD-10-CM

## 2015-04-30 NOTE — Progress Notes (Signed)
Electrophysiology Office Note   Date:  04/30/2015   ID:  Jill Escobar, DOB [birth year], MRN [id number]  PCP:  Georgianne Fick, MD  Cardiologist:  Dr Anne Fu Primary Electrophysiologist: Hillis Range, MD    Chief Complaint  Patient presents with  . PAF     History of Present Illness: Jill Escobar is a 59 y.o. female who presents today for electrophysiology evaluation.   The patient has symptomatic paroxysmal atrial fibrillation.  She reports symptoms of palpitations and fatigue.  She feels "washed out" with afib.  She finds her afib to be quite disturbing.  She had had nocturnal pauses for which metoprolol has been previously discontinued.  She has tried flecainide without resolution.  Presently episodes occur several times per month and seem to cause significant anxiety.    Today, she denies symptoms of palpitations, chest pain, shortness of breath, orthopnea, PND, lower extremity edema, claudication, dizziness, presyncope, syncope, bleeding, or neurologic sequela. The patient is tolerating medications without difficulties and is otherwise without complaint today.    Past Medical History  Diagnosis Date  . Osteoarthritis     L hip  . HTN (hypertension)   . Atrial fibrillation   . Hypokalemia   . Overweight    Past Surgical History  Procedure Laterality Date  . Vesicovaginal fistula closure w/ tah    . D&c and removal of endonetrial polyp    . Laparoscopy      with lysis of pelvic adhesions   . Drainage of cyst      follicular functional-type of cysyt- L ovary   . Uterosacral nerve ablation      with bipolar coagulation. Surgeon Dr. Jennette Kettle  . Nasal endoscopy      upper  . Tee without cardioversion N/A 04/14/2015    Procedure: TRANSESOPHAGEAL ECHOCARDIOGRAM (TEE);  Surgeon: Jake Bathe, MD;  Location: [facility] ENDOSCOPY;  Service: Cardiovascular;  Laterality: N/A;     Current Outpatient Prescriptions  Medication Sig Dispense Refill  . diltiazem (TAZTIA XT) 360 MG 24  hr capsule TAKE ONE CAPSULE BY MOUTH EVERY DAY 90 capsule 3  . flecainide (TAMBOCOR) 100 MG tablet Take 1 tablet (100 mg total) by mouth 2 (two) times daily. 60 tablet 11  . Multiple Vitamin (MULTIVITAMIN) tablet Take 1 tablet by mouth daily.      . rivaroxaban (XARELTO) 20 MG TABS tablet Take 1 tablet (20 mg total) by mouth daily with supper. 30 tablet 11  . telmisartan-hydrochlorothiazide (MICARDIS HCT) 80-25 MG per tablet TAKE ONE TABLET BY MOUTH ONCE DAILY 90 tablet 0   Current Facility-Administered Medications  Medication Dose Route Frequency Provider Last Rate Last Dose  . cloNIDine (CATAPRES) tablet 0.1 mg  0.1 mg Oral Once Rosalio Macadamia, NP        Allergies:   Review of patient's allergies indicates no known allergies.   Social History:  The patient  reports that she has quit smoking. She does not have any smokeless tobacco history on file. She reports that she drinks alcohol. She reports that she does not use illicit drugs.   Family History:  The patient's  family history includes Cancer in her father; Other in her mother.    ROS:  Please see the history of present illness.   All other systems are reviewed and negative.    PHYSICAL EXAM: VS:  BP 128/86 mmHg  Pulse 63  Ht 5\' 9"  (1.753 m)  Wt 89.812 kg (198 lb)  BMI 29.23 kg/m2 , BMI Body mass  index is 29.23 kg/(m^2). GEN: Well nourished, well developed, in no acute distress HEENT: normal Neck: no JVD, carotid bruits, or masses Cardiac: RRR; no murmurs, rubs, or gallops,no edema  Respiratory:  clear to auscultation bilaterally, normal work of breathing GI: soft, nontender, nondistended, + BS MS: no deformity or atrophy Skin: warm and dry  Neuro:  Strength and sensation are intact Psych: euthymic mood, full affect  EKG:  EKG is ordered today. The ekg ordered today shows sinus rhythm    Recent Labs: 04/10/2015: BUN 21; Creatinine, Ser 1.07; Hemoglobin 11.9*; Platelets 216.0; Potassium 3.7; Sodium 134*    Lipid Panel      Component Value Date/Time   CHOL * 05/12/2010 0347    206        ATP III CLASSIFICATION:  <200     mg/dL   Desirable  [id number]  mg/dL   Borderline High  >=045>=240    mg/dL   High          TRIG 40 05/12/2010 0347   HDL 96 05/12/2010 0347   CHOLHDL 2.1 05/12/2010 0347   VLDL 8 05/12/2010 0347   LDLCALC * 05/12/2010 0347    102        Total Cholesterol/HDL:CHD Risk Coronary Heart Disease Risk Table                     Men   Women  1/2 Average Risk   3.4   3.3  Average Risk       5.0   4.4  2 X Average Risk   9.6   7.1  3 X Average Risk  23.4   11.0        Use the calculated Patient Ratio above and the CHD Risk Table to determine the patient's CHD Risk.        ATP III CLASSIFICATION (LDL):  <100     mg/dL   Optimal  [id number]  mg/dL   Near or Above                    Optimal  130-159  mg/dL   Borderline  [id number]  mg/dL   High  >956>190     mg/dL   Very High     Wt Readings from Last 3 Encounters:  04/30/15 89.812 kg (198 lb)  04/14/15 88.451 kg (195 lb)  04/09/15 88.905 kg (196 lb)      Other studies Reviewed: Additional studies/ records that were reviewed today include: Dr Anne FuSkains notes  Review of the above records today demonstrates: previously, echos have shown a left atrial lesion.  Recently a TEE was performed which documented this to actually be a prominent ridge of the appendage.   ASSESSMENT AND PLAN:  1.  Paroxysmal atrial fibrillation The patient has symptomatic paroxysmal atrial fibrillation. She has failed medical therapy with flecainide and diltiazem.  Therapeutic strategies for afib including medicine and ablation were discussed in detail with the patient today. Risk, benefits, and alternatives to EP study and radiofrequency ablation for afib were also discussed in detail today. These risks include but are not limited to stroke, bleeding, vascular damage, tamponade, perforation, damage to the esophagus, lungs, and other structures, pulmonary vein stenosis,  worsening renal function, and death. The patient understands these risk and wishes to proceed.  We will therefore proceed with catheter ablation at the next available time. chads2vasc score is at least 2.  Continue on anticoagulation  2. htn Stable No change required today  Current medicines are reviewed at length with the patient today.   The patient does not have concerns regarding her medicines.  The following changes were made today:  none  Labs/ tests ordered today include:  Orders Placed This Encounter  Procedures  . Basic metabolic panel  . CBC with Differential  . EKG 12-Lead     Signed, Hillis Range, MD  04/30/2015 10:09 PM     [facility] [address] [place] [address] [phone] (office) [phone] (fax)

## 2015-04-30 NOTE — Patient Instructions (Signed)
Medication Instructions:  Your physician recommends that you continue on your current medications as directed. Please refer to the Current Medication list given to you today.   Labwork: Your physician recommends that you return for lab work on 06/10/15 at 4pm   Testing/Procedures:   Your physician has recommended that you have an ablation. Catheter ablation is a medical procedure used to treat some cardiac arrhythmias (irregular heartbeats). During catheter ablation, a long, thin, flexible tube is put into a blood vessel in your groin (upper thigh), or neck. This tube is called an ablation catheter. It is then guided to your heart through the blood vessel. Radio frequency waves destroy small areas of heart tissue where abnormal heartbeats may cause an arrhythmia to start. Please see the instruction sheet given to you today.    Follow-Up: Your physician recommends that you schedule a follow-up appointment in: 4 weeks post ablation with Rudi Cocoonna Carroll, NP and 3 months post ablation with Dr Johney FrameAllred  Ablation is on 06/17/15   Any Other Special Instructions Will Be Listed Below (If Applicable).

## 2015-05-30 IMAGING — CT CT ABD-PELV W/ CM
2 of 5 series · 17 of 46 positions shown, 19 images · IV contrast (READICAT/WATER & [ID] OMNI 300)
Comparison: None.

CLINICAL DATA: 57-year-old female with low mid abdominal pain for
several weeks and constipation. No history of cancer. High blood
pressure. Initial encounter.

EXAM:
CT ABDOMEN AND PELVIS WITH CONTRAST
TECHNIQUE: Multidetector CT imaging of the abdomen and pelvis was performed
using the standard protocol following bolus administration of
intravenous contrast.
CONTRAST:  100mL OMNIPAQUE IOHEXOL 300 MG/ML  SOLN

[Series 2: abd/pelvis with · axial · 0.70mm/px · z∈[-392,-26]mm · 14 of 82 slices shown, 16 images]
[im 5/82  soft-tissue]
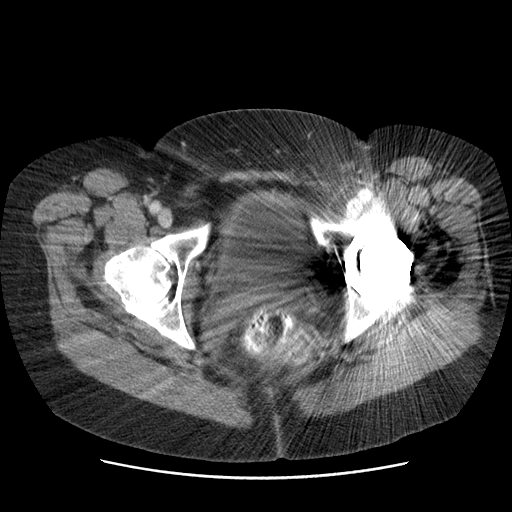
[im 5/82  bone]
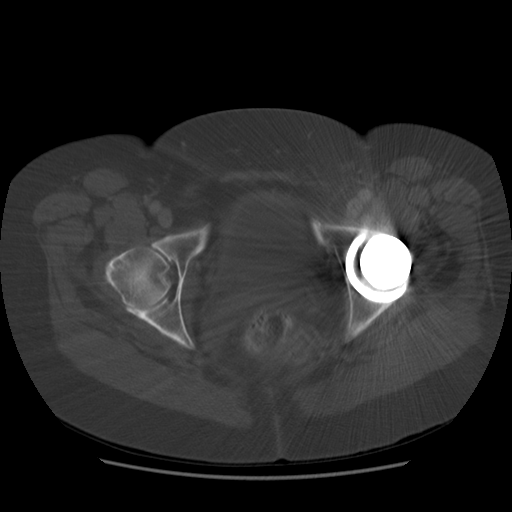
[im 10/82  soft-tissue]
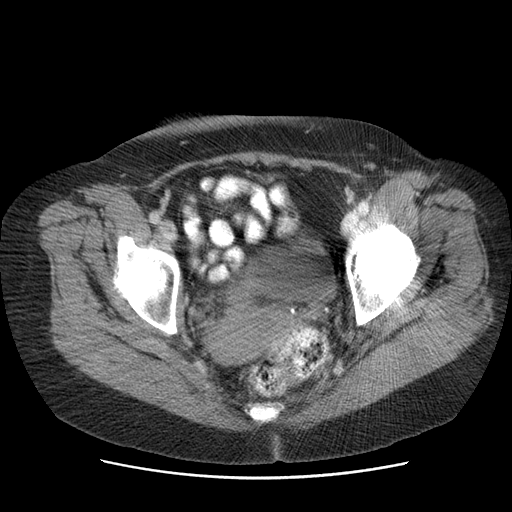
[im 15/82  soft-tissue]
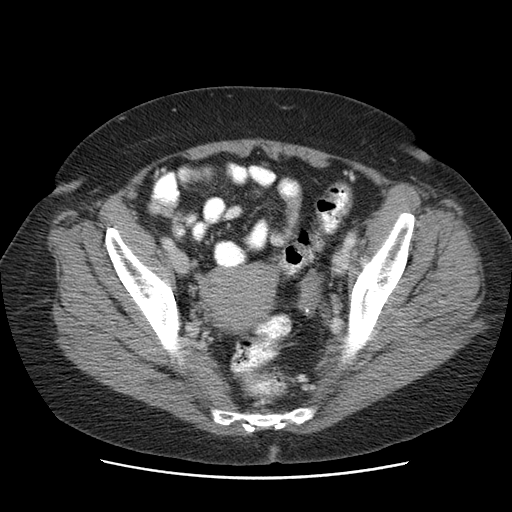
[im 24/82  soft-tissue]
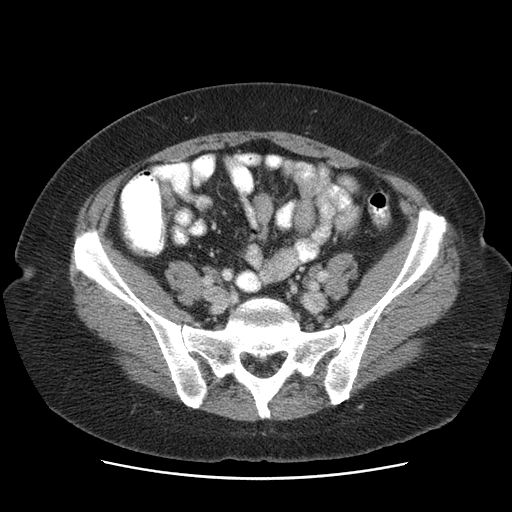
[im 29/82  soft-tissue]
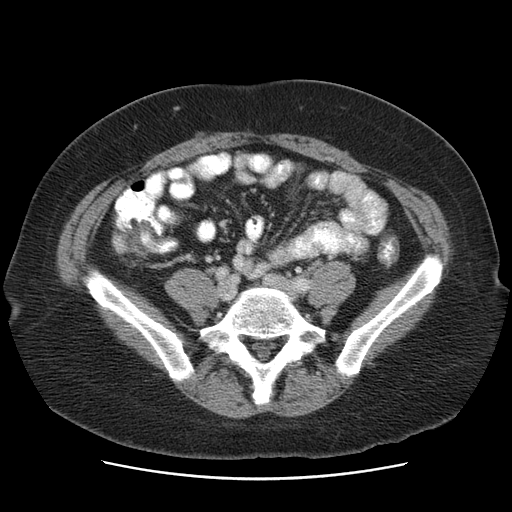
[im 34/82  soft-tissue]
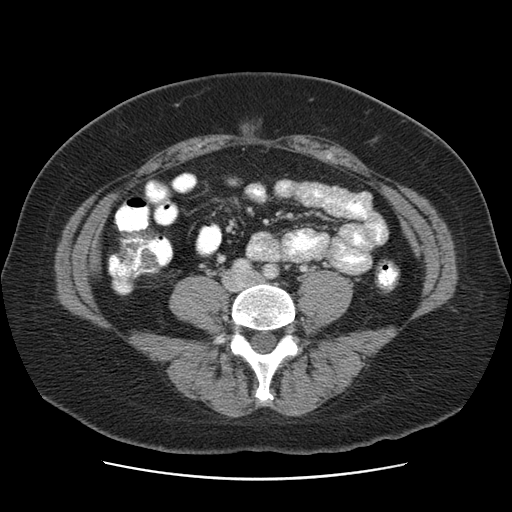
[im 39/82  soft-tissue]
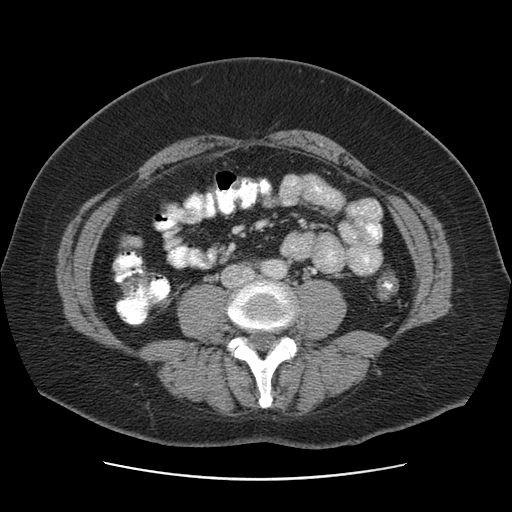
[im 43/82  soft-tissue]
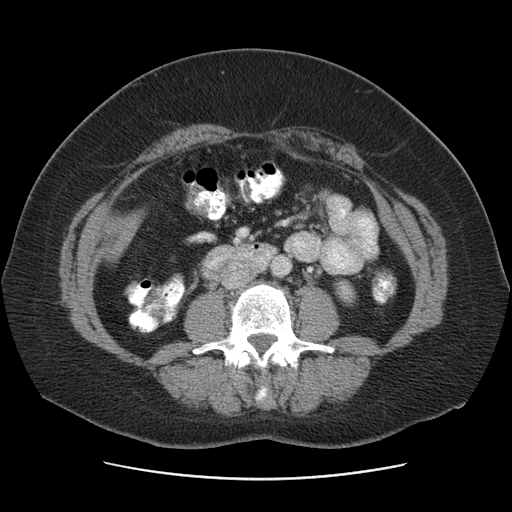
[im 48/82  soft-tissue]
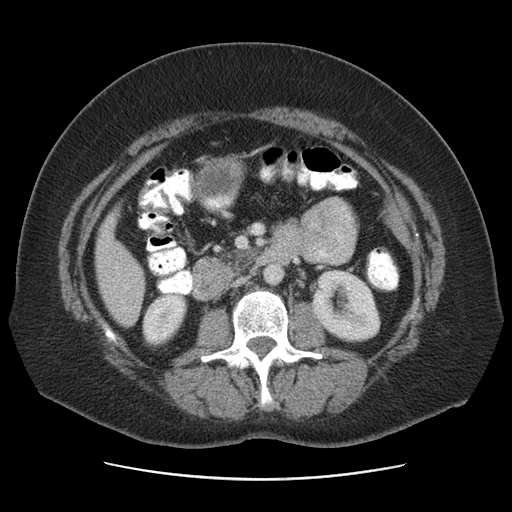
[im 48/82  bone]
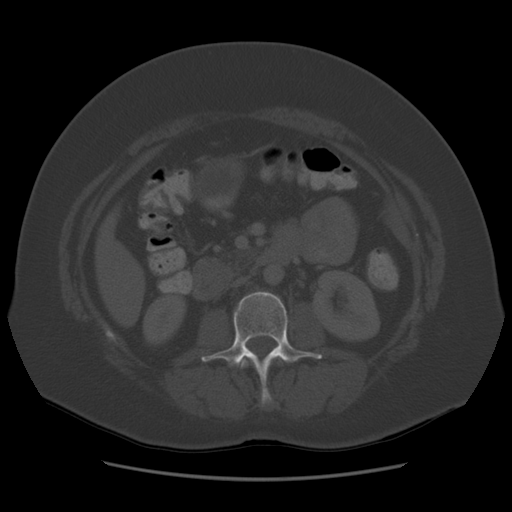
[im 53/82  soft-tissue]
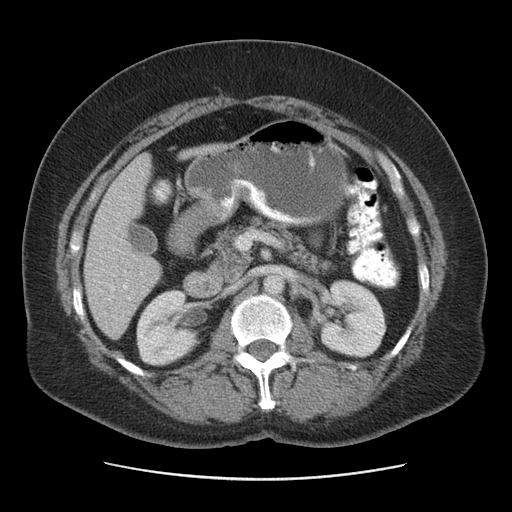
[im 62/82  soft-tissue]
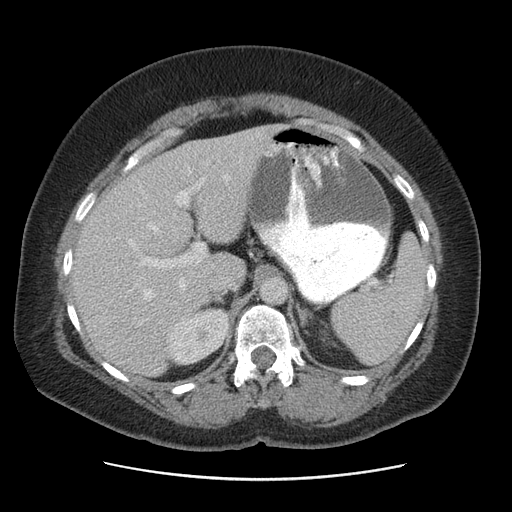
[im 67/82  soft-tissue]
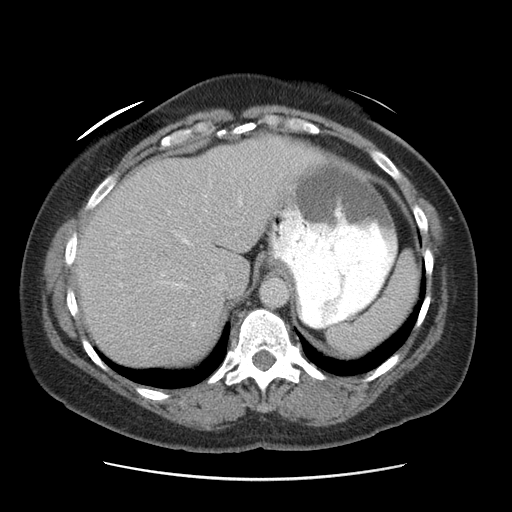
[im 72/82  soft-tissue]
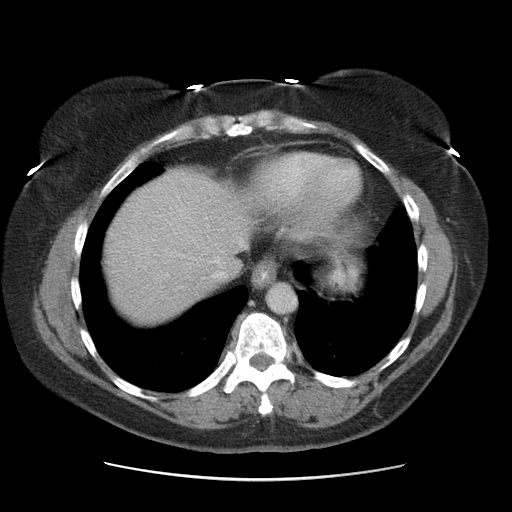
[im 77/82  soft-tissue]
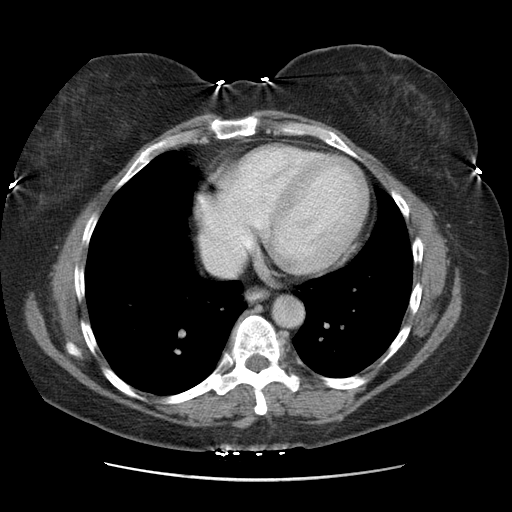

[Series 400: cor · coronal · 0.94mm/px · 3 of 138 slices shown]
[im 46/138  soft-tissue]
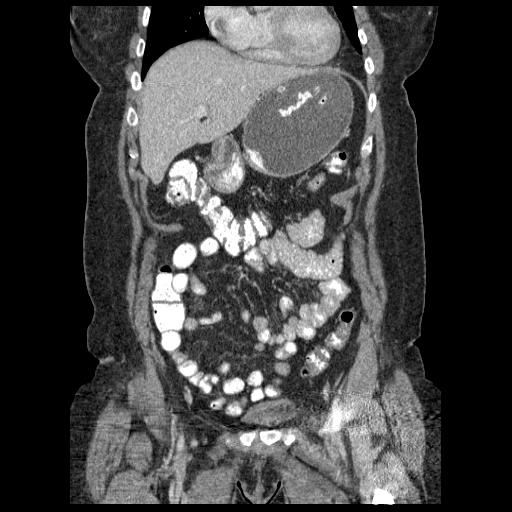
[im 61/138  soft-tissue]
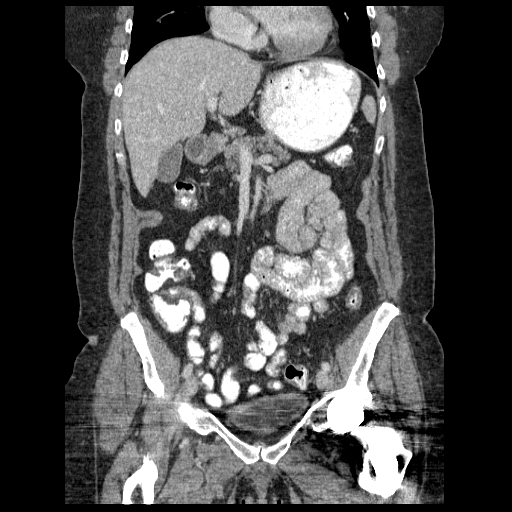
[im 77/138  soft-tissue]
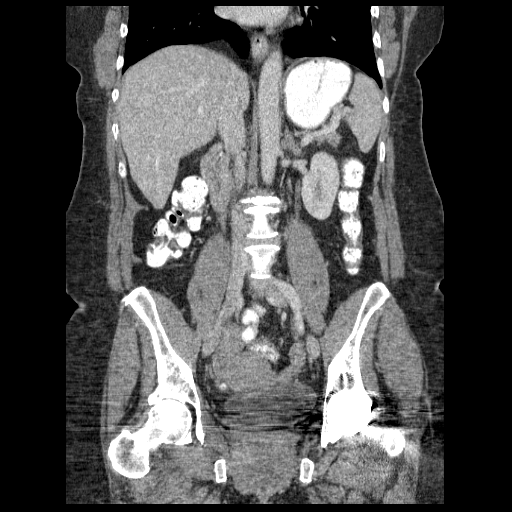

[17 of 46 positions shown; findings below may reference images not displayed]

FINDINGS: No extra luminal bowel inflammatory process, free fluid or free air.
No bowel containing hernia.

No worrisome hepatic, splenic, pancreatic, adrenal or renal lesion.
No calcified gallstones.

Lung bases clear.  Heart slightly enlarged.

No abdominal aortic aneurysm.

No adenopathy.

Slightly lobulated uterus tilted to the right. Tiny calcifications
associated with the ovaries may be vascular or related to ovaries
themselves.

Post left hip replacement causing streak artifact in the region of
pelvis. Right hip joint degenerative changes.

Moderate L2-3 disc space narrowing.

Limited evaluation urinary bladder without gross abnormality.
IMPRESSION: No acute abnormality detected as cause of patient's symptoms. Please
see above.

## 2015-06-10 ENCOUNTER — Other Ambulatory Visit (INDEPENDENT_AMBULATORY_CARE_PROVIDER_SITE_OTHER): Payer: Federal, State, Local not specified - PPO | Admitting: *Deleted

## 2015-06-10 DIAGNOSIS — I480 Paroxysmal atrial fibrillation: Secondary | ICD-10-CM | POA: Diagnosis not present

## 2015-06-10 LAB — BASIC METABOLIC PANEL
BUN: 22 mg/dL (ref 6–23)
CALCIUM: 9.6 mg/dL (ref 8.4–10.5)
CO2: 30 mEq/L (ref 19–32)
CREATININE: 1.14 mg/dL (ref 0.40–1.20)
Chloride: 99 mEq/L (ref 96–112)
GFR: 63.04 mL/min (ref >60.00)
Glucose, Bld: 99 mg/dL (ref 70–99)
Potassium: 4.1 mEq/L (ref 3.5–5.1)
Sodium: 136 mEq/L (ref 135–145)

## 2015-06-10 LAB — CBC WITH DIFFERENTIAL/PLATELET
BASOS PCT: 0.3 % (ref 0.0–3.0)
Basophils Absolute: 0 10*3/uL (ref 0.0–0.1)
EOS ABS: 0.1 10*3/uL (ref 0.0–0.7)
EOS PCT: 1.1 % (ref 0.0–5.0)
HCT: 37.4 % (ref 36.0–46.0)
Hemoglobin: 12.6 g/dL (ref 12.0–15.0)
LYMPHS ABS: 2.4 10*3/uL (ref 0.7–4.0)
Lymphocytes Relative: 42.2 % (ref 12.0–46.0)
MCHC: 33.7 g/dL (ref 30.0–36.0)
MCV: 97.6 fl (ref 78.0–100.0)
MONO ABS: 0.6 10*3/uL (ref 0.1–1.0)
Monocytes Relative: 10 % (ref 3.0–12.0)
Neutro Abs: 2.7 10*3/uL (ref 1.4–7.7)
Neutrophils Relative %: 46.4 % (ref 43.0–77.0)
Platelets: 221 10*3/uL (ref 150.0–400.0)
RBC: 3.84 Mil/uL — ABNORMAL LOW (ref 3.87–5.11)
RDW: 14.5 % (ref 11.5–15.5)
WBC: 5.7 10*3/uL (ref 4.0–10.5)

## 2015-06-11 ENCOUNTER — Telehealth: Payer: Self-pay | Admitting: Internal Medicine

## 2015-06-11 NOTE — Telephone Encounter (Signed)
Walk in pt form-pt needs dates of Surgery/dates of out of Work-gave to [employer]

## 2015-06-17 ENCOUNTER — Encounter (HOSPITAL_COMMUNITY): Payer: Self-pay

## 2015-06-17 ENCOUNTER — Ambulatory Visit (HOSPITAL_COMMUNITY)
Admission: RE | Admit: 2015-06-17 | Discharge: 2015-06-18 | Disposition: A | Discharge status: Home / Self Care | Payer: Federal, State, Local not specified - PPO | Source: Ambulatory Visit | Attending: Internal Medicine | Admitting: Internal Medicine

## 2015-06-17 ENCOUNTER — Ambulatory Visit (HOSPITAL_COMMUNITY)
Admission: RE | Admit: 2015-06-17 | Discharge: 2015-06-17 | Disposition: A | Discharge status: Home / Self Care | Payer: Federal, State, Local not specified - PPO | Source: Ambulatory Visit | Attending: Internal Medicine | Admitting: Internal Medicine

## 2015-06-17 ENCOUNTER — Encounter (HOSPITAL_COMMUNITY)
Admission: RE | Disposition: A | Discharge status: Home / Self Care | Payer: Self-pay | Source: Ambulatory Visit | Attending: Internal Medicine

## 2015-06-17 ENCOUNTER — Ambulatory Visit (HOSPITAL_COMMUNITY): Payer: Federal, State, Local not specified - PPO | Admitting: Anesthesiology

## 2015-06-17 DIAGNOSIS — I10 Essential (primary) hypertension: Secondary | ICD-10-CM | POA: Diagnosis not present

## 2015-06-17 DIAGNOSIS — I480 Paroxysmal atrial fibrillation: Secondary | ICD-10-CM | POA: Insufficient documentation

## 2015-06-17 DIAGNOSIS — I4891 Unspecified atrial fibrillation: Secondary | ICD-10-CM | POA: Diagnosis present

## 2015-06-17 DIAGNOSIS — Z6829 Body mass index (BMI) 29.0-29.9, adult: Secondary | ICD-10-CM | POA: Diagnosis not present

## 2015-06-17 DIAGNOSIS — Z79899 Other long term (current) drug therapy: Secondary | ICD-10-CM | POA: Diagnosis not present

## 2015-06-17 DIAGNOSIS — Z7901 Long term (current) use of anticoagulants: Secondary | ICD-10-CM | POA: Diagnosis not present

## 2015-06-17 DIAGNOSIS — Z87891 Personal history of nicotine dependence: Secondary | ICD-10-CM | POA: Diagnosis not present

## 2015-06-17 DIAGNOSIS — E669 Obesity, unspecified: Secondary | ICD-10-CM | POA: Diagnosis not present

## 2015-06-17 DIAGNOSIS — M1990 Unspecified osteoarthritis, unspecified site: Secondary | ICD-10-CM | POA: Diagnosis not present

## 2015-06-17 HISTORY — PX: TEE WITHOUT CARDIOVERSION: SHX5443

## 2015-06-17 HISTORY — PX: ELECTROPHYSIOLOGIC STUDY: SHX172A

## 2015-06-17 LAB — POCT ACTIVATED CLOTTING TIME
ACTIVATED CLOTTING TIME: 306 s
ACTIVATED CLOTTING TIME: 307 s
ACTIVATED CLOTTING TIME: 177 s
Activated Clotting Time: 270 seconds
Activated Clotting Time: 190 seconds

## 2015-06-17 LAB — MRSA PCR SCREENING: MRSA BY PCR: NEGATIVE

## 2015-06-17 SURGERY — ECHOCARDIOGRAM, TRANSESOPHAGEAL
Anesthesia: Moderate Sedation

## 2015-06-17 SURGERY — ATRIAL FIBRILLATION ABLATION
Anesthesia: General

## 2015-06-17 MED ORDER — EPHEDRINE SULFATE 50 MG/ML IJ SOLN
INTRAMUSCULAR | Status: DC | PRN
  Administered 2015-06-17: 5 mg via INTRAVENOUS

## 2015-06-17 MED ORDER — DEXAMETHASONE SODIUM PHOSPHATE 4 MG/ML IJ SOLN
INTRAMUSCULAR | Status: DC | PRN
  Administered 2015-06-17: 4 mg via INTRAVENOUS

## 2015-06-17 MED ORDER — LACTATED RINGERS IV SOLN
INTRAVENOUS | Status: DC | PRN
  Administered 2015-06-17 (×2): via INTRAVENOUS

## 2015-06-17 MED ORDER — MIDAZOLAM HCL 10 MG/2ML IJ SOLN
INTRAMUSCULAR | Status: DC | PRN
  Administered 2015-06-17: 2 mg via INTRAVENOUS
  Administered 2015-06-17: 1 mg via INTRAVENOUS
  Administered 2015-06-17: 2 mg via INTRAVENOUS

## 2015-06-17 MED ORDER — FENTANYL CITRATE (PF) 100 MCG/2ML IJ SOLN
INTRAMUSCULAR | Status: DC | PRN
  Administered 2015-06-17 (×8): 50 ug via INTRAVENOUS

## 2015-06-17 MED ORDER — RIVAROXABAN 20 MG PO TABS
20.0000 mg | ORAL_TABLET | Freq: Every day | ORAL | Status: DC
  Administered 2015-06-17: 20 mg via ORAL
  Filled 2015-06-17 (×2): qty 1

## 2015-06-17 MED ORDER — FENTANYL CITRATE (PF) 100 MCG/2ML IJ SOLN
INTRAMUSCULAR | Status: AC
  Filled 2015-06-17: qty 2

## 2015-06-17 MED ORDER — FENTANYL CITRATE (PF) 100 MCG/2ML IJ SOLN
25.0000 ug | Freq: Once | INTRAMUSCULAR | Status: AC
  Administered 2015-06-17: 25 ug via INTRAVENOUS

## 2015-06-17 MED ORDER — SODIUM CHLORIDE 0.9 % IJ SOLN
3.0000 mL | Freq: Two times a day (BID) | INTRAMUSCULAR | Status: DC
  Administered 2015-06-17 – 2015-06-18 (×2): 3 mL via INTRAVENOUS

## 2015-06-17 MED ORDER — MIDAZOLAM HCL 5 MG/5ML IJ SOLN
INTRAMUSCULAR | Status: DC | PRN
  Administered 2015-06-17: 2 mg via INTRAVENOUS

## 2015-06-17 MED ORDER — HYDROCHLOROTHIAZIDE 25 MG PO TABS
25.0000 mg | ORAL_TABLET | Freq: Every day | ORAL | Status: DC
  Administered 2015-06-18: 25 mg via ORAL
  Filled 2015-06-17: qty 1

## 2015-06-17 MED ORDER — PROPOFOL 10 MG/ML IV BOLUS
INTRAVENOUS | Status: DC | PRN
  Administered 2015-06-17: 20 mg via INTRAVENOUS
  Administered 2015-06-17: 300 mg via INTRAVENOUS
  Administered 2015-06-17: 20 mg via INTRAVENOUS
  Administered 2015-06-17: 30 mg via INTRAVENOUS

## 2015-06-17 MED ORDER — SODIUM CHLORIDE 0.9 % IV SOLN
INTRAVENOUS | Status: DC
  Administered 2015-06-17: 500 mL via INTRAVENOUS

## 2015-06-17 MED ORDER — FENTANYL CITRATE (PF) 100 MCG/2ML IJ SOLN
25.0000 ug | INTRAMUSCULAR | Status: AC | PRN
  Administered 2015-06-17: 25 ug via INTRAVENOUS

## 2015-06-17 MED ORDER — TELMISARTAN-HCTZ 80-25 MG PO TABS: 1.0000 | ORAL_TABLET | Freq: Every day | ORAL | Status: DC

## 2015-06-17 MED ORDER — HYDROCODONE-ACETAMINOPHEN 5-325 MG PO TABS
1.0000 | ORAL_TABLET | ORAL | Status: DC | PRN
  Administered 2015-06-17: 1 via ORAL
  Filled 2015-06-17: qty 1

## 2015-06-17 MED ORDER — SODIUM CHLORIDE 0.9 % IV SOLN: 250.0000 mL | INTRAVENOUS | Status: DC | PRN

## 2015-06-17 MED ORDER — HEPARIN SODIUM (PORCINE) 1000 UNIT/ML IJ SOLN
INTRAMUSCULAR | Status: DC | PRN
  Administered 2015-06-17: 12000 [IU] via INTRAVENOUS

## 2015-06-17 MED ORDER — ONDANSETRON HCL 4 MG/2ML IJ SOLN: 4.0000 mg | Freq: Four times a day (QID) | INTRAMUSCULAR | Status: DC | PRN

## 2015-06-17 MED ORDER — DOBUTAMINE IN D5W 4-5 MG/ML-% IV SOLN
INTRAVENOUS | Status: DC | PRN
  Administered 2015-06-17: 20 ug/kg/min via INTRAVENOUS

## 2015-06-17 MED ORDER — HYDRALAZINE HCL 20 MG/ML IJ SOLN
10.0000 mg | Freq: Once | INTRAMUSCULAR | Status: AC
  Administered 2015-06-17: 10 mg via INTRAVENOUS
  Filled 2015-06-17: qty 1

## 2015-06-17 MED ORDER — FENTANYL CITRATE (PF) 100 MCG/2ML IJ SOLN
INTRAMUSCULAR | Status: DC | PRN
  Administered 2015-06-17 (×2): 25 ug via INTRAVENOUS

## 2015-06-17 MED ORDER — BUTAMBEN-TETRACAINE-BENZOCAINE 2-2-14 % EX AERO
INHALATION_SPRAY | CUTANEOUS | Status: DC | PRN
  Administered 2015-06-17: 2 via TOPICAL

## 2015-06-17 MED ORDER — BUPIVACAINE HCL (PF) 0.25 % IJ SOLN
INTRAMUSCULAR | Status: DC | PRN
  Administered 2015-06-17: 12 mL

## 2015-06-17 MED ORDER — HEPARIN SODIUM (PORCINE) 1000 UNIT/ML IJ SOLN
INTRAMUSCULAR | Status: AC
  Filled 2015-06-17: qty 1

## 2015-06-17 MED ORDER — SODIUM CHLORIDE 0.9 % IJ SOLN: 3.0000 mL | INTRAMUSCULAR | Status: DC | PRN

## 2015-06-17 MED ORDER — ACETAMINOPHEN 325 MG PO TABS
650.0000 mg | ORAL_TABLET | ORAL | Status: DC | PRN
  Administered 2015-06-18: 650 mg via ORAL
  Filled 2015-06-17: qty 2

## 2015-06-17 MED ORDER — IOHEXOL 350 MG/ML SOLN
INTRAVENOUS | Status: DC | PRN
  Administered 2015-06-17: 105 mL

## 2015-06-17 MED ORDER — MIDAZOLAM HCL 5 MG/ML IJ SOLN
INTRAMUSCULAR | Status: AC
  Filled 2015-06-17: qty 2

## 2015-06-17 MED ORDER — PROTAMINE SULFATE 10 MG/ML IV SOLN
INTRAVENOUS | Status: DC | PRN
  Administered 2015-06-17 (×3): 10 mg via INTRAVENOUS

## 2015-06-17 MED ORDER — DIPHENHYDRAMINE HCL 50 MG/ML IJ SOLN
INTRAMUSCULAR | Status: AC
  Filled 2015-06-17: qty 1

## 2015-06-17 MED ORDER — HEPARIN SODIUM (PORCINE) 1000 UNIT/ML IJ SOLN
INTRAMUSCULAR | Status: DC | PRN
  Administered 2015-06-17: 1000 [IU] via INTRAVENOUS
  Administered 2015-06-17: 3000 [IU] via INTRAVENOUS
  Administered 2015-06-17: 12000 [IU] via INTRAVENOUS

## 2015-06-17 MED ORDER — BUPIVACAINE HCL (PF) 0.25 % IJ SOLN
INTRAMUSCULAR | Status: AC
  Filled 2015-06-17: qty 30

## 2015-06-17 MED ORDER — DOBUTAMINE IN D5W 4-5 MG/ML-% IV SOLN
INTRAVENOUS | Status: AC
  Filled 2015-06-17: qty 250

## 2015-06-17 MED ORDER — LIDOCAINE HCL (CARDIAC) 20 MG/ML IV SOLN
INTRAVENOUS | Status: DC | PRN
  Administered 2015-06-17: 60 mg via INTRAVENOUS

## 2015-06-17 MED ORDER — ONDANSETRON HCL 4 MG/2ML IJ SOLN
INTRAMUSCULAR | Status: DC | PRN
  Administered 2015-06-17: 4 mg via INTRAVENOUS

## 2015-06-17 MED ORDER — IRBESARTAN 300 MG PO TABS
300.0000 mg | ORAL_TABLET | Freq: Every day | ORAL | Status: DC
  Administered 2015-06-18: 300 mg via ORAL
  Filled 2015-06-17: qty 1

## 2015-06-17 MED ORDER — IRBESARTAN 150 MG PO TABS
150.0000 mg | ORAL_TABLET | Freq: Once | ORAL | Status: AC
Start: 2015-06-17 — End: 2015-06-17
  Administered 2015-06-17: 150 mg via ORAL
  Filled 2015-06-17: qty 1

## 2015-06-17 SURGICAL SUPPLY — 21 items
BAG SNAP BAND KOVER 36X36 (MISCELLANEOUS) ×2 IMPLANT
BLANKET WARM UNDERBOD FULL ACC (MISCELLANEOUS) ×2 IMPLANT
CATH DIAG 6FR PIGTAIL (CATHETERS) ×2 IMPLANT
CATH NAVISTAR SMARTTOUCH DF (ABLATOR) ×2 IMPLANT
CATH SOUNDSTAR 3D IMAGING (CATHETERS) ×2 IMPLANT
CATH VARIABLE LASSO NAV 2515 (CATHETERS) ×2 IMPLANT
CATH WEBSTER BI DIR CS D-F CRV (CATHETERS) ×2 IMPLANT
COVER SWIFTLINK CONNECTOR (BAG) ×2 IMPLANT
NEEDLE TRANSEP BRK 71CM 407200 (NEEDLE) ×2 IMPLANT
PACK EP LATEX FREE (CUSTOM PROCEDURE TRAY) ×1
PACK EP LF (CUSTOM PROCEDURE TRAY) ×1 IMPLANT
PAD DEFIB LIFELINK (PAD) ×2 IMPLANT
PATCH CARTO3 (PAD) ×2 IMPLANT
SHEATH AVANTI 11F 11CM (SHEATH) ×2 IMPLANT
SHEATH PINNACLE 7F 10CM (SHEATH) ×4 IMPLANT
SHEATH PINNACLE 9F 10CM (SHEATH) ×2 IMPLANT
SHEATH SWARTZ TS SL2 63CM 8.5F (SHEATH) ×2 IMPLANT
SHIELD RADPAD SCOOP 12X17 (MISCELLANEOUS) ×2 IMPLANT
SYR MEDRAD MARK V 150ML (SYRINGE) ×2 IMPLANT
TUBING CONTRAST HIGH PRESS 48 (TUBING) ×2 IMPLANT
TUBING SMART ABLATE COOLFLOW (TUBING) ×2 IMPLANT

## 2015-06-17 NOTE — Progress Notes (Signed)
Paged by nurse, patient BP of 179/96. Per nurse patient hasn't received his BP medication today. Will give Avapro  now and continue antihypertensive as schedule.   Quentina Fronek, PA-C

## 2015-06-17 NOTE — Transfer of Care (Signed)
Immediate Anesthesia Transfer of Care Note  Patient: Jill Escobar  Procedure(s) Performed: Procedure(s): Atrial Fibrillation Ablation (N/A)  Patient Location: Cath Lab  Anesthesia Type:General  Level of Consciousness: awake, oriented and patient cooperative  Airway & Oxygen Therapy: Patient Spontanous Breathing and Patient connected to face mask oxygen  Post-op Assessment: Report given to RN and Post -op Vital signs reviewed and stable  Post vital signs: Reviewed  Last Vitals:  Filed Vitals:   06/17/15 1130  BP:   Pulse: 57  Temp:   Resp: 18    Complications: No apparent anesthesia complications

## 2015-06-17 NOTE — Anesthesia Procedure Notes (Signed)
Procedure Name: LMA Insertion Date/Time: 06/17/2015 1:34 PM Performed by: Romie Minus K Pre-anesthesia Checklist: Patient identified, Emergency Drugs available, Suction available, Patient being monitored and Timeout performed Patient Re-evaluated:Patient Re-evaluated prior to inductionOxygen Delivery Method: Circle system utilized Preoxygenation: Pre-oxygenation with 100% oxygen Intubation Type: IV induction LMA: LMA inserted LMA Size: 4.0 Number of attempts: 1 Placement Confirmation: positive ETCO2,  CO2 detector and breath sounds checked- equal and bilateral Tube secured with: Tape Dental Injury: Teeth and Oropharynx as per pre-operative assessment

## 2015-06-17 NOTE — Anesthesia Preprocedure Evaluation (Addendum)
Anesthesia Evaluation  Patient identified by MRN, date of birth, ID band Patient awake    Reviewed: Allergy & Precautions, NPO status , Patient's Chart, lab work & pertinent test results  History of Anesthesia Complications Negative for: history of anesthetic complications  Airway Mallampati: III  TM Distance: >3 FB Neck ROM: Full    Dental  (+) Dental Advisory Given, Teeth Intact   Pulmonary former smoker,  breath sounds clear to auscultation        Cardiovascular hypertension, Pt. on medications + dysrhythmias Atrial Fibrillation Rhythm:Regular Rate:Normal     Neuro/Psych negative neurological ROS     GI/Hepatic negative GI ROS, Neg liver ROS,   Endo/Other  negative endocrine ROS  Renal/GU negative Renal ROS     Musculoskeletal  (+) Arthritis -,   Abdominal   Peds  Hematology negative hematology ROS (+)   Anesthesia Other Findings   Reproductive/Obstetrics                           Anesthesia Physical Anesthesia Plan  ASA: II  Anesthesia Plan: General   Post-op Pain Management:    Induction: Intravenous  Airway Management Planned: LMA  Additional Equipment:   Intra-op Plan:   Post-operative Plan: Extubation in OR  Informed Consent: I have reviewed the patients History and Physical, chart, labs and discussed the procedure including the risks, benefits and alternatives for the proposed anesthesia with the patient or authorized representative who has indicated his/her understanding and acceptance.   Dental advisory given  Plan Discussed with: CRNA  Anesthesia Plan Comments:         Anesthesia Quick Evaluation

## 2015-06-17 NOTE — Discharge Instructions (Signed)
No driving for 4 days. No lifting over 5 lbs for 1 week. No sexual activity for 1 week. You may return to work in 1 week. Keep procedure site clean & dry. If you notice increased pain, swelling, bleeding or pus, call/return!  You may shower, but no soaking baths/hot tubs/pools for 1 week.  ° ° °

## 2015-06-17 NOTE — H&P (Addendum)
PCP: Georgianne Fick, MD Cardiologist: Dr Anne Fu Primary Electrophysiologist: Hillis Range, MD   Chief Complaint  Patient presents with  . PAF    History of Present Illness: Jill Escobar is a 59 y.o. female who presents today for EPS and ablation. The patient has symptomatic paroxysmal atrial fibrillation. She reports symptoms of palpitations and fatigue. She feels "washed out" with afib. She finds her afib to be quite disturbing. She had had nocturnal pauses for which metoprolol has been previously discontinued. She has tried flecainide without resolution. Presently episodes occur several times per month and seem to cause significant anxiety.   Today, she denies symptoms of palpitations, chest pain, shortness of breath, orthopnea, PND, lower extremity edema, claudication, dizziness, presyncope, syncope, bleeding, or neurologic sequela. The patient is tolerating medications without difficulties and is otherwise without complaint today.    Past Medical History  Diagnosis Date  . Osteoarthritis     L hip  . HTN (hypertension)   . Atrial fibrillation   . Hypokalemia   . Overweight    Past Surgical History  Procedure Laterality Date  . Vesicovaginal fistula closure w/ tah    . D&c and removal of endonetrial polyp    . Laparoscopy      with lysis of pelvic adhesions   . Drainage of cyst      follicular functional-type of cysyt- L ovary   . Uterosacral nerve ablation      with bipolar coagulation. Surgeon Dr. Jennette Kettle  . Nasal endoscopy      upper  . Tee without cardioversion N/A 04/14/2015    Procedure: TRANSESOPHAGEAL ECHOCARDIOGRAM (TEE); Surgeon: Jake Bathe, MD; Location: [facility] ENDOSCOPY; Service: Cardiovascular; Laterality: N/A;     Current Outpatient Prescriptions  Medication Sig Dispense Refill  . diltiazem (TAZTIA XT) 360 MG 24 hr capsule TAKE ONE CAPSULE BY MOUTH  EVERY DAY 90 capsule 3  . flecainide (TAMBOCOR) 100 MG tablet Take 1 tablet (100 mg total) by mouth 2 (two) times daily. 60 tablet 11  . Multiple Vitamin (MULTIVITAMIN) tablet Take 1 tablet by mouth daily.     . rivaroxaban (XARELTO) 20 MG TABS tablet Take 1 tablet (20 mg total) by mouth daily with supper. 30 tablet 11  . telmisartan-hydrochlorothiazide (MICARDIS HCT) 80-25 MG per tablet TAKE ONE TABLET BY MOUTH ONCE DAILY 90 tablet 0   Current Facility-Administered Medications  Medication Dose Route Frequency Provider Last Rate Last Dose  . cloNIDine (CATAPRES) tablet 0.1 mg 0.1 mg Oral Once Rosalio Macadamia, NP      Allergies: Review of patient's allergies indicates no known allergies.   Social History: The patient  reports that she has quit smoking. She does not have any smokeless tobacco history on file. She reports that she drinks alcohol. She reports that she does not use illicit drugs.   Family History: The patient's family history includes Cancer in her father; Other in her mother.    ROS: Please see the history of present illness. All other systems are reviewed and negative.    PHYSICAL EXAM: Vitals-- pending GEN: Well nourished, well developed, in no acute distress  HEENT: normal  Neck: no JVD, carotid bruits, or masses Cardiac: RRR; no murmurs, rubs, or gallops,no edema  Respiratory: clear to auscultation bilaterally, normal work of breathing GI: soft, nontender, nondistended, + BS MS: no deformity or atrophy  Skin: warm and dry  Neuro: Strength and sensation are intact Psych: euthymic mood, full affect   Recent Labs: reviewed  Lipid Panel  Labs (Brief)       Component Value Date/Time   CHOL * 05/12/2010 0347    206  ATP III CLASSIFICATION: <200 mg/dL Desirable 161-096 mg/dL Borderline High >=045 mg/dL High     TRIG 40 40/98/1191 0347   HDL 96  05/12/2010 0347   CHOLHDL 2.1 05/12/2010 0347   VLDL 8 05/12/2010 0347   LDLCALC * 05/12/2010 0347    102  Total Cholesterol/HDL:CHD Risk Coronary Heart Disease Risk Table  Men Women 1/2 Average Risk 3.4 3.3 Average Risk 5.0 4.4 2 X Average Risk 9.6 7.1 3 X Average Risk 23.4 11.0   Use the calculated Patient Ratio above and the CHD Risk Table to determine the patient's CHD Risk.   ATP III CLASSIFICATION (LDL): <100 mg/dL Optimal 478-295 mg/dL Near or Above  Optimal 130-159 mg/dL Borderline 621-308 mg/dL High >657 mg/dL Very High       Wt Readings from Last 3 Encounters:  04/30/15 89.812 kg (198 lb)  04/14/15 88.451 kg (195 lb)  04/09/15 88.905 kg (196 lb)      Other studies Reviewed: Additional studies/ records that were reviewed today include: Dr Anne Fu notes  Review of the above records today demonstrates: previously, echos have shown a left atrial lesion. Recently a TEE was performed which documented this to actually be a prominent ridge of the appendage.   ASSESSMENT AND PLAN:  1. Paroxysmal atrial fibrillation The patient has symptomatic paroxysmal atrial fibrillation. She has failed medical therapy with flecainide and diltiazem. Therapeutic strategies for afib including medicine and ablation were discussed in detail with the patient today. Risk, benefits, and alternatives to EP study and radiofrequency ablation for afib were also discussed in detail today. These risks include but are not limited to stroke, bleeding, vascular damage, tamponade, perforation, damage to the esophagus, lungs, and other structures, pulmonary vein stenosis, worsening renal function, and death. The patient understands these risk and wishes to proceed. chads2vasc score is at least 2. Continue on anticoagulation  Hillis Range MD, [facility] 06/17/2015 8:05  AM         HPI reviewed and no changes to the above.  Planning for TEE for pre atrial fibrillation ablation.

## 2015-06-17 NOTE — Discharge Summary (Signed)
ELECTROPHYSIOLOGY PROCEDURE DISCHARGE SUMMARY    Patient ID: Jill Escobar,  MRN: [id number], DOB/AGE: [birth year] 59 y.o.  Admit date: 06/17/2015 Discharge date: 06/18/2015  Primary Care Physician: Georgianne Fick, MD Primary Cardiologist: Anne Fu Electrophysiologist: Hillis Range, MD  Primary Discharge Diagnosis:  Paroxysmal atrial fibrillation status post ablation this admission  Secondary Discharge Diagnosis:  1.  Hypertension 2.   Obesity 3.  Osteoarthritis  Procedures This Admission:  1.  Electrophysiology study and radiofrequency catheter ablation on 06/17/15 by Dr Hillis Range.  This study demonstrated sinus rhythm upon presentation; rotational Angiography reveals a moderate sized left atrium with a common ostium to the left superior and inferior pulmonary veins; successful electrical isolation and anatomical encircling of all four pulmonary veins with radiofrequency current; no inducible arrhythmias following ablation both on and off of dobutamine; no early apparent complications  Brief HPI: Jill Escobar is a 59 y.o. female with a history of paroxysmal atrial fibrillation.  They have failed medical therapy with flecainide. Risks, benefits, and alternatives to catheter ablation of atrial fibrillation were reviewed with the patient who wished to proceed.  The patient underwent TEE prior to the procedure which demonstrated normal LV function and no LAA thrombus.    Hospital Course:  The patient was admitted and underwent EPS/RFCA of atrial fibrillation with details as outlined above.  They were monitored on telemetry overnight which demonstrated sinus rhythm.  Groin was without complication on the day of discharge.  The patient was examined and considered to be stable for discharge.  Wound care and restrictions were reviewed with the patient.  The patient will be seen back by the AF clinic in 4 weeks and Dr Johney Frame in 12 weeks for post ablation follow up.   This patients  CHA2DS2-VASc Score is 2   Physical Exam: Filed Vitals:   06/18/15 0700 06/18/15 0745 06/18/15 0800 06/18/15 0900  BP: 161/99 172/99 193/111 128/82  Pulse: 83 82 88 92  Temp:      TempSrc:      Resp: Height:      Weight:      SpO2: 97% 99% 100% 100%    GEN- The patient is well appearing, alert and oriented x 3 today.   HEENT: normocephalic, atraumatic; sclera clear, conjunctiva pink; hearing intact; oropharynx clear; neck supple, no JVP Lymph- no cervical lymphadenopathy Lungs- Clear to ausculation bilaterally, normal work of breathing.  No wheezes, rales, rhonchi Heart- Regular rate and rhythm, no murmurs, rubs or gallops, PMI not laterally displaced GI- soft, non-tender, non-distended, bowel sounds present, no hepatosplenomegaly Extremities- no clubbing, cyanosis, or edema; DP/PT/radial pulses 2+ bilaterally, groin without hematoma/bruit MS- no significant deformity or atrophy Skin- warm and dry, no rash or lesion Psych- euthymic mood, full affect Neuro- strength and sensation are intact   Labs:   Lab Results  Component Value Date   WBC 5.7 06/10/2015   HGB 12.6 06/10/2015   HCT 37.4 06/10/2015   MCV 97.6 06/10/2015   PLT 221.0 06/10/2015     Recent Labs Lab 06/18/15 0245  NA 134*  K 3.9  CL 101  CO2 23  BUN 13  CREATININE 0.94  CALCIUM 9.5  GLUCOSE 154*     Discharge Medications:    Medication List    TAKE these medications        acetaminophen 500 MG tablet  Commonly known as:  TYLENOL  Take 1,000 mg by mouth at bedtime as needed (pain).     diltiazem  360 MG 24 hr capsule  Commonly known as:  TAZTIA XT  TAKE ONE CAPSULE BY MOUTH EVERY DAY     flecainide 100 MG tablet  Commonly known as:  TAMBOCOR  Take 1 tablet (100 mg total) by mouth 2 (two) times daily.     multivitamin with minerals Tabs tablet  Take 1 tablet by mouth daily.     pantoprazole 40 MG tablet  Commonly known as:  PROTONIX  Take 1 tablet (40 mg total) by mouth  daily.     rivaroxaban 20 MG Tabs tablet  Commonly known as:  XARELTO  Take 1 tablet (20 mg total) by mouth daily with supper.     telmisartan-hydrochlorothiazide 80-25 MG per tablet  Commonly known as:  MICARDIS HCT  TAKE ONE TABLET BY MOUTH ONCE DAILY        Disposition:   Follow-up Information    Follow up with [facility] On 07/15/2015.   Why:  at 11:30AM   Contact information:   [address] [place] [phone]      Follow up with Hillis Range, MD On 09/22/2015.   Specialty:  Cardiology   Why:  at 11:30AM   Contact information:   [address] [place] [address] [phone]       I was clear with patient that she could return to full activity including return to full work related activity in 7 days.   Duration of Discharge Encounter: Greater than 30 minutes including physician time.  Signed, Gypsy Balsam, NP 06/18/2015 9:47 AM  I have seen, examined the patient, and reviewed the above assessment and plan. On exam, RRR. Groin was sore but without hematoma/bruit.  Changes to above are made where necessary.    Co Sign: Hillis Range, MD 06/18/2015 9:47 AM

## 2015-06-17 NOTE — Progress Notes (Signed)
Venous sheaths removed from right femoral vein x3.  All intact.  Manual pressure held for 20 minutes. Hemostasis obtained.  Site a level 0.  Post sheath removal instructions provided with verbalization of understanding.

## 2015-06-17 NOTE — Progress Notes (Signed)
ACT drawn to assess for ability to remove sheaths.  ACT result was 190.

## 2015-06-17 NOTE — Anesthesia Postprocedure Evaluation (Signed)
  Anesthesia Post-op Note  Patient: Jill Escobar  Procedure(s) Performed: Procedure(s): Atrial Fibrillation Ablation (N/A)  Patient Location: PACU  Anesthesia Type:General  Level of Consciousness: awake and alert   Airway and Oxygen Therapy: Patient Spontanous Breathing  Post-op Pain: none  Post-op Assessment: Post-op Vital signs reviewed              Post-op Vital Signs: Reviewed  Last Vitals:  Filed Vitals:   06/17/15 1746  BP: 155/66  Pulse: 69  Temp:   Resp: 17    Complications: No apparent anesthesia complications

## 2015-06-17 NOTE — CV Procedure (Signed)
Transesophageal Echo  LVEF 55-60% No LA or LAA thrombus or mass Normal RV function and size  See full study report for further details.

## 2015-06-17 NOTE — Progress Notes (Signed)
Pt blood pressure still systolic 170s, called cards, orders to give hydralazine once. Will continue to monitor closely.

## 2015-06-17 NOTE — Progress Notes (Signed)
ACT reassessed at this time with results of 177.

## 2015-06-18 ENCOUNTER — Encounter (HOSPITAL_COMMUNITY): Payer: Self-pay | Admitting: Internal Medicine

## 2015-06-18 DIAGNOSIS — M1990 Unspecified osteoarthritis, unspecified site: Secondary | ICD-10-CM | POA: Diagnosis not present

## 2015-06-18 DIAGNOSIS — I10 Essential (primary) hypertension: Secondary | ICD-10-CM | POA: Diagnosis not present

## 2015-06-18 DIAGNOSIS — I480 Paroxysmal atrial fibrillation: Secondary | ICD-10-CM

## 2015-06-18 DIAGNOSIS — E669 Obesity, unspecified: Secondary | ICD-10-CM | POA: Diagnosis not present

## 2015-06-18 LAB — BASIC METABOLIC PANEL
ANION GAP: 10 (ref 5–15)
BUN: 13 mg/dL (ref 6–20)
CHLORIDE: 101 mmol/L (ref 101–111)
CO2: 23 mmol/L (ref 22–32)
Calcium: 9.5 mg/dL (ref 8.9–10.3)
Creatinine, Ser: 0.94 mg/dL (ref 0.44–1.00)
GFR calc Af Amer: >60 mL/min (ref >60)
GLUCOSE: 154 mg/dL — AB (ref 65–99)
POTASSIUM: 3.9 mmol/L (ref 3.5–5.1)
SODIUM: 134 mmol/L — AB (ref 135–145)

## 2015-06-18 MED ORDER — PANTOPRAZOLE SODIUM 40 MG PO TBEC: 40.0000 mg | DELAYED_RELEASE_TABLET | Freq: Every day | ORAL | Status: DC

## 2015-07-02 ENCOUNTER — Telehealth: Payer: Self-pay | Admitting: Internal Medicine

## 2015-07-02 ENCOUNTER — Encounter (HOSPITAL_COMMUNITY): Payer: Self-pay | Admitting: Nurse Practitioner

## 2015-07-02 ENCOUNTER — Ambulatory Visit (HOSPITAL_COMMUNITY)
Admission: RE | Admit: 2015-07-02 | Discharge: 2015-07-02 | Disposition: A | Discharge status: Home / Self Care | Payer: Federal, State, Local not specified - PPO | Source: Ambulatory Visit | Attending: Nurse Practitioner | Admitting: Nurse Practitioner

## 2015-07-02 ENCOUNTER — Ambulatory Visit (HOSPITAL_BASED_OUTPATIENT_CLINIC_OR_DEPARTMENT_OTHER)
Admission: RE | Admit: 2015-07-02 | Discharge: 2015-07-02 | Disposition: A | Discharge status: Home / Self Care | Payer: Federal, State, Local not specified - PPO | Source: Ambulatory Visit | Attending: Nurse Practitioner | Admitting: Nurse Practitioner

## 2015-07-02 VITALS — BP 160/76 | HR 72 | Ht 68.0 in | Wt 196.4 lb

## 2015-07-02 DIAGNOSIS — Z9889 Other specified postprocedural states: Secondary | ICD-10-CM | POA: Diagnosis not present

## 2015-07-02 DIAGNOSIS — I4891 Unspecified atrial fibrillation: Secondary | ICD-10-CM

## 2015-07-02 DIAGNOSIS — M79604 Pain in right leg: Secondary | ICD-10-CM | POA: Insufficient documentation

## 2015-07-02 DIAGNOSIS — I4819 Other persistent atrial fibrillation: Secondary | ICD-10-CM

## 2015-07-02 DIAGNOSIS — R1030 Lower abdominal pain, unspecified: Secondary | ICD-10-CM | POA: Diagnosis not present

## 2015-07-02 DIAGNOSIS — I481 Persistent atrial fibrillation: Secondary | ICD-10-CM | POA: Diagnosis not present

## 2015-07-02 DIAGNOSIS — R1031 Right lower quadrant pain: Secondary | ICD-10-CM

## 2015-07-02 NOTE — Telephone Encounter (Signed)
New Message  Pt called states that it is Painful and swollen at the cath site. Surgery was two seeks ago she says that she has a knott in that area and she is not sure if it is normal. Routing this message to triage.

## 2015-07-02 NOTE — Progress Notes (Signed)
VASCULAR LAB PRELIMINARY  PRELIMINARY  PRELIMINARY  PRELIMINARY  *PRELIMINARY RESULTS* Vascular Ultrasound Lower Extremity Arterial Duplex has been completed for evaluation of the right groin for pseudoaneurysm   Impression   : No evidence of pseudoaneurysm of the right lower extremity.  Jill Escobar, RVS 07/02/2015, 11:55 AM

## 2015-07-02 NOTE — Progress Notes (Signed)
Patient ID: Jill Escobar, female   DOB: August 16, 1956, 59 y.o.   MRN: [id number]     Primary Care Physician: Jill Fick, MD Referring Physician: Dr. Fredrik Escobar is a 59 y.o. female with a h/o persistent afib that had a afib ablation 2 weeks ago, 8/16.. She asked to be seen today for discomfort of rt groin site. She has returned to work this week and noticed soreness of rt groin site. When she palpated the area and felt a knot, she asked to be seen due to fear of blood clot. Denies any discomfort down rt leg, no change in sensation or temperature, no shooting pains down rt leg. Minimal resolving bruising. Otherwise has had some afib since the procedure but not sustained. In SR today.  Today, she denies symptoms of palpitations, chest pain, shortness of breath, orthopnea, PND, lower extremity edema, dizziness, presyncope, syncope, or neurologic sequela. The patient is tolerating medications without difficulties and is otherwise without complaint today.   Past Medical History  Diagnosis Date  . Osteoarthritis     L hip  . HTN (hypertension)   . Atrial fibrillation   . Hypokalemia   . Overweight    Past Surgical History  Procedure Laterality Date  . Vesicovaginal fistula closure w/ tah    . D&c and removal of endonetrial polyp    . Laparoscopy      with lysis of pelvic adhesions   . Drainage of cyst      follicular functional-type of cysyt- L ovary   . Uterosacral nerve ablation      with bipolar coagulation. Surgeon Dr. Jennette Escobar  . Nasal endoscopy      upper  . Tee without cardioversion N/A 04/14/2015    Procedure: TRANSESOPHAGEAL ECHOCARDIOGRAM (TEE);  Surgeon: Jill Bathe, MD;  Location: [facility] ENDOSCOPY;  Service: Cardiovascular;  Laterality: N/A;  . Joint replacement Left 2012    hip  . Electrophysiologic study N/A 06/17/2015    Procedure: Atrial Fibrillation Ablation;  Surgeon: Jill Range, MD;  Location: [facility] INVASIVE CV LAB;  Service: Cardiovascular;  Laterality:  N/A;  . Tee without cardioversion N/A 06/17/2015    Procedure: TRANSESOPHAGEAL ECHOCARDIOGRAM (TEE);  Surgeon: Jill Si, MD;  Location: [facility] END[facility]COPY;  Service: Cardiovascular;  Laterality: N/A;    Current Outpatient Prescriptions  Medication Sig Dispense Refill  . acetaminophen (TYLENOL) 500 MG tablet Take 1,000 mg by mouth at bedtime as needed (pain).    Marland Kitchen diltiazem (TAZTIA XT) 360 MG 24 hr capsule TAKE ONE CAPSULE BY MOUTH EVERY DAY (Patient taking differently: Take 360 mg by mouth daily. ) 90 capsule 3  . flecainide (TAMBOCOR) 100 MG tablet Take 1 tablet (100 mg total) by mouth 2 (two) times daily. 60 tablet 11  . Multiple Vitamin (MULTIVITAMIN WITH MINERALS) TABS tablet Take 1 tablet by mouth daily.    . pantoprazole (PROTONIX) 40 MG tablet Take 1 tablet (40 mg total) by mouth daily. 45 tablet 0  . rivaroxaban (XARELTO) 20 MG TABS tablet Take 1 tablet (20 mg total) by mouth daily with supper. 30 tablet 11  . telmisartan-hydrochlorothiazide (MICARDIS HCT) 80-25 MG per tablet TAKE ONE TABLET BY MOUTH ONCE DAILY 90 tablet 0   Current Facility-Administered Medications  Medication Dose Route Frequency Provider Last Rate Last Dose  . cloNIDine (CATAPRES) tablet 0.1 mg  0.1 mg Oral Once Jill Macadamia, NP        Allergies  Allergen Reactions  . Shellfish Allergy     Social  History   Social History  . Marital Status: Married    Spouse Name: N/A  . Number of Children: N/A  . Years of Education: N/A   Occupational History  . Not on file.   Social History Main Topics  . Smoking status: Former Games developer  . Smokeless tobacco: Not on file     Comment: has a 5 pack year hx. quit 4 years ago.   . Alcohol Use: Yes     Comment: occasional   . Drug Use: No  . Sexual Activity: Not Currently   Other Topics Concern  . Not on file   Social History Narrative   Lives in [place] with her husband. Has 2 children.    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    Walks her dog approx. 30 min/day. Has no  specific diet     Family History  Problem Relation Age of Onset  . Cancer Father   . Other Mother     MVA    ROS- All systems are reviewed and negative except as per the HPI above  Physical Exam: Filed Vitals:   07/02/15 1054  BP: 160/76  Pulse: 72  Height: 5\' 8"  (1.727 m)  Weight: 196 lb 6.4 oz (89.086 kg)    GEN- The patient is well appearing, alert and oriented x 3 today.   Head- normocephalic, atraumatic Eyes-  Sclera clear, conjunctiva pink Ears- hearing intact Oropharynx- clear Neck- supple, no JVP Lymph- no cervical lymphadenopathy Lungs- Clear to ausculation bilaterally, normal work of breathing Heart- Regular rate and rhythm, no murmurs, rubs or gallops, PMI not laterally displaced GI- soft, NT, ND, + BS Extremities- no clubbing, cyanosis, or edema. Rt groin sight with minimal resolving bruising.Hardness about the size of a kidney bean present with tenderness on palpation. Pulse presentt without bruit. Temperature sensation of rt leg normal. MS- no significant deformity or atrophy Skin- no rash or lesion Psych- euthymic mood, full affect Neuro- strength and sensation are intact  EKG- not obtained  Assessment and Plan: 1. 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 I tried to reassure her, but she looked quite concerned. Ordered ultasound of rt groin today which did not show any pseudoaneurysm nor other abnormalities. She was told she could use some moist warm heat to the area and use tylenol for mild pain. The discomfort should improve and if not contact me prior to f/u recheck here 9/13.  Jill Escobar [facility] [address] [place], [place] [address] [phone]

## 2015-07-02 NOTE — Telephone Encounter (Signed)
Pt states she found a knot about 5 days ago in right groin at site of catheter insertion for a fib ablation done  06/17/15. Pt states she did not notice knot until 5 days ago,it is sore and tender to touch/painful, it is about the size of a quarter, insertion site  appears to be healed/ no drainage, no s/s of infection, some old bruising, no new bruising, no numbness/tingling/pain in right leg /foot or toes.   Pt advised I would forward to Dr Johney Frame for review. Pt states she is concerned about it and is requesting appt to have right groin checked today.   Pt scheduled to see Rudi Coco, NP in [facility] today at 11AM. Pt advised.

## 2015-07-07 ENCOUNTER — Other Ambulatory Visit: Payer: Self-pay | Admitting: Nurse Practitioner

## 2015-07-09 ENCOUNTER — Other Ambulatory Visit: Payer: Self-pay

## 2015-07-09 MED ORDER — TELMISARTAN-HCTZ 80-25 MG PO TABS: 1.0000 | ORAL_TABLET | Freq: Every day | ORAL | Status: DC

## 2015-07-15 ENCOUNTER — Encounter (HOSPITAL_COMMUNITY): Payer: Self-pay | Admitting: Nurse Practitioner

## 2015-07-15 ENCOUNTER — Ambulatory Visit (HOSPITAL_COMMUNITY)
Admission: RE | Admit: 2015-07-15 | Discharge: 2015-07-15 | Disposition: A | Discharge status: Home / Self Care | Payer: Federal, State, Local not specified - PPO | Source: Ambulatory Visit | Attending: Nurse Practitioner | Admitting: Nurse Practitioner

## 2015-07-15 VITALS — BP 178/106 | HR 56 | Ht 70.0 in | Wt 199.2 lb

## 2015-07-15 DIAGNOSIS — I481 Persistent atrial fibrillation: Secondary | ICD-10-CM | POA: Diagnosis not present

## 2015-07-15 DIAGNOSIS — I10 Essential (primary) hypertension: Secondary | ICD-10-CM | POA: Insufficient documentation

## 2015-07-15 DIAGNOSIS — I4819 Other persistent atrial fibrillation: Secondary | ICD-10-CM

## 2015-07-15 NOTE — Progress Notes (Signed)
Patient ID: Jill Escobar, female   DOB: 12-15-1955, 59 y.o.   MRN: [id number]     Primary Care Physician: Georgianne Fick, MD Referring Physician: Dr. Fredrik Cove is a 59 y.o. female with a h/o PVI ablation that is in afib clinic for one month follow up. She reports that rt groin site is improving with less discomfort. No swallowing difficulties. Very little  sensation of irregular heart beat. Maybe one to two episodes lasting 10-15 mins, that is much milder and of shorter duration than previous afib episodes. BP elevated today. She did take meds this am. Possibly more salt intake yesterday.  Today, she denies symptoms of palpitations, chest pain, shortness of breath, orthopnea, PND, lower extremity edema, dizziness, presyncope, syncope, or neurologic sequela. The patient is tolerating medications without difficulties and is otherwise without complaint today.   Past Medical History  Diagnosis Date  . Osteoarthritis     L hip  . HTN (hypertension)   . Atrial fibrillation   . Hypokalemia   . Overweight    Past Surgical History  Procedure Laterality Date  . Vesicovaginal fistula closure w/ tah    . D&c and removal of endonetrial polyp    . Laparoscopy      with lysis of pelvic adhesions   . Drainage of cyst      follicular functional-type of cysyt- L ovary   . Uterosacral nerve ablation      with bipolar coagulation. Surgeon Dr. Jennette Kettle  . Nasal endoscopy      upper  . Tee without cardioversion N/A 04/14/2015    Procedure: TRANSESOPHAGEAL ECHOCARDIOGRAM (TEE);  Surgeon: Jake Bathe, MD;  Location: [facility] ENDOSCOPY;  Service: Cardiovascular;  Laterality: N/A;  . Joint replacement Left 2012    hip  . Electrophysiologic study N/A 06/17/2015    Procedure: Atrial Fibrillation Ablation;  Surgeon: Hillis Range, MD;  Location: [facility] INVASIVE CV LAB;  Service: Cardiovascular;  Laterality: N/A;  . Tee without cardioversion N/A 06/17/2015    Procedure: TRANSESOPHAGEAL ECHOCARDIOGRAM  (TEE);  Surgeon: Chilton Si, MD;  Location: [facility] ENDOSCOPY;  Service: Cardiovascular;  Laterality: N/A;    Current Outpatient Prescriptions  Medication Sig Dispense Refill  . acetaminophen (TYLENOL) 500 MG tablet Take 1,000 mg by mouth at bedtime as needed (pain).    Marland Kitchen diltiazem (TAZTIA XT) 360 MG 24 hr capsule TAKE ONE CAPSULE BY MOUTH EVERY DAY (Patient taking differently: Take 360 mg by mouth daily. ) 90 capsule 3  . flecainide (TAMBOCOR) 100 MG tablet Take 1 tablet (100 mg total) by mouth 2 (two) times daily. 60 tablet 11  . Multiple Vitamin (MULTIVITAMIN WITH MINERALS) TABS tablet Take 1 tablet by mouth daily.    . pantoprazole (PROTONIX) 40 MG tablet Take 1 tablet (40 mg total) by mouth daily. 45 tablet 0  . rivaroxaban (XARELTO) 20 MG TABS tablet Take 1 tablet (20 mg total) by mouth daily with supper. 30 tablet 11  . telmisartan-hydrochlorothiazide (MICARDIS HCT) 80-25 MG per tablet Take 1 tablet by mouth daily. 90 tablet 1   Current Facility-Administered Medications  Medication Dose Route Frequency Provider Last Rate Last Dose  . cloNIDine (CATAPRES) tablet 0.1 mg  0.1 mg Oral Once Rosalio Macadamia, NP        Allergies  Allergen Reactions  . Shellfish Allergy     Social History   Social History  . Marital Status: Married    Spouse Name: N/A  . Number of Children: N/A  . Years  of Education: N/A   Occupational History  . Not on file.   Social History Main Topics  . Smoking status: Former Games developer  . Smokeless tobacco: Not on file     Comment: has a 5 pack year hx. quit 4 years ago.   . Alcohol Use: Yes     Comment: occasional   . Drug Use: No  . Sexual Activity: Not Currently   Other Topics Concern  . Not on file   Social History Narrative   Lives in [place] with her husband. Has 2 children.    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    Walks her dog approx. 30 min/day. Has no specific diet     Family History  Problem Relation Age of Onset  . Cancer Father   . Other  Mother     MVA    ROS- All systems are reviewed and negative except as per the HPI above  Physical Exam: Filed Vitals:   07/15/15 1130  BP: 178/106  Pulse: 56  Height: 5\' 10"  (1.778 m)  Weight: 199 lb 3.2 oz (90.357 kg)    GEN- The patient is well appearing, alert and oriented x 3 today.   Head- normocephalic, atraumatic Eyes-  Sclera clear, conjunctiva pink Ears- hearing intact Oropharynx- clear Neck- supple, no JVP Lymph- no cervical lymphadenopathy Lungs- Clear to ausculation bilaterally, normal work of breathing Heart- Regular rate and rhythm, no murmurs, rubs or gallops, PMI not laterally displaced GI- soft, NT, ND, + BS Extremities- no clubbing, cyanosis, or edema MS- no significant deformity or atrophy Skin- no rash or lesion Psych- euthymic mood, full affect Neuro- strength and sensation are intact  EKG- Sinus brady at 56 bpm, Pr int 162 bpm, QRS int 80 ms, QTc 443 ms.  Assessment and Plan: 1.Afib Doing well s/p ablation Continue xarelto/flecainide for now  2. HTN Elevated today Avoid salt Check BP later today and if remains elevated consult PCP.  F/u with Dr. Johney Frame as scheduled 11/21 Afib clinic as needed  Lupita Leash C. [facility] [address] [place], [place] [address] [phone]

## 2015-07-18 ENCOUNTER — Telehealth: Payer: Self-pay | Admitting: Cardiology

## 2015-07-18 NOTE — Telephone Encounter (Signed)
New Message  Pt calling to speak w/ RN concerning FMLA papers/ Please call back and discuss.

## 2015-07-18 NOTE — Telephone Encounter (Signed)
Reviewed with medical records and pt was notified on 07/08/15 to pick up paperwork at [facility] office.  I placed call to pt and left message to call back.

## 2015-07-21 NOTE — Telephone Encounter (Signed)
Spoke with pt about FMLA forms.  She is upset they were "filled out wrong"  She states they should say she should be allowed to be out of work more and they only say once yearly per Dr Anne Fu.  Advised pt according to Dr Anne Fu she should only need a once a year follow up since she has had a At Fib Ablation.  Pt is stating she is having recurrent episodes of At Fib since her ablation.  Advised her to f/u with At Fib clinic or Dr Johney Frame.  Phone call was disconnected.

## 2015-07-21 NOTE — Telephone Encounter (Signed)
Follow up    Pt calling back regarding earlier conversation; phone call was dropped

## 2015-09-22 ENCOUNTER — Ambulatory Visit (INDEPENDENT_AMBULATORY_CARE_PROVIDER_SITE_OTHER): Payer: Federal, State, Local not specified - PPO | Admitting: Internal Medicine

## 2015-09-22 ENCOUNTER — Encounter: Payer: Self-pay | Admitting: Internal Medicine

## 2015-09-22 VITALS — BP 152/84 | HR 64 | Ht 69.0 in | Wt 190.0 lb

## 2015-09-22 DIAGNOSIS — I481 Persistent atrial fibrillation: Secondary | ICD-10-CM | POA: Diagnosis not present

## 2015-09-22 DIAGNOSIS — I10 Essential (primary) hypertension: Secondary | ICD-10-CM | POA: Diagnosis not present

## 2015-09-22 DIAGNOSIS — I4819 Other persistent atrial fibrillation: Secondary | ICD-10-CM

## 2015-09-22 MED ORDER — AMLODIPINE BESYLATE 2.5 MG PO TABS: 2.5000 mg | ORAL_TABLET | Freq: Every day | ORAL | Status: DC

## 2015-09-22 NOTE — Patient Instructions (Signed)
Medication Instructions:  Your physician has recommended you make the following change in your medication:  1) stop Flecainide 2) Start Amlodipine 2.5 mg daily   Labwork: None ordered   Testing/Procedures: None ordered   Follow-Up: Your physician recommends that you schedule a follow-up appointment in: 3 months with Dr Johney FrameAllred   Any Other Special Instructions Will Be Listed Below (If Applicable).     If you need a refill on your cardiac medications before your next appointment, please call your pharmacy.

## 2015-09-22 NOTE — Progress Notes (Signed)
Electrophysiology Office Note   Date:  09/22/2015   ID:  Jill Escobar, DOB Dec 02, 1955, MRN [id number]  PCP:  Georgianne FickAMACHANDRAN,AJITH, MD  Cardiologist:  Dr Anne FuSkains Primary Electrophysiologist: Hillis RangeJames Colton Tassin, MD    Chief Complaint  Patient presents with  . Persistent AFIB     History of Present Illness: Jill Escobar is a 59 y.o. female who presents today for electrophysiology evaluation.  She has done well since her ablation.  She denies procedure related complications.  She had palpitations early post ablation but feels that these have now resolved.  BP remains elevated.  Today, she denies symptoms of palpitations, chest pain, shortness of breath, orthopnea, PND, lower extremity edema, claudication, dizziness, presyncope, syncope, bleeding, or neurologic sequela. The patient is tolerating medications without difficulties and is otherwise without complaint today.    Past Medical History  Diagnosis Date  . Osteoarthritis     L hip  . HTN (hypertension)   . Atrial fibrillation (HCC)   . Hypokalemia   . Overweight    Past Surgical History  Procedure Laterality Date  . Vesicovaginal fistula closure w/ tah    . D&c and removal of endonetrial polyp    . Laparoscopy      with lysis of pelvic adhesions   . Drainage of cyst      follicular functional-type of cysyt- L ovary   . Uterosacral nerve ablation      with bipolar coagulation. Surgeon Dr. Jennette KettleNeal  . Nasal endoscopy      upper  . Tee without cardioversion N/A 04/14/2015    Procedure: TRANSESOPHAGEAL ECHOCARDIOGRAM (TEE);  Surgeon: Jake BatheMark C Skains, MD;  [facility];  Service: Cardiovascular;  Laterality: N/A;  . Joint replacement Left 2012    hip  . Electrophysiologic study N/A 06/17/2015    Procedure: Atrial Fibrillation Ablation;  Surgeon: Hillis RangeJames Louden Houseworth, MD;  [facility];  Service: Cardiovascular;  Laterality: N/A;  . Tee without cardioversion N/A 06/17/2015    Procedure: TRANSESOPHAGEAL  ECHOCARDIOGRAM (TEE);  Surgeon: Chilton Siiffany [place], MD;  [facility];  Service: Cardiovascular;  Laterality: N/A;     Current Outpatient Prescriptions  Medication Sig Dispense Refill  . acetaminophen (TYLENOL) 500 MG tablet Take 1,000 mg by mouth at bedtime as needed (pain).    Marland Kitchen. diltiazem (TAZTIA XT) 360 MG 24 hr capsule TAKE ONE CAPSULE BY MOUTH EVERY DAY (Patient taking differently: Take 360 mg by mouth daily. ) 90 capsule 3  . flecainide (TAMBOCOR) 100 MG tablet Take 1 tablet (100 mg total) by mouth 2 (two) times daily. 60 tablet 11  . Multiple Vitamin (MULTIVITAMIN WITH MINERALS) TABS tablet Take 1 tablet by mouth daily.    . rivaroxaban (XARELTO) 20 MG TABS tablet Take 1 tablet (20 mg total) by mouth daily with supper. 30 tablet 11  . telmisartan-hydrochlorothiazide (MICARDIS HCT) 80-25 MG per tablet Take 1 tablet by mouth daily. 90 tablet 1   Current Facility-Administered Medications  Medication Dose Route Frequency Provider Last Rate Last Dose  . cloNIDine (CATAPRES) tablet 0.1 mg  0.1 mg Oral Once Rosalio MacadamiaLori C Gerhardt, NP        Allergies:   Shellfish allergy   Social History:  The patient  reports that she has quit smoking. She does not have any smokeless tobacco history on file. She reports that she drinks alcohol. She reports that she does not use illicit drugs.   Family History:  The patient's  family history includes Cancer in her father; Other  in her mother.    ROS:  Please see the history of present illness.   All other systems are reviewed and negative.    PHYSICAL EXAM: VS:  BP 152/84 mmHg  Pulse 64  Ht  (1.753 m)  Wt 190 lb (86.183 kg)  BMI 28.05 kg/m2 , BMI Body mass index is 28.05 kg/(m^2). GEN: Well nourished, well developed, in no acute distress HEENT: normal Neck: no JVD, carotid bruits, or masses Cardiac: RRR; no murmurs, rubs, or gallops,no edema  Respiratory:  clear to auscultation bilaterally, normal work of breathing GI: soft, nontender,  nondistended, + BS MS: no deformity or atrophy Skin: warm and dry  Neuro:  Strength and sensation are intact Psych: euthymic mood, full affect  EKG:  EKG is ordered today. The ekg ordered today shows sinus rhythm 64 bpm, PR 148, QRS 78   Recent Labs: 06/10/2015: Hemoglobin 12.6; Platelets 221.0 06/18/2015: BUN 13; Creatinine, Ser 0.94; Potassium 3.9; Sodium 134*    Lipid Panel     Component Value Date/Time   CHOL * 05/12/2010 0347    206        ATP III CLASSIFICATION:  <200     mg/dL   Desirable  098-119  mg/dL   Borderline High  >=147    mg/dL   High          TRIG 40 05/12/2010 0347   HDL 96 05/12/2010 0347   CHOLHDL 2.1 05/12/2010 0347   VLDL 8 05/12/2010 0347   LDLCALC * 05/12/2010 0347    102        Total Cholesterol/HDL:CHD Risk Coronary Heart Disease Risk Table                     Men   Women  1/2 Average Risk   3.4   3.3  Average Risk       5.0   4.4  2 X Average Risk   9.6   7.1  3 X Average Risk  23.4   11.0        Use the calculated Patient Ratio above and the CHD Risk Table to determine the patient's CHD Risk.        ATP III CLASSIFICATION (LDL):  <100     mg/dL   Optimal  829-562  mg/dL   Near or Above                    Optimal  130-159  mg/dL   Borderline  130-865  mg/dL   High  >784     mg/dL   Very High     Wt Readings from Last 3 Encounters:  09/22/15 190 lb (86.183 kg)  07/15/15 199 lb 3.2 oz (90.357 kg)  07/02/15 196 lb 6.4 oz (89.086 kg)     ASSESSMENT AND PLAN:  1.  Paroxysmal atrial fibrillation Doing well s/p ablation Stop flecainide Hope to reduce diltiazem upon return chads2vasc score is at least 2.  Continue on anticoagulation  2. htn Elevated Add amlodipine 2.5mg  daily.  This can be uptittrated as we reduce diltiazem going forward  Current medicines are reviewed at length with the patient today.   The patient does not have concerns regarding her medicines.  The following changes were made today:  none  Labs/ tests  ordered today include:  No orders of the defined types were placed in this encounter.     SignedHillis Range, MD  09/22/2015 12:09 PM  [place] [address] [phone] (office) [phone] (fax)

## 2015-12-24 ENCOUNTER — Ambulatory Visit (INDEPENDENT_AMBULATORY_CARE_PROVIDER_SITE_OTHER): Payer: Federal, State, Local not specified - PPO | Admitting: Internal Medicine

## 2015-12-24 ENCOUNTER — Other Ambulatory Visit: Payer: Self-pay

## 2015-12-24 VITALS — BP 162/90 | HR 64 | Ht 69.0 in | Wt 197.2 lb

## 2015-12-24 DIAGNOSIS — I481 Persistent atrial fibrillation: Secondary | ICD-10-CM

## 2015-12-24 DIAGNOSIS — I4819 Other persistent atrial fibrillation: Secondary | ICD-10-CM

## 2015-12-24 DIAGNOSIS — I10 Essential (primary) hypertension: Secondary | ICD-10-CM

## 2015-12-24 MED ORDER — AMLODIPINE BESYLATE 5 MG PO TABS: 5.0000 mg | ORAL_TABLET | Freq: Every day | ORAL | Status: DC

## 2015-12-24 MED ORDER — FLECAINIDE ACETATE 50 MG PO TABS: 50.0000 mg | ORAL_TABLET | Freq: Two times a day (BID) | ORAL | Status: DC

## 2015-12-24 NOTE — Progress Notes (Signed)
Electrophysiology Office Note   Date:  12/24/2015   ID:  Jill Escobar, DOB Mar 28, 1956, MRN [id number]  PCP:  Georgianne Fick, MD  Cardiologist:  Dr Anne Fu Primary Electrophysiologist: Hillis Range, MD    Chief Complaint  Patient presents with  . Atrial Fibrillation     History of Present Illness: Jill Escobar is a 60 y.o. female who presents today for electrophysiology evaluation.  She was doing very well since I saw her last.  Unfortunately 3 weeks ago, she began prednisone for neck pain.  She has had daily palpitations since that time and feels that this is due to afib.  She reports having an episode this am.  She stopped prednisone a week ago.  Today, she denies symptoms of palpitations, chest pain, shortness of breath, orthopnea, PND, lower extremity edema, claudication, dizziness, presyncope, syncope, bleeding, or neurologic sequela. The patient is tolerating medications without difficulties and is otherwise without complaint today.    Past Medical History  Diagnosis Date  . Osteoarthritis     L hip  . HTN (hypertension)   . Atrial fibrillation (HCC)   . Hypokalemia   . Overweight    Past Surgical History  Procedure Laterality Date  . Vesicovaginal fistula closure w/ tah    . D&c and removal of endonetrial polyp    . Laparoscopy      with lysis of pelvic adhesions   . Drainage of cyst      follicular functional-type of cysyt- L ovary   . Uterosacral nerve ablation      with bipolar coagulation. Surgeon Dr. Jennette Kettle  . Nasal endoscopy      upper  . Tee without cardioversion N/A 04/14/2015    Procedure: TRANSESOPHAGEAL ECHOCARDIOGRAM (TEE);  Surgeon: Jake Bathe, MD;  Location: [facility] ENDOSCOPY;  Service: Cardiovascular;  Laterality: N/A;  . Joint replacement Left 2012    hip  . Electrophysiologic study N/A 06/17/2015    Procedure: Atrial Fibrillation Ablation;  Surgeon: Hillis Range, MD;  Location: [facility] INVASIVE CV LAB;  Service: Cardiovascular;  Laterality: N/A;   . Tee without cardioversion N/A 06/17/2015    Procedure: TRANSESOPHAGEAL ECHOCARDIOGRAM (TEE);  Surgeon: Chilton Si, MD;  Location: [facility] ENDOSCOPY;  Service: Cardiovascular;  Laterality: N/A;     Current Outpatient Prescriptions  Medication Sig Dispense Refill  . acetaminophen (TYLENOL) 500 MG tablet Take 1,000 mg by mouth at bedtime as needed (pain).    Marland Kitchen amLODipine (NORVASC) 2.5 MG tablet Take 1 tablet (2.5 mg total) by mouth daily. 90 tablet 3  . diltiazem (TAZTIA XT) 360 MG 24 hr capsule TAKE ONE CAPSULE BY MOUTH EVERY DAY (Patient taking differently: Take 360 mg by mouth daily. ) 90 capsule 3  . Multiple Vitamin (MULTIVITAMIN WITH MINERALS) TABS tablet Take 1 tablet by mouth daily.    . rivaroxaban (XARELTO) 20 MG TABS tablet Take 1 tablet (20 mg total) by mouth daily with supper. 30 tablet 11  . telmisartan-hydrochlorothiazide (MICARDIS HCT) 80-25 MG per tablet Take 1 tablet by mouth daily. 90 tablet 1  . traMADol (ULTRAM) 50 MG tablet Take 1 tablet by mouth as directed.     Current Facility-Administered Medications  Medication Dose Route Frequency Provider Last Rate Last Dose  . cloNIDine (CATAPRES) tablet 0.1 mg  0.1 mg Oral Once Rosalio Macadamia, NP        Allergies:   Shellfish allergy   Social History:  The patient  reports that she has quit smoking. She does not have any smokeless  tobacco history on file. She reports that she drinks alcohol. She reports that she does not use illicit drugs.   Family History:  The patient's  family history includes Cancer in her father; Other in her mother.    ROS:  Please see the history of present illness.   All other systems are reviewed and negative.    PHYSICAL EXAM: VS:  BP 162/90 mmHg  Pulse 64  Ht  (1.753 m)  Wt 197 lb 3.2 oz (89.449 kg)  BMI 29.11 kg/m2 , BMI Body mass index is 29.11 kg/(m^2). GEN: Well nourished, well developed, in no acute distress HEENT: normal Neck: no JVD, carotid bruits, or masses Cardiac: RRR;  no murmurs, rubs, or gallops,no edema  Respiratory:  clear to auscultation bilaterally, normal work of breathing GI: soft, nontender, nondistended, + BS MS: no deformity or atrophy Skin: warm and dry  Neuro:  Strength and sensation are intact Psych: euthymic mood, full affect  EKG:  EKG is ordered today. The ekg ordered today shows sinus rhythm 64 bpm, PR 140, QRS 80   Recent Labs: 06/10/2015: Hemoglobin 12.6; Platelets 221.0 06/18/2015: BUN 13; Creatinine, Ser 0.94; Potassium 3.9; Sodium 134*    Lipid Panel     Component Value Date/Time   CHOL * 05/12/2010 0347    206        ATP III CLASSIFICATION:  <200     mg/dL   Desirable  161-096  mg/dL   Borderline High  >=045    mg/dL   High          TRIG 40 05/12/2010 0347   HDL 96 05/12/2010 0347   CHOLHDL 2.1 05/12/2010 0347   VLDL 8 05/12/2010 0347   LDLCALC * 05/12/2010 0347    102        Total Cholesterol/HDL:CHD Risk Coronary Heart Disease Risk Table                     Men   Women  1/2 Average Risk   3.4   3.3  Average Risk       5.0   4.4  2 X Average Risk   9.6   7.1  3 X Average Risk  23.4   11.0        Use the calculated Patient Ratio above and the CHD Risk Table to determine the patient's CHD Risk.        ATP III CLASSIFICATION (LDL):  <100     mg/dL   Optimal  409-811  mg/dL   Near or Above                    Optimal  130-159  mg/dL   Borderline  914-782  mg/dL   High  >956     mg/dL   Very High     Wt Readings from Last 3 Encounters:  12/24/15 197 lb 3.2 oz (89.449 kg)  09/22/15 190 lb (86.183 kg)  07/15/15 199 lb 3.2 oz (90.357 kg)     ASSESSMENT AND PLAN:  1.  Paroxysmal atrial fibrillation Recent episodes of afib due to prednisone Avoidance of prednisone encouraged Restart flecainide  BID (this can be further increased to  BID) chads2vasc score is at least 2.  Continue on anticoagulation She will see Lupita Leash in 4 weeks.  If afib resolves, can try to stop flecainide at that time  2.  htn Elevated Increase amlodipine to  daily.  This can be uptittrated as we reduce  diltiazem going forward  Current medicines are reviewed at length with the patient today.   The patient does not have concerns regarding her medicines.  The following changes were made today:  none   Signed, Hillis Range, MD  12/24/2015 3:37 PM     [facility] [address] [place] [address] [phone] (office) [phone] (fax)

## 2015-12-24 NOTE — Patient Instructions (Addendum)
Medication Instructions:  Your physician has recommended you make the following change in your medication:  1) Increase Amlodipine to 5 mg daily 2) Start Flecainide 50 mg twice daily   Labwork: None ordered   Testing/Procedures: None ordered   Follow-Up: Your physician wants you to follow-up in: 4 weeks with Rudi Coco, NP   Any Other Special Instructions Will Be Listed Below (If Applicable).     If you need a refill on your cardiac medications before your next appointment, please call your pharmacy.

## 2016-01-13 ENCOUNTER — Encounter (HOSPITAL_COMMUNITY): Payer: Self-pay | Admitting: Nurse Practitioner

## 2016-01-13 ENCOUNTER — Ambulatory Visit (HOSPITAL_COMMUNITY)
Admission: RE | Admit: 2016-01-13 | Discharge: 2016-01-13 | Disposition: A | Discharge status: Home / Self Care | Payer: Federal, State, Local not specified - PPO | Source: Ambulatory Visit | Attending: Nurse Practitioner | Admitting: Nurse Practitioner

## 2016-01-13 VITALS — BP 128/76 | HR 68 | Ht 69.0 in | Wt 195.8 lb

## 2016-01-13 DIAGNOSIS — Z6828 Body mass index (BMI) 28.0-28.9, adult: Secondary | ICD-10-CM | POA: Diagnosis not present

## 2016-01-13 DIAGNOSIS — E663 Overweight: Secondary | ICD-10-CM | POA: Diagnosis not present

## 2016-01-13 DIAGNOSIS — Z7901 Long term (current) use of anticoagulants: Secondary | ICD-10-CM | POA: Diagnosis not present

## 2016-01-13 DIAGNOSIS — I10 Essential (primary) hypertension: Secondary | ICD-10-CM | POA: Diagnosis not present

## 2016-01-13 DIAGNOSIS — Z87891 Personal history of nicotine dependence: Secondary | ICD-10-CM | POA: Insufficient documentation

## 2016-01-13 DIAGNOSIS — I480 Paroxysmal atrial fibrillation: Secondary | ICD-10-CM

## 2016-01-13 DIAGNOSIS — M1612 Unilateral primary osteoarthritis, left hip: Secondary | ICD-10-CM | POA: Diagnosis not present

## 2016-01-13 DIAGNOSIS — Z79899 Other long term (current) drug therapy: Secondary | ICD-10-CM | POA: Diagnosis not present

## 2016-01-13 DIAGNOSIS — I4891 Unspecified atrial fibrillation: Secondary | ICD-10-CM | POA: Insufficient documentation

## 2016-01-13 NOTE — Progress Notes (Signed)
Patient ID: Jill Escobar, female   DOB: 23-Aug-1956, 60 y.o.   MRN: [id number]     Primary Care Physician: Georgianne FickAMACHANDRAN,AJITH, MD Referring Physician: Dr. Fredrik CoveAllred   Jill Escobar is a 60 y.o. female with a h/o PAF that saw Dr. Johney FrameAllred recently and was c/o of daily palpitations that was brought on by Prednisone, thought to represent afib. She was sent in a rx for flecainide but never received her drug. However, the prednisone did get out of her system and the palpitations did resolve. She was also started on amlodipine for her BP which is well controlled and she is tolerating without LLE swelling.   Today, she denies symptoms of palpitations, chest pain, shortness of breath, orthopnea, PND, lower extremity edema, dizziness, presyncope, syncope, or neurologic sequela. The patient is tolerating medications without difficulties and is otherwise without complaint today.   Past Medical History  Diagnosis Date  . Osteoarthritis     L hip  . HTN (hypertension)   . Atrial fibrillation (HCC)   . Hypokalemia   . Overweight    Past Surgical History  Procedure Laterality Date  . Vesicovaginal fistula closure w/ tah    . D&c and removal of endonetrial polyp    . Laparoscopy      with lysis of pelvic adhesions   . Drainage of cyst      follicular functional-type of cysyt- L ovary   . Uterosacral nerve ablation      with bipolar coagulation. Surgeon Dr. Jennette KettleNeal  . Nasal endoscopy      upper  . Tee without cardioversion Jill Escobar 04/14/2015    Procedure: TRANSESOPHAGEAL ECHOCARDIOGRAM (TEE);  Surgeon: Jake BatheMark C Skains, MD;  [facility];  Service: Cardiovascular;  Laterality: Jill Escobar;  . Joint replacement Left 2012    hip  . Electrophysiologic study Jill Escobar 06/17/2015    Procedure: Atrial Fibrillation Ablation;  Surgeon: Hillis RangeJames Allred, MD;  [facility];  Service: Cardiovascular;  Laterality: Jill Escobar;  . Tee without cardioversion Jill Escobar 06/17/2015    Procedure: TRANSESOPHAGEAL ECHOCARDIOGRAM  (TEE);  Surgeon: Chilton Siiffany , MD;  [facility];  Service: Cardiovascular;  Laterality: Jill Escobar;    Current Outpatient Prescriptions  Medication Sig Dispense Refill  . acetaminophen (TYLENOL) 500 MG tablet Take 1,000 mg by mouth at bedtime as needed (pain).    Marland Kitchen. amLODipine (NORVASC) 5 MG tablet Take 1 tablet (5 mg total) by mouth daily. 30 tablet 11  . diltiazem (TAZTIA XT) 360 MG 24 hr capsule TAKE ONE CAPSULE BY MOUTH EVERY DAY (Patient taking differently: Take 360 mg by mouth daily. ) 90 capsule 3  . Multiple Vitamin (MULTIVITAMIN WITH MINERALS) TABS tablet Take 1 tablet by mouth daily.    . rivaroxaban (XARELTO) 20 MG TABS tablet Take 1 tablet (20 mg total) by mouth daily with supper. 30 tablet 11  . telmisartan-hydrochlorothiazide (MICARDIS HCT) 80-25 MG per tablet Take 1 tablet by mouth daily. 90 tablet 1  . traMADol (ULTRAM) 50 MG tablet Take 1 tablet by mouth as directed.     Current Facility-Administered Medications  Medication Dose Route Frequency Provider Last Rate Last Dose  . cloNIDine (CATAPRES) tablet 0.1 mg  0.1 mg Oral Once Rosalio MacadamiaLori C Gerhardt, NP        Allergies  Allergen Reactions  . Shellfish Allergy Hives    Social History   Social History  . Marital Status: Married    Spouse Name: Jill Escobar  . Number of Children: Jill Escobar  . Years of Education: Jill Escobar  Occupational History  . Not on file.   Social History Main Topics  . Smoking status: Former Games developer  . Smokeless tobacco: Not on file     Comment: has a 5 pack year hx. quit 4 years ago.   . Alcohol Use: Yes     Comment: occasional   . Drug Use: No  . Sexual Activity: Not Currently   Other Topics Concern  . Not on file   Social History Narrative   Lives in [place] with her husband. Has 2 children.    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    Walks her dog approx. 30 min/day. Has no specific diet     Family History  Problem Relation Age of Onset  . Cancer Father   . Other Mother     MVA    ROS- All systems are  reviewed and negative except as per the HPI above  Physical Exam: Filed Vitals:   01/13/16 1405  BP: 128/76  Pulse: 68  Height:  (1.753 m)  Weight: 195 lb 12.8 oz (88.814 kg)    GEN- The patient is well appearing, alert and oriented x 3 today.   Head- normocephalic, atraumatic Eyes-  Sclera clear, conjunctiva pink Ears- hearing intact Oropharynx- clear Neck- supple, no JVP Lymph- no cervical lymphadenopathy Lungs- Clear to ausculation bilaterally, normal work of breathing Heart- Regular rate and rhythm, no murmurs, rubs or gallops, PMI not laterally displaced GI- soft, NT, ND, + BS Extremities- no clubbing, cyanosis, or edema MS- no significant deformity or atrophy Skin- no rash or lesion Psych- euthymic mood, full affect Neuro- strength and sensation are intact  EKG- SR with a PNC  WITH  PR INT  148 MS, QRS INT 80 MS, QTC 442 MS Epic records reviewed  Assessment and Plan: 1. afib Staying in SR, never started flecainide because palpitations resolved off prednisone Continue on diltiazem Continue xarelto  2. HTN Stable   F/u as scheduled with Dr. Johney Frame afib clinic as needed  [facility]. [facility] [address] [place], [place] [address] [phone]  Palpitations resovled

## 2016-02-21 ENCOUNTER — Other Ambulatory Visit: Payer: Self-pay | Admitting: Cardiology

## 2016-06-08 ENCOUNTER — Other Ambulatory Visit: Payer: Self-pay | Admitting: Cardiology

## 2016-06-08 DIAGNOSIS — I10 Essential (primary) hypertension: Secondary | ICD-10-CM

## 2016-06-08 DIAGNOSIS — I480 Paroxysmal atrial fibrillation: Secondary | ICD-10-CM

## 2016-06-24 ENCOUNTER — Other Ambulatory Visit: Payer: Self-pay | Admitting: Cardiology

## 2016-07-06 ENCOUNTER — Encounter: Payer: Self-pay | Admitting: Physician Assistant

## 2016-07-06 NOTE — Progress Notes (Signed)
Cardiology Office Note Date:  07/07/2016  Patient ID:  Jill Escobar, DOB 1956/03/15, MRN [id number] PCP:  Georgianne FickAMACHANDRAN,AJITH, MD  Cardiologist:  Dr. Anne FuSkains Electrophysiologist: Dr. Johney FrameAllred   Chief Complaint: planned routine visit  History of Present Illness: Jill Escobar is a 60 y.o. female with history of PAfib, HTN, OA, comes in today to be seen for Dr. Johney FrameAllred, last seen by him in February, at that time had been treated with steroid for neck pain and felt like she had been having Afib, she was started on Flecainide, she saw AF clinic in f/u, noted that she had never started the Flecainide and was in SR, given stroid tx was cxompleted, continued on dilt alone.  She is feeling well, denies any kind of CP, she will occasionally feel skipped/extra beats, no persistent palpitations or SOB, no dizziness, near syncope or syncope.  She works on her feet and for years has worn compression stockings, and for over a year or so noted towards the end of the day LE swelling, in the last 6 months or so, this has gotten worse, some evening uncomfortably so.  No change in her exertional capacity, no symptoms of PND or orthopnea   AFib history: Feb 2017, Flecainide prescibed never started, palpitations resolved off steroid treatment  Past Medical History:  Diagnosis Date  . Atrial fibrillation (HCC)   . HTN (hypertension)   . Hypokalemia   . Osteoarthritis    L hip  . Overweight     Past Surgical History:  Procedure Laterality Date  . D&C and removal of endonetrial polyp    . drainage of cyst     follicular functional-type of cysyt- L ovary   . ELECTROPHYSIOLOGIC STUDY N/A 06/17/2015   Procedure: Atrial Fibrillation Ablation;  Surgeon: Hillis RangeJames Allred, MD;  [facility];  Service: Cardiovascular;  Laterality: N/A;  . JOINT REPLACEMENT Left 2012   hip  . LAPAROSCOPY     with lysis of pelvic adhesions   . NASAL ENDOSCOPY     upper  . TEE WITHOUT CARDIOVERSION N/A 04/14/2015     Procedure: TRANSESOPHAGEAL ECHOCARDIOGRAM (TEE);  Surgeon: Jake BatheMark C Skains, MD;  [facility];  Service: Cardiovascular;  Laterality: N/A;  . TEE WITHOUT CARDIOVERSION N/A 06/17/2015   Procedure: TRANSESOPHAGEAL ECHOCARDIOGRAM (TEE);  Surgeon: Chilton Siiffany Neskowin, MD;  [facility];  Service: Cardiovascular;  Laterality: N/A;  . uterosacral nerve ablation     with bipolar coagulation. Surgeon Dr. Jennette KettleNeal  . VESICOVAGINAL FISTULA CLOSURE W/ TAH      Current Outpatient Prescriptions  Medication Sig Dispense Refill  . acetaminophen (TYLENOL) 500 MG tablet Take 1,000 mg by mouth at bedtime as needed (pain).    Marland Kitchen. amLODipine (NORVASC) 5 MG tablet Take 1 tablet (5 mg total) by mouth daily. 30 tablet 11  . diltiazem (TAZTIA XT) 360 MG 24 hr capsule Take 1 capsule (360 mg total) by mouth daily. Please call and schedule an appointment with Dr Anne FuSkains 30 capsule 0  . Multiple Vitamin (MULTIVITAMIN WITH MINERALS) TABS tablet Take 1 tablet by mouth daily.    Marland Kitchen. telmisartan-hydrochlorothiazide (MICARDIS HCT) 80-25 MG per tablet Take 1 tablet by mouth daily. 90 tablet 1  . traMADol (ULTRAM) 50 MG tablet Take 1 tablet by mouth as directed.    Carlena Hurl. XARELTO 20 MG TABS tablet TAKE ONE TABLET BY MOUTH ONCE DAILY WITH SUPPER 30 tablet 0   Current Facility-Administered Medications  Medication Dose Route Frequency Provider Last Rate Last Dose  .  cloNIDine (CATAPRES) tablet 0.1 mg  0.1 mg Oral Once Rosalio Macadamia, NP        Allergies:   Shellfish allergy   Social History:  The patient  reports that she has quit smoking. She has never used smokeless tobacco. She reports that she drinks alcohol. She reports that she does not use drugs.   Family History:  The patient's family history includes Cancer in her father; Other in her mother.  ROS:  Please see the history of present illness.  All other systems are reviewed and otherwise negative.   PHYSICAL EXAM:  VS:  BP (!) 144/80   Pulse 65   Ht 5\' 9"   (1.753 m)   Wt 204 lb (92.5 kg)   BMI 30.13 kg/m  BMI: Body mass index is 30.13 kg/m. Well nourished, well developed, in no acute distress  HEENT: normocephalic, atraumatic  Neck: no JVD, carotid bruits or masses Cardiac:  RRR; 1/6SM, no rubs, or gallops Lungs:  clear to auscultation bilaterally, no wheezing, rhonchi or rales  Abd: soft, nontender MS: no deformity or atrophy Ext: 1++ b/l LE edema  Skin: warm and dry, no rash Neuro:  No gross deficits appreciated Psych: euthymic mood, full affect    EKG:  Done 01/13/15 was SR, 68bpm  06/17/15 TEE Study Conclusions - Left ventricle: Systolic function was normal. The estimated   ejection fraction was in the range of 55% to 65%. - Aortic valve: No evidence of vegetation. - Left atrium: No evidence of thrombus in the atrial cavity or   appendage. No evidence of thrombus in the atrial cavity or   appendage. - Atrial septum: No defect or patent foramen ovale was identified.   Echo contrast study showed no right-to-left atrial level shunt,   following an increase in RA pressure induced by provocative   maneuvers. - Pulmonic valve: No evidence of vegetation.  02/18/14: TTE Study Conclusions - Left ventricle: The cavity size was normal. There was mild concentric hypertrophy. Systolic function was vigorous. The estimated ejection fraction was in the range of 65% to 70%. Wall motion was normal; there were no regional wall motion abnormalities. The study was not technically sufficient to allow evaluation of LV diastolic dysfunction due to atrial fibrillation. - Aortic valve: Trileaflet; normal thickness leaflets. No regurgitation. - Mitral valve: Mild regurgitation. - Left atrium: There is highly echogenic structure attached to the inferior left atrial wall measuring 20 x 9 mm. It is unchanged from the prior echocardiogram in 2011. The atrium was mildly dilated. - Right ventricle: Systolic function was  normal. - Right atrium: The atrium was normal in size. - Tricuspid valve: Mild regurgitation. - Pulmonary arteries: Systolic pressure was within the normal range. - Inferior vena cava: The vessel was normal in size.   Recent Labs: No results found for requested labs within last 8760 hours.  No results found for requested labs within last 8760 hours.   CrCl cannot be calculated (Patient's most recent lab result is older than the maximum 21 days allowed.).   Wt Readings from Last 3 Encounters:  07/07/16 204 lb (92.5 kg)  01/13/16 195 lb 12.8 oz (88.8 kg)  12/24/15 197 lb 3.2 oz (89.4 kg)     Other studies reviewed Additional studies/records reviewed today include: summarized above  ASSESSMENT AND PLAN:  1. PAFib     CHA2DS2Vasc is at least 2, on xarelto  2. HTN     Stable  Edema likely secondary to her meds, discussed stopping Norvasc and  switching to another agent, though she mentions she doesn't "tend to do well" with new medicines and was hesitant to change, we will try decreasing her Norvasc to 2.5mg  daily, she takes in a fair amount of sodium, admits that she tends to salt her food, discussed minimizing her sodium intake as well, keeping her feet elevated when at rest.  She is asked to monitor her BP at home on the lower dose and notify if she notes it start to increase or get readings >150/90, drew for CBC and BMET today   Disposition: F/u in 3months, sooner if needed should her edema not improve or BP become uncontrolled  Current medicines are reviewed at length with the patient today.  The patient did not have any concerns regarding medicines.  Judith Blonder, PA-C 07/07/2016 11:49 AM     [facility] [address] [place] [address] [phone] (office)  [phone] (fax)

## 2016-07-07 ENCOUNTER — Encounter (INDEPENDENT_AMBULATORY_CARE_PROVIDER_SITE_OTHER): Payer: Self-pay

## 2016-07-07 ENCOUNTER — Ambulatory Visit (INDEPENDENT_AMBULATORY_CARE_PROVIDER_SITE_OTHER): Payer: Federal, State, Local not specified - PPO | Admitting: Physician Assistant

## 2016-07-07 VITALS — BP 144/80 | HR 65 | Ht 69.0 in | Wt 204.0 lb

## 2016-07-07 DIAGNOSIS — I10 Essential (primary) hypertension: Secondary | ICD-10-CM

## 2016-07-07 DIAGNOSIS — Z79899 Other long term (current) drug therapy: Secondary | ICD-10-CM

## 2016-07-07 DIAGNOSIS — I480 Paroxysmal atrial fibrillation: Secondary | ICD-10-CM | POA: Diagnosis not present

## 2016-07-07 LAB — BASIC METABOLIC PANEL
BUN: 20 mg/dL (ref 7–25)
CHLORIDE: 101 mmol/L (ref 98–110)
CO2: 24 mmol/L (ref 20–31)
CREATININE: 1.17 mg/dL — AB (ref 0.50–1.05)
Calcium: 10 mg/dL (ref 8.6–10.4)
Glucose, Bld: 102 mg/dL — ABNORMAL HIGH (ref 65–99)
Potassium: 3.5 mmol/L (ref 3.5–5.3)
Sodium: 138 mmol/L (ref 135–146)

## 2016-07-07 LAB — CBC
HEMATOCRIT: 36 % (ref 35.0–45.0)
Hemoglobin: 12.4 g/dL (ref 11.7–15.5)
MCH: 32.4 pg (ref 27.0–33.0)
MCHC: 34.4 g/dL (ref 32.0–36.0)
MCV: 94 fL (ref 80.0–100.0)
MPV: 10.1 fL (ref 7.5–12.5)
PLATELETS: 241 10*3/uL (ref 140–400)
RBC: 3.83 MIL/uL (ref 3.80–5.10)
RDW: 13.2 % (ref 11.0–15.0)
WBC: 5.7 10*3/uL (ref 3.8–10.8)

## 2016-07-07 MED ORDER — DILTIAZEM HCL ER BEADS 360 MG PO CP24: 360.0000 mg | ORAL_CAPSULE | Freq: Every day | ORAL | 5 refills | Status: DC

## 2016-07-07 MED ORDER — AMLODIPINE BESYLATE 2.5 MG PO TABS: 2.5000 mg | ORAL_TABLET | Freq: Every day | ORAL | 5 refills | Status: DC

## 2016-07-07 MED ORDER — TELMISARTAN-HCTZ 80-25 MG PO TABS: 1.0000 | ORAL_TABLET | Freq: Every day | ORAL | 1 refills | Status: DC

## 2016-07-07 MED ORDER — RIVAROXABAN 20 MG PO TABS
20.0000 mg | ORAL_TABLET | Freq: Every day | ORAL | 5 refills | Status: DC
Start: 2016-07-07 — End: 2017-02-10

## 2016-07-07 NOTE — Patient Instructions (Addendum)
Medication Instructions:   START TAKING AMLODIPINE 2.5 MG ONCE A DAY   REFILLS HAS BEEN SENT IN FOR : Annamary CarolinILITAZEM XARELTO AND MICARDIS  If you need a refill on your cardiac medications before your next appointment, please call your pharmacy.  Labs:  BMET AND CBC TODAY    Testing/Procedures: NONE ORDER TODAY    Follow-Up: IN 3 TO 4 MONTHS WITH RENEE   Any Other Special Instructions Will Be Listed Below (If Applicable).

## 2016-07-08 ENCOUNTER — Telehealth: Payer: Self-pay | Admitting: *Deleted

## 2016-07-08 NOTE — Telephone Encounter (Signed)
-----   Message from Sheilah Pigeonenee Lynn Ursuy, [place]-C sent at 07/08/2016 10:32 AM EDT ----- Please let the patient know that her labs look good/stable  Thanks Renee

## 2016-07-08 NOTE — Telephone Encounter (Signed)
LMOVM TO CALL BACK FOR RESULTS 

## 2016-07-09 ENCOUNTER — Telehealth: Payer: Self-pay | Admitting: *Deleted

## 2016-07-09 ENCOUNTER — Telehealth: Payer: Self-pay | Admitting: Physician Assistant

## 2016-07-09 NOTE — Telephone Encounter (Signed)
SPOKE TO PT ABOUT RESULTS AND VERBALIZED Jill MaidensUNDERASTANDING

## 2016-07-09 NOTE — Telephone Encounter (Signed)
SPOKE TO PT ABOUT RESULTS AND VERBALIZED UNDERASTANDING 

## 2016-07-09 NOTE — Telephone Encounter (Signed)
-----   Message from Renee Lynn Ursuy, PA-C sent at 07/08/2016 10:32 AM EDT ----- Please let the patient know that her labs look good/stable  Thanks Renee 

## 2016-07-09 NOTE — Telephone Encounter (Signed)
Follow Up:; ° ° °Returning your call. °

## 2017-02-10 ENCOUNTER — Other Ambulatory Visit: Payer: Self-pay | Admitting: Physician Assistant

## 2017-02-10 DIAGNOSIS — I10 Essential (primary) hypertension: Secondary | ICD-10-CM

## 2017-02-10 DIAGNOSIS — I480 Paroxysmal atrial fibrillation: Secondary | ICD-10-CM

## 2017-02-11 ENCOUNTER — Other Ambulatory Visit: Payer: Self-pay | Admitting: *Deleted

## 2017-02-11 DIAGNOSIS — I10 Essential (primary) hypertension: Secondary | ICD-10-CM

## 2017-02-11 DIAGNOSIS — I480 Paroxysmal atrial fibrillation: Secondary | ICD-10-CM

## 2017-02-11 MED ORDER — AMLODIPINE BESYLATE 2.5 MG PO TABS: 2.5000 mg | ORAL_TABLET | Freq: Every day | ORAL | 4 refills | Status: DC

## 2017-02-11 MED ORDER — DILTIAZEM HCL ER BEADS 360 MG PO CP24: 360.0000 mg | ORAL_CAPSULE | Freq: Every day | ORAL | 4 refills | Status: DC

## 2017-03-18 ENCOUNTER — Other Ambulatory Visit: Payer: Self-pay | Admitting: Cardiology

## 2017-03-21 ENCOUNTER — Telehealth: Payer: Self-pay | Admitting: Internal Medicine

## 2017-03-21 NOTE — Telephone Encounter (Signed)
Wt 92.5kg (07/07/2016) Age 61 07/07/2016 SrCr 1.17 07/07/2016 Hgb 12.4 HCT 36.0 Saw Renee Ursuy on 07/07/2016 CrCl 75.6 Refill done for Xarelto 20 mg daily as requested

## 2017-03-21 NOTE — Telephone Encounter (Signed)
New Message     Per Pt they are stop making this medication [facility] [address]   diltiazem (TAZTIA XT) 360 MG 24 hr capsule Take 1 capsule (360 mg total) by mouth daily.   30 or 90 , prefers 90 day

## 2017-03-21 NOTE — Telephone Encounter (Signed)
Please call pharmacy to inquire.  Ok to give any diltiazem equivalent.

## 2017-03-21 NOTE — Telephone Encounter (Signed)
Called pt's pharmacy, [facility] and pharmacist stated that they can get this medication and have it ready for the pt tomorrow. I called the pt to inform her of this information. Pt verbalized understanding.

## 2017-03-25 ENCOUNTER — Telehealth: Payer: Self-pay | Admitting: Physician Assistant

## 2017-03-25 ENCOUNTER — Other Ambulatory Visit: Payer: Self-pay | Admitting: Physician Assistant

## 2017-03-25 NOTE — Telephone Encounter (Signed)
Paged by patient says her Diltiazem 24 hour capsule (Taztia XT) has run out and [facility] told her manufacture has discontinued it. I have personally called her [facility] pharmacy and was told the manufacture did not discontinue it, instead they do not have 30 capsule to give to the patient as they only have 20 capsule and waiting on back order. When I asked the [facility] pharmacy associate if patient can pick up 20 capsule for now while waiting on backorder, she says the patient "probably can".   I have relayed this message to the patient and asked her to confirm with pharmacy again before she leaves home to see if they can prescribe only 20 capsules. Also [facility] associate says they do not have diltiazem CD which is an alternative formulary.   If [facility] can not give her the 20 capsule, she will page us again and [facility]  Signed, Azalee CourseHao Kyleeann Cremeans PA Pager: [phone]

## 2017-04-19 ENCOUNTER — Other Ambulatory Visit: Payer: Self-pay | Admitting: Physician Assistant

## 2017-04-19 DIAGNOSIS — I10 Essential (primary) hypertension: Secondary | ICD-10-CM

## 2017-04-19 DIAGNOSIS — I480 Paroxysmal atrial fibrillation: Secondary | ICD-10-CM

## 2017-04-20 ENCOUNTER — Other Ambulatory Visit: Payer: Self-pay | Admitting: *Deleted

## 2017-04-20 DIAGNOSIS — I480 Paroxysmal atrial fibrillation: Secondary | ICD-10-CM

## 2017-04-20 DIAGNOSIS — I10 Essential (primary) hypertension: Secondary | ICD-10-CM

## 2017-04-20 MED ORDER — TELMISARTAN-HCTZ 80-25 MG PO TABS: 1.0000 | ORAL_TABLET | Freq: Every day | ORAL | 0 refills | Status: DC

## 2017-04-20 MED ORDER — DILTIAZEM HCL ER BEADS 360 MG PO CP24: 360.0000 mg | ORAL_CAPSULE | Freq: Every day | ORAL | 0 refills | Status: DC

## 2017-04-20 NOTE — Telephone Encounter (Signed)
Pharmacist at [facility] left a msg on the refill vm stating that diltiazem 360 mg is on backorder. They are requesting an rx for the 180 mg with a sig reflecting patients 360 mg qd dose. When I put in the order, it came up as cardizem cd but the patient has been taking the taztia xt. Is this appropriate? Please advise. Thanks, MI

## 2017-04-21 MED ORDER — DILTIAZEM HCL ER BEADS 180 MG PO CP24: 360.0000 mg | ORAL_CAPSULE | Freq: Every day | ORAL | 11 refills | Status: DC

## 2017-04-21 NOTE — Telephone Encounter (Signed)
I found the Taztia Xt 180mg  and sent in a refill for 2 capsules daily, thanks.

## 2017-04-28 DIAGNOSIS — L720 Epidermal cyst: Secondary | ICD-10-CM | POA: Diagnosis not present

## 2017-06-06 DIAGNOSIS — L720 Epidermal cyst: Secondary | ICD-10-CM | POA: Diagnosis not present

## 2017-06-12 ENCOUNTER — Encounter (HOSPITAL_BASED_OUTPATIENT_CLINIC_OR_DEPARTMENT_OTHER): Payer: Self-pay | Admitting: *Deleted

## 2017-06-12 ENCOUNTER — Emergency Department (HOSPITAL_BASED_OUTPATIENT_CLINIC_OR_DEPARTMENT_OTHER)
Admission: EM | Admit: 2017-06-12 | Discharge: 2017-06-12 | Disposition: A | Discharge status: Home / Self Care | Payer: Federal, State, Local not specified - PPO | Attending: Emergency Medicine | Admitting: Emergency Medicine

## 2017-06-12 DIAGNOSIS — Z4800 Encounter for change or removal of nonsurgical wound dressing: Secondary | ICD-10-CM | POA: Diagnosis not present

## 2017-06-12 DIAGNOSIS — Z79899 Other long term (current) drug therapy: Secondary | ICD-10-CM | POA: Insufficient documentation

## 2017-06-12 DIAGNOSIS — I10 Essential (primary) hypertension: Secondary | ICD-10-CM | POA: Diagnosis not present

## 2017-06-12 DIAGNOSIS — Z87891 Personal history of nicotine dependence: Secondary | ICD-10-CM | POA: Insufficient documentation

## 2017-06-12 DIAGNOSIS — Z7901 Long term (current) use of anticoagulants: Secondary | ICD-10-CM | POA: Diagnosis not present

## 2017-06-12 DIAGNOSIS — Z5189 Encounter for other specified aftercare: Secondary | ICD-10-CM

## 2017-06-12 NOTE — ED Triage Notes (Addendum)
Patient states she had a cyst removed from her left axilla six days ago.  States two days ago she developed bleeding from the site.  States she has woke up each morning with bleeding from the site. Patient is on blood thinners.

## 2017-06-12 NOTE — Discharge Instructions (Signed)
Please follow up with your dermatologist or primary care provider if you have any additional concerns.  you may continue to have bleeding/oozing from your wound which is normal considering you are on Xarelto.  While in the ED your blood pressure was high.  Please follow up with your primary care doctor or the wellness clinic for repeat evaluation as you may need medication.  High blood pressure can cause long term, potentially serious, damage if left untreated.

## 2017-06-12 NOTE — ED Provider Notes (Signed)
Medical screening examination/treatment/procedure(s) were conducted as a shared visit with non-physician practitioner(s) and myself.  I personally evaluated the patient during the encounter.   EKG Interpretation None     Patient had axillary cyst excision 6 days ago. Patient is chronically on Xarelto. She has had intermittent bleeding and drainage since the excision. Patient reports that the dermatologist who did the cystic physician had told her there would not be from the bleeding and so she was concerned. On examination, patient has a linear incision in the left armpit. There is a firm approximately 1.5 cm induration beneath the incision. No active bleeding at this time. Persistent intermittent bleeding consistent with the patient being anticoagulated on Xarelto. At this time no signs of severe and ongoing bleeding. No signs of secondary infection. Patient is counseled on compression and follow-up with her treating physician.   Arby BarrettePfeiffer, Maire Govan, MD 06/12/17 [phone]

## 2017-06-12 NOTE — ED Notes (Signed)
Patient refused to have her vitals signs and blood pressure rechecked,.

## 2017-06-12 NOTE — ED Provider Notes (Signed)
[facility] Provider Note   CSN: [id number] Arrival date & time: 06/12/17  1022     History   Chief Complaint Chief Complaint  Patient presents with  . Wound Check    left axilla    HPI Jill Escobar is a 61 y.o. female who Presents for evaluation of continued bleeding after. Cyst removal from left axilla approximately 6 days ago.  She reports intermittent bleeding since the procedure, reports that the dermatologist who did her cyst excision did not mention the possibility of bleeding. Patient is on chronic Xarelto.  She denies any fevers or chills, no nausea/vomiting/diarrhea.  HPI  Past Medical History:  Diagnosis Date  . Atrial fibrillation (HCC)   . HTN (hypertension)   . Hypokalemia   . Osteoarthritis    L hip  . Overweight     Patient Active Problem List   Diagnosis Date Noted  . A-fib (HCC) 06/17/2015  . Abnormal echocardiogram 04/09/2015  . HYPOKALEMIA 05/13/2010  . Essential hypertension 05/13/2010  . Atrial fibrillation (HCC) 05/13/2010  . OSTEOARTHRITIS, HIP, LEFT 05/13/2010  . History of cardiovascular disorder 05/13/2010    Past Surgical History:  Procedure Laterality Date  . CARDIAC ELECTROPHYSIOLOGY STUDY AND ABLATION    . D&C and removal of endonetrial polyp    . drainage of cyst     follicular functional-type of cysyt- L ovary   . ELECTROPHYSIOLOGIC STUDY N/A 06/17/2015   Procedure: Atrial Fibrillation Ablation;  Surgeon: Hillis Range, MD;  Location: [facility] INVASIVE CV LAB;  Service: Cardiovascular;  Laterality: N/A;  . JOINT REPLACEMENT Left 2012   hip  . LAPAROSCOPY     with lysis of pelvic adhesions   . NASAL ENDOSCOPY     upper  . TEE WITHOUT CARDIOVERSION N/A 04/14/2015   Procedure: TRANSESOPHAGEAL ECHOCARDIOGRAM (TEE);  Surgeon: Jake Bathe, MD;  Location: [facility] ENDOSCOPY;  Service: Cardiovascular;  Laterality: N/A;  . TEE WITHOUT CARDIOVERSION N/A 06/17/2015   Procedure: TRANSESOPHAGEAL ECHOCARDIOGRAM (TEE);  Surgeon: Chilton Si, MD;  Location: [facility] ENDOSCOPY;  Service: Cardiovascular;  Laterality: N/A;  . uterosacral nerve ablation     with bipolar coagulation. Surgeon Dr. Jennette Kettle  . VESICOVAGINAL FISTULA CLOSURE W/ TAH      OB History    No data available       Home Medications    Prior to Admission medications   Medication Sig Start Date End Date Taking? Authorizing Provider  acetaminophen (TYLENOL) 500 MG tablet Take 1,000 mg by mouth at bedtime as needed (pain).   Yes [provider]  amLODipine (NORVASC) 2.5 MG tablet Take 1 tablet (2.5 mg total) by mouth daily. 02/11/17  Yes Sheilah Pigeon, PA-C  diltiazem (TAZTIA XT) 180 MG 24 hr capsule Take 2 capsules (360 mg total) by mouth daily. 04/21/17  Yes Corky Crafts, MD  Multiple Vitamin (MULTIVITAMIN WITH MINERALS) TABS tablet Take 1 tablet by mouth daily.   Yes [provider]  telmisartan-hydrochlorothiazide (MICARDIS HCT) 80-25 MG tablet Take 1 tablet by mouth daily. 04/20/17  Yes Corky Crafts, MD  traMADol (ULTRAM) 50 MG tablet Take 1 tablet by mouth as directed. 12/11/15  Yes [provider]  XARELTO 20 MG TABS tablet TAKE ONE TABLET BY MOUTH ONCE DAILY WITH SUPPER 03/21/17  Yes Allred, Fayrene Fearing, MD    Family History Family History  Problem Relation Age of Onset  . Cancer Father   . Other Mother        MVA    Social  History Social History  Substance Use Topics  . Smoking status: Former Games developer  . Smokeless tobacco: Never Used     Comment: has a 5 pack year hx. quit 4 years ago.   . Alcohol use Yes     Comment: occasional      Allergies   Shellfish allergy   Review of Systems Review of Systems  Constitutional: Negative for chills and fever.  Skin: Positive for wound. Negative for color change and pallor.  Hematological: Bruises/bleeds easily.     Physical Exam Updated Vital Signs BP (!) 169/95 (BP Location: Left Arm)   Pulse 80   Temp 98.5 F (36.9 C) (Oral)   Resp 18   Ht 5\' 9"   (1.753 m)   Wt 90.7 kg (200 lb)   SpO2 100%   BMI 29.53 kg/m   Physical Exam  Constitutional: She appears well-developed and well-nourished. No distress.  HENT:  Head: Normocephalic and atraumatic.  Cardiovascular: Normal rate.   Musculoskeletal:  Left upper distal extremity is warm and well perfused.  Skin: Skin is warm and dry. She is not diaphoretic.  Approximately 1 cm incision to left axilla with small/scant blood oozing. No purulent drainage, there is no abnormal surrounding erythema or edema.  On palpation the area is firm without fluctuance.  Psychiatric: Her behavior is normal.  Nursing note and vitals reviewed.    ED Treatments / Results  Labs (all labs ordered are listed, but only abnormal results are displayed) Labs Reviewed - No data to display  EKG  EKG Interpretation None       Radiology No results found.  Procedures Procedures (including critical care time) ULTRASOUND LIMITED SOFT TISSUE/ MUSCULOSKELETAL:  Indication: post cyst removal bleeding, firmness Linear probe used to evaluate area of interest in two planes. Findings:  approx 1.5x1.5 cm area of hypoechoic with about 0.5x0.5 cm area of hyperechoic tissue.  Performed by: Lyndel Safe PA-C Images saved electronically   Medications Ordered in ED Medications - No data to display   Initial Impression / Assessment and Plan / ED Course  I have reviewed the triage vital signs and the nursing notes.  Pertinent labs & imaging results that were available during my care of the patient were reviewed by me and considered in my medical decision making (see chart for details).    Jill Escobar presents for continued bleeding approximately 6 days after a reported cyst removal. She takes chronic Xarelto. Minimal/scant bleeding present, ultrasound used to evaluate the area. There is no fluctuance, abnormal erythema or edema suggestive of abscess formation or cellulitis. Suspect that the firmness is  result of clot.  Patient was informed that her wound may continue to bleed and 9, as she is on Xarelto, that is normal and expected.  Patient was instructed to follow-up with her dermatologist. Patient given return precautions. Patient given the option to ask questions, all of which were answered to the best of my abilities.  Patient seen by Dr. Donnald Garre.   While in the ED patient was noted to have an elevated blood pressure.  At this time there is nothing to suggest hypertensive urgency or emergency.  Patient was advised to follow up with their PCP or Wellness clinic regarding their elevated blood pressure.   Final Clinical Impressions(s) / ED Diagnoses   Final diagnoses:  Visit for wound check  Essential hypertension    New Prescriptions New Prescriptions   No medications on file     Cristina Gong, Cordelia Poche 06/12/17 1240  Arby BarrettePfeiffer, Marcy, MD 06/22/17 1544

## 2017-08-09 ENCOUNTER — Other Ambulatory Visit: Payer: Self-pay | Admitting: Physician Assistant

## 2017-08-16 ENCOUNTER — Other Ambulatory Visit: Payer: Self-pay | Admitting: Interventional Cardiology

## 2017-08-17 ENCOUNTER — Other Ambulatory Visit: Payer: Self-pay | Admitting: *Deleted

## 2017-08-17 MED ORDER — TELMISARTAN-HCTZ 80-25 MG PO TABS: 1.0000 | ORAL_TABLET | Freq: Every day | ORAL | 0 refills | Status: DC

## 2017-09-07 ENCOUNTER — Encounter (HOSPITAL_COMMUNITY): Payer: Self-pay | Admitting: Nurse Practitioner

## 2017-09-07 ENCOUNTER — Ambulatory Visit (HOSPITAL_COMMUNITY)
Admission: RE | Admit: 2017-09-07 | Discharge: 2017-09-07 | Disposition: A | Discharge status: Home / Self Care | Payer: Federal, State, Local not specified - PPO | Source: Ambulatory Visit | Attending: Nurse Practitioner | Admitting: Nurse Practitioner

## 2017-09-07 VITALS — BP 142/84 | HR 70 | Ht 69.0 in | Wt 202.0 lb

## 2017-09-07 DIAGNOSIS — I4891 Unspecified atrial fibrillation: Secondary | ICD-10-CM | POA: Insufficient documentation

## 2017-09-07 DIAGNOSIS — E876 Hypokalemia: Secondary | ICD-10-CM | POA: Insufficient documentation

## 2017-09-07 DIAGNOSIS — Z9889 Other specified postprocedural states: Secondary | ICD-10-CM | POA: Diagnosis not present

## 2017-09-07 DIAGNOSIS — Z91013 Allergy to seafood: Secondary | ICD-10-CM | POA: Insufficient documentation

## 2017-09-07 DIAGNOSIS — Z79899 Other long term (current) drug therapy: Secondary | ICD-10-CM | POA: Diagnosis not present

## 2017-09-07 DIAGNOSIS — Z87891 Personal history of nicotine dependence: Secondary | ICD-10-CM | POA: Insufficient documentation

## 2017-09-07 DIAGNOSIS — M1990 Unspecified osteoarthritis, unspecified site: Secondary | ICD-10-CM | POA: Diagnosis not present

## 2017-09-07 DIAGNOSIS — Z809 Family history of malignant neoplasm, unspecified: Secondary | ICD-10-CM | POA: Insufficient documentation

## 2017-09-07 DIAGNOSIS — Z7901 Long term (current) use of anticoagulants: Secondary | ICD-10-CM | POA: Insufficient documentation

## 2017-09-07 DIAGNOSIS — I10 Essential (primary) hypertension: Secondary | ICD-10-CM | POA: Insufficient documentation

## 2017-09-07 DIAGNOSIS — I480 Paroxysmal atrial fibrillation: Secondary | ICD-10-CM

## 2017-09-07 LAB — CBC
HCT: 34.4 % — ABNORMAL LOW (ref 36.0–46.0)
HEMOGLOBIN: 11.5 g/dL — AB (ref 12.0–15.0)
MCH: 31.8 pg (ref 26.0–34.0)
MCHC: 33.4 g/dL (ref 30.0–36.0)
MCV: 95 fL (ref 78.0–100.0)
PLATELETS: 244 10*3/uL (ref 150–400)
RBC: 3.62 MIL/uL — AB (ref 3.87–5.11)
RDW: 13.6 % (ref 11.5–15.5)
WBC: 5.1 10*3/uL (ref 4.0–10.5)

## 2017-09-07 LAB — BASIC METABOLIC PANEL
Anion gap: 9 (ref 5–15)
BUN: 21 mg/dL — ABNORMAL HIGH (ref 6–20)
CHLORIDE: 103 mmol/L (ref 101–111)
CO2: 25 mmol/L (ref 22–32)
CREATININE: 1.16 mg/dL — AB (ref 0.44–1.00)
Calcium: 9.3 mg/dL (ref 8.9–10.3)
GFR, EST AFRICAN AMERICAN: 59 mL/min — AB (ref >60)
GFR, EST NON AFRICAN AMERICAN: 50 mL/min — AB (ref >60)
Glucose, Bld: 98 mg/dL (ref 65–99)
Potassium: 3.5 mmol/L (ref 3.5–5.1)
SODIUM: 137 mmol/L (ref 135–145)

## 2017-09-07 MED ORDER — DILTIAZEM HCL ER BEADS 180 MG PO CP24: 360.0000 mg | ORAL_CAPSULE | Freq: Every day | ORAL | 6 refills | Status: DC

## 2017-09-07 MED ORDER — TELMISARTAN-HCTZ 80-25 MG PO TABS: 1.0000 | ORAL_TABLET | Freq: Every day | ORAL | 3 refills | Status: DC

## 2017-09-07 MED ORDER — AMLODIPINE BESYLATE 2.5 MG PO TABS: 2.5000 mg | ORAL_TABLET | Freq: Every day | ORAL | 3 refills | Status: DC

## 2017-09-07 MED ORDER — DILTIAZEM HCL 30 MG PO TABS: ORAL_TABLET | ORAL | 1 refills | Status: DC

## 2017-09-07 MED ORDER — RIVAROXABAN 20 MG PO TABS: ORAL_TABLET | ORAL | 1 refills | Status: DC

## 2017-09-07 NOTE — Patient Instructions (Signed)
Your physician has recommended you make the following change in your medication:  1)Cardizem 30mg  -- take 1 tablet every 4 hours AS NEEDED for AFIB heart rate over 100 as long as top number of blood pressure over 100.    Dr. Jenel LucksAllred's office will be in touch with you to schedule appointment to discuss repeat ablation.

## 2017-09-08 NOTE — Progress Notes (Signed)
Patient ID: Jana HalfCindy L Melrose, female   DOB: [birth year], 61 y.o.   MRN: [id number]     Primary Care Physician: Georgianne Fickamachandran, Ajith, MD Referring Physician: Dr. Fredrik CoveAllred   Jill Escobar is a 61 y.o. female with a h/o PAF, s/p afib ablation  06/2015, that I have not seen since 12/2015 but is in the office to be seen so she can get her med refills. She has seen her PCP in some time.  She reports periods of afib, 2x a week, that last several hours at a time. She usually continues her activities thru the spells but does not feel well. She used flecainide in the past before the first ablation and said that it did not work. She is not interested in using again. She would like to consider another ablation. She is in SR today.  Today, she denies symptoms of palpitations, chest pain, shortness of breath, orthopnea, PND, lower extremity edema, dizziness, presyncope, syncope, or neurologic sequela. The patient is tolerating medications without difficulties and is otherwise without complaint today.   Past Medical History:  Diagnosis Date  . Atrial fibrillation (HCC)   . HTN (hypertension)   . Hypokalemia   . Osteoarthritis    L hip  . Overweight    Past Surgical History:  Procedure Laterality Date  . CARDIAC ELECTROPHYSIOLOGY STUDY AND ABLATION    . D&C and removal of endonetrial polyp    . drainage of cyst     follicular functional-type of cysyt- L ovary   . JOINT REPLACEMENT Left 2012   hip  . LAPAROSCOPY     with lysis of pelvic adhesions   . NASAL ENDOSCOPY     upper  . uterosacral nerve ablation     with bipolar coagulation. Surgeon Dr. Jennette KettleNeal  . VESICOVAGINAL FISTULA CLOSURE W/ TAH      Current Outpatient Medications  Medication Sig Dispense Refill  . acetaminophen (TYLENOL) 500 MG tablet Take 1,000 mg by mouth at bedtime as needed (pain).    Marland Kitchen. amLODipine (NORVASC) 2.5 MG tablet Take 1 tablet (2.5 mg total) daily by mouth. 30 tablet 3  . diltiazem (TAZTIA XT) 180 MG 24 hr capsule Take  2 capsules (360 mg total) daily by mouth. 60 capsule 6  . Multiple Vitamin (MULTIVITAMIN WITH MINERALS) TABS tablet Take 1 tablet by mouth daily.    . rivaroxaban (XARELTO) 20 MG TABS tablet TAKE ONE TABLET BY MOUTH ONCE DAILY WITH SUPPER 90 tablet 1  . telmisartan-hydrochlorothiazide (MICARDIS HCT) 80-25 MG tablet Take 1 tablet daily by mouth. 30 tablet 3  . diltiazem (CARDIZEM) 30 MG tablet Take 1 tablet every 4 hours AS NEEDED for afib heart rate over 100 45 tablet 1   Current Facility-Administered Medications  Medication Dose Route Frequency Provider Last Rate Last Dose  . cloNIDine (CATAPRES) tablet 0.1 mg  0.1 mg Oral Once Rosalio MacadamiaGerhardt, Lori C, NP        Allergies  Allergen Reactions  . Shellfish Allergy Hives    Social History   Socioeconomic History  . Marital status: Married    Spouse name: Not on file  . Number of children: Not on file  . Years of education: Not on file  . Highest education level: Not on file  Social Needs  . Financial resource strain: Not on file  . Food insecurity - worry: Not on file  . Food insecurity - inability: Not on file  . Transportation needs - medical: Not on file  . Transportation needs -  non-medical: Not on file  Occupational History  . Not on file  Tobacco Use  . Smoking status: Former Games developermoker  . Smokeless tobacco: Never Used  . Tobacco comment: has a 5 pack year hx. quit 4 years ago.   Substance and Sexual Activity  . Alcohol use: Yes    Comment: occasional   . Drug use: No  . Sexual activity: Not Currently  Other Topics Concern  . Not on file  Social History Narrative   [place] in [place] her [place]. Has 2 children.    [employer] adviserclerk worker.    Walks her dog approx. 30 min/day. Has no specific diet     Family History  Problem Relation Age of Onset  . Cancer Father   . Other Mother        MVA    ROS- All systems are reviewed and negative except as per the HPI above  Physical Exam: Vitals:   09/07/17 1508  BP: (!)  142/84  Pulse: 70  Weight: 202 lb (91.6 kg)  Height: 5\' 9"  (1.753 m)    GEN- The patient is well appearing, alert and oriented x 3 today.   Head- normocephalic, atraumatic Eyes-  Sclera clear, conjunctiva pink Ears- hearing intact Oropharynx- clear Neck- supple, no JVP Lymph- no cervical lymphadenopathy Lungs- Clear to ausculation bilaterally, normal work of breathing Heart- Regular rate and rhythm, no murmurs, rubs or gallops, PMI not laterally displaced GI- soft, NT, ND, + BS Extremities- no clubbing, cyanosis, or edema MS- no significant deformity or atrophy Skin- no rash or lesion Psych- euthymic mood, full affect Neuro- strength and sensation are intact  EKG- SR at 70 bpm, pr int 142 ms, qrs int 78 ms, qtc 442 ms Epic records reviewed  Assessment and Plan: 1. Afib  Pt reports increased afib burden  Does not want to consider flecainide or other antiarrythmic's She wants to discuss with Dr. Johney FrameAllred repeat ablation  Continue on diltiazem 180 mg bid Will RX 30 mg Cardizem as needed Other meds refilled Continue xarelto 20 mg qd for chadsvasc score of 2 Cbc/bmet  2. HTN Stable   Will request f/u  with Dr. Johney FrameAllred to discuss repeat ablation   Jill Escobar, ANP-C [facility] [address] [place], [place] [address] [phone]

## 2017-09-26 ENCOUNTER — Ambulatory Visit: Payer: Federal, State, Local not specified - PPO | Admitting: Internal Medicine

## 2017-09-26 ENCOUNTER — Encounter: Payer: Self-pay | Admitting: Internal Medicine

## 2017-09-26 VITALS — BP 136/86 | HR 70 | Ht 69.0 in | Wt 202.4 lb

## 2017-09-26 DIAGNOSIS — R002 Palpitations: Secondary | ICD-10-CM | POA: Diagnosis not present

## 2017-09-26 DIAGNOSIS — I480 Paroxysmal atrial fibrillation: Secondary | ICD-10-CM

## 2017-09-26 DIAGNOSIS — I10 Essential (primary) hypertension: Secondary | ICD-10-CM | POA: Diagnosis not present

## 2017-09-26 NOTE — H&P (View-Only) (Signed)
 PCP: Ramachandran, Ajith, MD Primary Cardiologist: Dr Skains Primary EP: Dr Mairin Lindsley  Jill Escobar is a 60 y.o. female who presents today for routine electrophysiology followup.  Since last being seen in our clinic, the patient reports doing reasonably well.  I have not seen her since 2/17.  She has not had regular follow-up.  She presents due to concerns of "episodes".  she reports having "hot flashes" with palpitations.  These are scattered but last about 45 minutes.  she thinks that these may be due to "menapause".  She has not had documented afib post ablation.  She thinks that her current symptoms feel "different".  Today, she denies symptoms of chest pain, shortness of breath,  lower extremity edema, dizziness, presyncope, or syncope.  The patient is otherwise without complaint today.   Past Medical History:  Diagnosis Date  . Atrial fibrillation (HCC)   . HTN (hypertension)   . Hypokalemia   . Osteoarthritis    L hip  . Overweight    Past Surgical History:  Procedure Laterality Date  . CARDIAC ELECTROPHYSIOLOGY STUDY AND ABLATION    . D&C and removal of endonetrial polyp    . drainage of cyst     follicular functional-type of cysyt- L ovary   . ELECTROPHYSIOLOGIC STUDY N/A 06/17/2015   Procedure: Atrial Fibrillation Ablation;  Surgeon: Talin Rozeboom, MD;  Location: [facility] INVASIVE CV LAB;  Service: Cardiovascular;  Laterality: N/A;  . JOINT REPLACEMENT Left 2012   hip  . LAPAROSCOPY     with lysis of pelvic adhesions   . NASAL ENDOSCOPY     upper  . TEE WITHOUT CARDIOVERSION N/A 04/14/2015   Procedure: TRANSESOPHAGEAL ECHOCARDIOGRAM (TEE);  Surgeon: Mark C Skains, MD;  Location: [facility] ENDOSCOPY;  Service: Cardiovascular;  Laterality: N/A;  . TEE WITHOUT CARDIOVERSION N/A 06/17/2015   Procedure: TRANSESOPHAGEAL ECHOCARDIOGRAM (TEE);  Surgeon: Tiffany , MD;  Location: [facility] ENDOSCOPY;  Service: Cardiovascular;  Laterality: N/A;  . uterosacral nerve ablation     with bipolar  coagulation. Surgeon Dr. Neal  . VESICOVAGINAL FISTULA CLOSURE W/ TAH      ROS- all systems are reviewed and negatives except as per HPI above  Current Outpatient Medications  Medication Sig Dispense Refill  . acetaminophen (TYLENOL) 500 MG tablet Take 1,000 mg by mouth at bedtime as needed (pain).    . amLODipine (NORVASC) 2.5 MG tablet Take 1 tablet (2.5 mg total) daily by mouth. 30 tablet 3  . diltiazem (CARDIZEM) 30 MG tablet Take 1 tablet every 4 hours AS NEEDED for afib heart rate over 100 45 tablet 1  . diltiazem (TIAZAC) 180 MG 24 hr capsule Take 180 mg by mouth daily.    . Multiple Vitamin (MULTIVITAMIN WITH MINERALS) TABS tablet Take 1 tablet by mouth daily.    . rivaroxaban (XARELTO) 20 MG TABS tablet TAKE ONE TABLET BY MOUTH ONCE DAILY WITH SUPPER 90 tablet 1  . telmisartan-hydrochlorothiazide (MICARDIS HCT) 80-25 MG tablet Take 1 tablet daily by mouth. 30 tablet 3   Current Facility-Administered Medications  Medication Dose Route Frequency Provider Last Rate Last Dose  . cloNIDine (CATAPRES) tablet 0.1 mg  0.1 mg Oral Once Gerhardt, Lori C, NP        Physical Exam: Vitals:   09/26/17 0853  BP: 136/86  Pulse: 70  SpO2: 99%  Weight: 202 lb 6.4 oz (91.8 kg)  Height: 5' 9" (1.753 m)    GEN- The patient is well appearing, alert and oriented x 3 today.     Head- normocephalic, atraumatic Eyes-  Sclera clear, conjunctiva pink Ears- hearing intact Oropharynx- clear Lungs- Clear to ausculation bilaterally, normal work of breathing Heart- Regular rate and rhythm, no murmurs, rubs or gallops, PMI not laterally displaced GI- soft, NT, ND, + BS Extremities- no clubbing, cyanosis, or edema  EKG tracing ordered today is personally reviewed and shows sinus rhythm, normal ekg  Assessment and Plan:  1. Paroxysmal atrial fibrillation She has done very well post ablation off AAD therapy Unfortunately, follow-up has been poor She now presents with palpitations of unclear  etiology.  We have not been able to document arrhythmia post ablation.  I am not convinced that she is having afib.  I think that we should better characterize her palpitations post ablation before pursuing any new treatment options.  Today, we discussed 30 day monitor and implantable loop recorder at length.  Risks and benefits to ILR were discussed.  She is very clear that she would like to proceed with ILR placement and is excited about the technology and its potential to further evaluate her palpitations post afib ablation.  2. Palpitations As above  3. HTN Stable No change required today  Return in 2 months post ILR implant to discuss findings  Hillis RangeJames Emilija Bohman MD, [facility] 09/26/2017 9:15 AM

## 2017-09-26 NOTE — Progress Notes (Signed)
PCP: Georgianne Fickamachandran, Ajith, MD Primary Cardiologist: Dr Anne FuSkains Primary EP: Dr Johney FrameAllred  Jill Escobar is a 61 y.o. female who presents today for routine electrophysiology followup.  Since last being seen in our clinic, the patient reports doing reasonably well.  I have not seen her since 2/17.  She has not had regular follow-up.  She presents due to concerns of "episodes".  she reports having "hot flashes" with palpitations.  These are scattered but last about 45 minutes.  she thinks that these may be due to "menapause".  She has not had documented afib post ablation.  She thinks that her current symptoms feel "different".  Today, she denies symptoms of chest pain, shortness of breath,  lower extremity edema, dizziness, presyncope, or syncope.  The patient is otherwise without complaint today.   Past Medical History:  Diagnosis Date  . Atrial fibrillation (HCC)   . HTN (hypertension)   . Hypokalemia   . Osteoarthritis    L hip  . Overweight    Past Surgical History:  Procedure Laterality Date  . CARDIAC ELECTROPHYSIOLOGY STUDY AND ABLATION    . D&C and removal of endonetrial polyp    . drainage of cyst     follicular functional-type of cysyt- L ovary   . ELECTROPHYSIOLOGIC STUDY N/A 06/17/2015   Procedure: Atrial Fibrillation Ablation;  Surgeon: Hillis RangeJames Tyla Burgner, MD;  [facility];  Service: Cardiovascular;  Laterality: N/A;  . JOINT REPLACEMENT Left 2012   hip  . LAPAROSCOPY     with lysis of pelvic adhesions   . NASAL ENDOSCOPY     upper  . TEE WITHOUT CARDIOVERSION N/A 04/14/2015   Procedure: TRANSESOPHAGEAL ECHOCARDIOGRAM (TEE);  Surgeon: Jake BatheMark C Skains, MD;  [facility];  Service: Cardiovascular;  Laterality: N/A;  . TEE WITHOUT CARDIOVERSION N/A 06/17/2015   Procedure: TRANSESOPHAGEAL ECHOCARDIOGRAM (TEE);  Surgeon: Chilton Siiffany [place], MD;  [facility];  Service: Cardiovascular;  Laterality: N/A;  . uterosacral nerve ablation     with bipolar  coagulation. Surgeon Dr. Jennette KettleNeal  . VESICOVAGINAL FISTULA CLOSURE W/ TAH      ROS- all systems are reviewed and negatives except as per HPI above  Current Outpatient Medications  Medication Sig Dispense Refill  . acetaminophen (TYLENOL) 500 MG tablet Take 1,000 mg by mouth at bedtime as needed (pain).    Marland Kitchen. amLODipine (NORVASC) 2.5 MG tablet Take 1 tablet (2.5 mg total) daily by mouth. 30 tablet 3  . diltiazem (CARDIZEM) 30 MG tablet Take 1 tablet every 4 hours AS NEEDED for afib heart rate over 100 45 tablet 1  . diltiazem (TIAZAC) 180 MG 24 hr capsule Take 180 mg by mouth daily.    . Multiple Vitamin (MULTIVITAMIN WITH MINERALS) TABS tablet Take 1 tablet by mouth daily.    . rivaroxaban (XARELTO) 20 MG TABS tablet TAKE ONE TABLET BY MOUTH ONCE DAILY WITH SUPPER 90 tablet 1  . telmisartan-hydrochlorothiazide (MICARDIS HCT) 80-25 MG tablet Take 1 tablet daily by mouth. 30 tablet 3   Current Facility-Administered Medications  Medication Dose Route Frequency Provider Last Rate Last Dose  . cloNIDine (CATAPRES) tablet 0.1 mg  0.1 mg Oral Once Rosalio MacadamiaGerhardt, Lori C, NP        Physical Exam: Vitals:   09/26/17 0853  BP: 136/86  Pulse: 70  SpO2: 99%  Weight: 202 lb 6.4 oz (91.8 kg)  Height: 5\' 9"  (1.753 m)    GEN- The patient is well appearing, alert and oriented x 3 today.  Head- normocephalic, atraumatic Eyes-  Sclera clear, conjunctiva pink Ears- hearing intact Oropharynx- clear Lungs- Clear to ausculation bilaterally, normal work of breathing Heart- Regular rate and rhythm, no murmurs, rubs or gallops, PMI not laterally displaced GI- soft, NT, ND, + BS Extremities- no clubbing, cyanosis, or edema  EKG tracing ordered today is personally reviewed and shows sinus rhythm, normal ekg  Assessment and Plan:  1. Paroxysmal atrial fibrillation She has done very well post ablation off AAD therapy Unfortunately, follow-up has been poor She now presents with palpitations of unclear  etiology.  We have not been able to document arrhythmia post ablation.  I am not convinced that she is having afib.  I think that we should better characterize her palpitations post ablation before pursuing any new treatment options.  Today, we discussed 30 day monitor and implantable loop recorder at length.  Risks and benefits to ILR were discussed.  She is very clear that she would like to proceed with ILR placement and is excited about the technology and its potential to further evaluate her palpitations post afib ablation.  2. Palpitations As above  3. HTN Stable No change required today  Return in 2 months post ILR implant to discuss findings  Hillis RangeJames Elonna Mcfarlane MD, [facility] 09/26/2017 9:15 AM

## 2017-09-26 NOTE — Patient Instructions (Addendum)
Medication Instructions:  Your physician recommends that you continue on your current medications as directed. Please refer to the Current Medication list given to you today.   Labwork: None ordered   Testing/Procedures:  LINQ implant 09/27/17  [facility]30[facility]  Follow-Up:  Your physician recommends that you schedule a follow-up appointment in: 10-14 days in device clinic for wound check and 2 months with Dr Johney FrameAllred   Any Other Special Instructions Will Be Listed Below (If Applicable).     If you need a refill on your cardiac medications before your next appointment, please call your pharmacy.

## 2017-09-27 ENCOUNTER — Ambulatory Visit (HOSPITAL_COMMUNITY)
Admission: RE | Admit: 2017-09-27 | Discharge: 2017-09-27 | Disposition: A | Discharge status: Home / Self Care | Payer: Federal, State, Local not specified - PPO | Source: Ambulatory Visit | Attending: Internal Medicine | Admitting: Internal Medicine

## 2017-09-27 ENCOUNTER — Encounter (HOSPITAL_COMMUNITY): Payer: Self-pay | Admitting: Internal Medicine

## 2017-09-27 ENCOUNTER — Encounter (HOSPITAL_COMMUNITY)
Admission: RE | Disposition: A | Discharge status: Home / Self Care | Payer: Self-pay | Source: Ambulatory Visit | Attending: Internal Medicine

## 2017-09-27 DIAGNOSIS — E663 Overweight: Secondary | ICD-10-CM | POA: Insufficient documentation

## 2017-09-27 DIAGNOSIS — Z7901 Long term (current) use of anticoagulants: Secondary | ICD-10-CM | POA: Insufficient documentation

## 2017-09-27 DIAGNOSIS — Z6829 Body mass index (BMI) 29.0-29.9, adult: Secondary | ICD-10-CM | POA: Insufficient documentation

## 2017-09-27 DIAGNOSIS — M1990 Unspecified osteoarthritis, unspecified site: Secondary | ICD-10-CM | POA: Diagnosis not present

## 2017-09-27 DIAGNOSIS — I10 Essential (primary) hypertension: Secondary | ICD-10-CM | POA: Insufficient documentation

## 2017-09-27 DIAGNOSIS — I4891 Unspecified atrial fibrillation: Secondary | ICD-10-CM | POA: Diagnosis not present

## 2017-09-27 DIAGNOSIS — R002 Palpitations: Secondary | ICD-10-CM | POA: Diagnosis not present

## 2017-09-27 DIAGNOSIS — I480 Paroxysmal atrial fibrillation: Secondary | ICD-10-CM | POA: Insufficient documentation

## 2017-09-27 HISTORY — PX: LOOP RECORDER INSERTION: EP1214

## 2017-09-27 SURGERY — LOOP RECORDER INSERTION

## 2017-09-27 MED ORDER — LIDOCAINE-EPINEPHRINE 1 %-1:100000 IJ SOLN
INTRAMUSCULAR | Status: AC
  Filled 2017-09-27: qty 1

## 2017-09-27 MED ORDER — LIDOCAINE-EPINEPHRINE 1 %-1:100000 IJ SOLN
INTRAMUSCULAR | Status: DC | PRN
  Administered 2017-09-27: 20 mL

## 2017-09-27 SURGICAL SUPPLY — 2 items
LOOP REVEAL LINQSYS (Prosthesis & Implant Heart) ×2 IMPLANT
PACK LOOP INSERTION (CUSTOM PROCEDURE TRAY) ×2 IMPLANT

## 2017-09-27 NOTE — Interval H&P Note (Signed)
History and Physical Interval Note:  09/27/2017 7:28 AM  Jill Escobar  has presented today for surgery, with the diagnosis of afib  The various methods of treatment have been discussed with the patient and family. After consideration of risks, benefits and other options for treatment, the patient has consented to  Procedure(s): LOOP RECORDER INSERTION (N/A) as a surgical intervention .  The patient's history has been reviewed, patient examined, no change in status, stable for surgery.  I have reviewed the patient's chart and labs.  Questions were answered to the patient's satisfaction.     Hillis RangeJames Dioselina Brumbaugh

## 2017-09-27 NOTE — Progress Notes (Signed)
Implantable loop recorder discharge instructions reviewed with pt with stated understanding. Dressing left chest clean dry and intact prior to discharge. Pt ambulated out of short stay with husband.

## 2017-10-06 ENCOUNTER — Ambulatory Visit (INDEPENDENT_AMBULATORY_CARE_PROVIDER_SITE_OTHER): Payer: Self-pay | Admitting: *Deleted

## 2017-10-06 DIAGNOSIS — I480 Paroxysmal atrial fibrillation: Secondary | ICD-10-CM

## 2017-10-06 LAB — CUP PACEART INCLINIC DEVICE CHECK
Implantable Pulse Generator Implant Date: 20181127
MDC IDC SESS DTM: 20181206092605

## 2017-10-06 NOTE — Progress Notes (Signed)
Wound check appointment. Steri-strips removed by patient prior to appointment. Wound without redness or edema. Incision edges approximated, wound well healed. Battery status: Good. R-waves 0.7555mV. 0 symptom episodes, 0 tachy episodes, 0 pause episodes, 0 brady episodes. 0 AF episodes (0% burden). Monthly summary reports and ROV with JA 11/28/17. Educated patient on symptom activator using teach back method and home monitor use.

## 2017-10-20 ENCOUNTER — Ambulatory Visit: Payer: Federal, State, Local not specified - PPO | Admitting: Nurse Practitioner

## 2017-10-28 ENCOUNTER — Ambulatory Visit (INDEPENDENT_AMBULATORY_CARE_PROVIDER_SITE_OTHER): Payer: Federal, State, Local not specified - PPO | Admitting: *Deleted

## 2017-10-28 DIAGNOSIS — I480 Paroxysmal atrial fibrillation: Secondary | ICD-10-CM

## 2017-10-28 NOTE — Progress Notes (Signed)
Carelink Summary Report / Loop Recorder 

## 2017-11-07 LAB — CUP PACEART REMOTE DEVICE CHECK
Implantable Pulse Generator Implant Date: 20181127
MDC IDC SESS DTM: 20181228010844

## 2017-11-28 ENCOUNTER — Encounter: Payer: Self-pay | Admitting: Internal Medicine

## 2017-11-28 ENCOUNTER — Ambulatory Visit (INDEPENDENT_AMBULATORY_CARE_PROVIDER_SITE_OTHER): Payer: Federal, State, Local not specified - PPO | Admitting: *Deleted

## 2017-11-28 ENCOUNTER — Ambulatory Visit: Payer: Federal, State, Local not specified - PPO | Admitting: Internal Medicine

## 2017-11-28 VITALS — BP 134/74 | HR 70 | Ht 69.0 in | Wt 200.0 lb

## 2017-11-28 DIAGNOSIS — I10 Essential (primary) hypertension: Secondary | ICD-10-CM | POA: Diagnosis not present

## 2017-11-28 DIAGNOSIS — I480 Paroxysmal atrial fibrillation: Secondary | ICD-10-CM

## 2017-11-28 NOTE — Progress Notes (Signed)
Carelink Summary Report / Loop Recorder 

## 2017-11-28 NOTE — Patient Instructions (Addendum)

## 2017-11-28 NOTE — Progress Notes (Signed)
PCP: Georgianne Fickamachandran, Ajith, MD Primary Cardiologist: Dr Anne FuSkains Primary EP: Dr Johney FrameAllred  Jill Escobar is a 62 y.o. female who presents today for routine electrophysiology followup.  Since last being seen in our clinic, the patient reports doing very well.  Today, she denies symptoms of palpitations, chest pain, shortness of breath,  lower extremity edema, dizziness, presyncope, or syncope.  The patient is otherwise without complaint today.   Past Medical History:  Diagnosis Date  . Atrial fibrillation (HCC)   . HTN (hypertension)   . Hypokalemia   . Osteoarthritis    L hip  . Overweight    Past Surgical History:  Procedure Laterality Date  . CARDIAC ELECTROPHYSIOLOGY STUDY AND ABLATION    . D&C and removal of endonetrial polyp    . drainage of cyst     follicular functional-type of cysyt- L ovary   . ELECTROPHYSIOLOGIC STUDY N/A 06/17/2015   Procedure: Atrial Fibrillation Ablation;  Surgeon: Hillis RangeJames Corley Maffeo, MD;  [facility];  Service: Cardiovascular;  Laterality: N/A;  . JOINT REPLACEMENT Left 2012   hip  . LAPAROSCOPY     with lysis of pelvic adhesions   . LOOP RECORDER INSERTION N/A 09/27/2017   Procedure: LOOP RECORDER INSERTION;  Surgeon: Hillis RangeAllred, Ramel Tobon, MD;  Location: [facility] LAB;  Service: Cardiovascular;  Laterality: N/A;  . NASAL ENDOSCOPY     upper  . TEE WITHOUT CARDIOVERSION N/A 04/14/2015   Procedure: TRANSESOPHAGEAL ECHOCARDIOGRAM (TEE);  Surgeon: Jake BatheMark C Skains, MD;  [facility];  Service: Cardiovascular;  Laterality: N/A;  . TEE WITHOUT CARDIOVERSION N/A 06/17/2015   Procedure: TRANSESOPHAGEAL ECHOCARDIOGRAM (TEE);  Surgeon: Chilton Siiffany [place], MD;  [facility];  Service: Cardiovascular;  Laterality: N/A;  . uterosacral nerve ablation     with bipolar coagulation. Surgeon Dr. Jennette KettleNeal  . VESICOVAGINAL FISTULA CLOSURE W/ TAH      ROS- all systems are reviewed and negatives except as per HPI above  Current Outpatient  Medications  Medication Sig Dispense Refill  . acetaminophen (TYLENOL) 500 MG tablet Take 1,000 mg by mouth at bedtime as needed (pain).    Marland Kitchen. amLODipine (NORVASC) 2.5 MG tablet Take 1 tablet (2.5 mg total) daily by mouth. 30 tablet 3  . diltiazem (CARDIZEM) 30 MG tablet Take 1 tablet every 4 hours AS NEEDED for afib heart rate over 100 45 tablet 1  . diltiazem (TIAZAC) 180 MG 24 hr capsule Take 180 mg by mouth daily.    Marland Kitchen. doxylamine, Sleep, (UNISOM) 25 MG tablet Take 25 mg by mouth at bedtime as needed for sleep.    . Multiple Vitamin (MULTIVITAMIN WITH MINERALS) TABS tablet Take 1 tablet by mouth daily.    . Nutritional Supplements (ESTROVEN PO) Take 1 tablet by mouth daily.    . rivaroxaban (XARELTO) 20 MG TABS tablet TAKE ONE TABLET BY MOUTH ONCE DAILY WITH SUPPER 90 tablet 1  . telmisartan-hydrochlorothiazide (MICARDIS HCT) 80-25 MG tablet Take 1 tablet daily by mouth. 30 tablet 3   Current Facility-Administered Medications  Medication Dose Route Frequency Provider Last Rate Last Dose  . cloNIDine (CATAPRES) tablet 0.1 mg  0.1 mg Oral Once Rosalio MacadamiaGerhardt, Lori C, NP        Physical Exam: Vitals:   11/28/17 1247  BP: 134/74  Pulse: 70  SpO2: 97%  Weight: 200 lb (90.7 kg)  Height: 5\' 9"  (1.753 m)    GEN- The patient is well appearing, alert and oriented x 3 today.   Head- normocephalic, atraumatic  Eyes-  Sclera clear, conjunctiva pink Ears- hearing intact Oropharynx- clear Lungs- Clear to ausculation bilaterally, normal work of breathing Heart- Regular rate and rhythm, no murmurs, rubs or gallops, PMI not laterally displaced GI- soft, NT, ND, + BS Extremities- no clubbing, cyanosis, or edema   Assessment and Plan:  1. Paroxysmal atrial fibrillation ILR is reviewed and reveals no afib off AAD therapy post ablation chads2vasc score is 2.  On xarelto.  She may want to stop anticoagulation if no further AF is seen on her ILR.  I have encouraged her to continue for now.  2.  HTN Stable No change required today  Carelink Return to see me in 6 months  Hillis Range MD, [facility] 11/28/2017 1:03 PM

## 2017-12-08 LAB — CUP PACEART REMOTE DEVICE CHECK
Date Time Interrogation Session: 20190127014002
MDC IDC PG IMPLANT DT: 20181127

## 2017-12-29 ENCOUNTER — Ambulatory Visit (INDEPENDENT_AMBULATORY_CARE_PROVIDER_SITE_OTHER): Payer: Federal, State, Local not specified - PPO | Admitting: *Deleted

## 2017-12-29 DIAGNOSIS — I480 Paroxysmal atrial fibrillation: Secondary | ICD-10-CM | POA: Diagnosis not present

## 2017-12-30 NOTE — Progress Notes (Signed)
Carelink Summary Report / Loop Recorder 

## 2018-01-04 DIAGNOSIS — L720 Epidermal cyst: Secondary | ICD-10-CM | POA: Diagnosis not present

## 2018-01-11 ENCOUNTER — Telehealth: Payer: Self-pay | Admitting: *Deleted

## 2018-01-11 NOTE — Telephone Encounter (Signed)
Jill Escobar is returning your call

## 2018-01-11 NOTE — Telephone Encounter (Signed)
Attempted to reach pt via cell and home phone no answer at either.

## 2018-01-11 NOTE — Telephone Encounter (Signed)
LMOVM requesting call back to the [facility].  Gave direct number.  AF episode noted on LINQ on 01/10/18 at 0003, duration 1hr 6min.  Per Dr. Johney FrameAllred, plan to continue Xarelto 20mg  daily, at least until next f/u with Dr. Johney FrameAllred in 05/2018 when burden can be reassessed.

## 2018-01-13 NOTE — Telephone Encounter (Signed)
Spoke with patient regarding Dr. Jenel LucksAllred's recommendations to continue Xarelto 20mg  daily.  Patient reports that she did stop the Xarelto for about 2 weeks but then restarted it yesterday since she felt the AF episode on 3/12.  She reports that she could feel the irregularity, but she denies symptoms of ShOB, chest discomfort, or dizziness during episode.  She still has Xarelto bottle at home and has a refill on file.  Encouraged patient to call our office with new or worsening symptoms, or if her AF symptoms are lasting longer than usual.  Patient is agreeable to next f/u with Dr. Johney FrameAllred in 05/2018 as previously planned.  She is appreciative of call and denies additional questions or concerns at this time.

## 2018-01-18 DIAGNOSIS — L720 Epidermal cyst: Secondary | ICD-10-CM | POA: Diagnosis not present

## 2018-01-23 ENCOUNTER — Other Ambulatory Visit: Payer: Self-pay | Admitting: Internal Medicine

## 2018-01-31 ENCOUNTER — Ambulatory Visit (INDEPENDENT_AMBULATORY_CARE_PROVIDER_SITE_OTHER): Payer: Federal, State, Local not specified - PPO | Admitting: *Deleted

## 2018-01-31 DIAGNOSIS — I480 Paroxysmal atrial fibrillation: Secondary | ICD-10-CM

## 2018-02-01 DIAGNOSIS — L720 Epidermal cyst: Secondary | ICD-10-CM | POA: Diagnosis not present

## 2018-02-01 NOTE — Progress Notes (Signed)
Carelink Summary Report / Loop Recorder 

## 2018-02-06 LAB — CUP PACEART REMOTE DEVICE CHECK
MDC IDC PG IMPLANT DT: 20181127
MDC IDC SESS DTM: 20190301014157

## 2018-02-27 ENCOUNTER — Other Ambulatory Visit (HOSPITAL_COMMUNITY): Payer: Self-pay | Admitting: Nurse Practitioner

## 2018-03-06 ENCOUNTER — Ambulatory Visit (INDEPENDENT_AMBULATORY_CARE_PROVIDER_SITE_OTHER): Payer: Federal, State, Local not specified - PPO | Admitting: *Deleted

## 2018-03-06 DIAGNOSIS — I480 Paroxysmal atrial fibrillation: Secondary | ICD-10-CM

## 2018-03-06 NOTE — Progress Notes (Signed)
Carelink Summary Report / Loop Recorder 

## 2018-03-08 LAB — CUP PACEART REMOTE DEVICE CHECK
MDC IDC PG IMPLANT DT: 20181127
MDC IDC SESS DTM: 20190403020858

## 2018-03-13 DIAGNOSIS — L720 Epidermal cyst: Secondary | ICD-10-CM | POA: Diagnosis not present

## 2018-03-28 LAB — CUP PACEART REMOTE DEVICE CHECK
Date Time Interrogation Session: 20190506023715
MDC IDC PG IMPLANT DT: 20181127

## 2018-04-07 ENCOUNTER — Ambulatory Visit (INDEPENDENT_AMBULATORY_CARE_PROVIDER_SITE_OTHER): Payer: Federal, State, Local not specified - PPO | Admitting: *Deleted

## 2018-04-07 DIAGNOSIS — I480 Paroxysmal atrial fibrillation: Secondary | ICD-10-CM | POA: Diagnosis not present

## 2018-04-10 NOTE — Progress Notes (Signed)
Carelink Summary Report / Loop Recorder 

## 2018-04-17 ENCOUNTER — Ambulatory Visit: Payer: Self-pay | Admitting: Surgery

## 2018-04-17 DIAGNOSIS — L989 Disorder of the skin and subcutaneous tissue, unspecified: Secondary | ICD-10-CM | POA: Diagnosis not present

## 2018-04-27 ENCOUNTER — Encounter (HOSPITAL_BASED_OUTPATIENT_CLINIC_OR_DEPARTMENT_OTHER): Payer: Self-pay | Admitting: *Deleted

## 2018-04-28 ENCOUNTER — Encounter (HOSPITAL_BASED_OUTPATIENT_CLINIC_OR_DEPARTMENT_OTHER)
Admission: RE | Admit: 2018-04-28 | Discharge: 2018-04-28 | Disposition: A | Discharge status: Home / Self Care | Payer: Federal, State, Local not specified - PPO | Source: Ambulatory Visit | Attending: Surgery | Admitting: Surgery

## 2018-04-28 DIAGNOSIS — Z01812 Encounter for preprocedural laboratory examination: Secondary | ICD-10-CM | POA: Diagnosis not present

## 2018-04-28 LAB — BASIC METABOLIC PANEL
ANION GAP: 9 (ref 5–15)
BUN: 19 mg/dL (ref 6–20)
CALCIUM: 9.6 mg/dL (ref 8.9–10.3)
CO2: 29 mmol/L (ref 22–32)
Chloride: 103 mmol/L (ref 98–111)
Creatinine, Ser: 1.03 mg/dL — ABNORMAL HIGH (ref 0.44–1.00)
GFR, EST NON AFRICAN AMERICAN: 58 mL/min — AB (ref >60)
Glucose, Bld: 96 mg/dL (ref 70–99)
POTASSIUM: 3.8 mmol/L (ref 3.5–5.1)
Sodium: 141 mmol/L (ref 135–145)

## 2018-05-02 ENCOUNTER — Ambulatory Visit (HOSPITAL_BASED_OUTPATIENT_CLINIC_OR_DEPARTMENT_OTHER): Payer: Federal, State, Local not specified - PPO | Admitting: Anesthesiology

## 2018-05-02 ENCOUNTER — Encounter (HOSPITAL_BASED_OUTPATIENT_CLINIC_OR_DEPARTMENT_OTHER): Payer: Self-pay | Admitting: Surgery

## 2018-05-02 ENCOUNTER — Ambulatory Visit (HOSPITAL_BASED_OUTPATIENT_CLINIC_OR_DEPARTMENT_OTHER)
Admission: RE | Admit: 2018-05-02 | Discharge: 2018-05-02 | Disposition: A | Discharge status: Home / Self Care | Payer: Federal, State, Local not specified - PPO | Source: Ambulatory Visit | Attending: Surgery | Admitting: Surgery

## 2018-05-02 ENCOUNTER — Encounter (HOSPITAL_BASED_OUTPATIENT_CLINIC_OR_DEPARTMENT_OTHER)
Admission: RE | Disposition: A | Discharge status: Home / Self Care | Payer: Self-pay | Source: Ambulatory Visit | Attending: Surgery

## 2018-05-02 ENCOUNTER — Other Ambulatory Visit: Payer: Self-pay

## 2018-05-02 DIAGNOSIS — L720 Epidermal cyst: Secondary | ICD-10-CM | POA: Diagnosis not present

## 2018-05-02 DIAGNOSIS — Z79899 Other long term (current) drug therapy: Secondary | ICD-10-CM | POA: Diagnosis not present

## 2018-05-02 DIAGNOSIS — L728 Other follicular cysts of the skin and subcutaneous tissue: Secondary | ICD-10-CM | POA: Diagnosis not present

## 2018-05-02 DIAGNOSIS — M1990 Unspecified osteoarthritis, unspecified site: Secondary | ICD-10-CM | POA: Diagnosis not present

## 2018-05-02 DIAGNOSIS — I10 Essential (primary) hypertension: Secondary | ICD-10-CM | POA: Insufficient documentation

## 2018-05-02 DIAGNOSIS — Z87891 Personal history of nicotine dependence: Secondary | ICD-10-CM | POA: Diagnosis not present

## 2018-05-02 DIAGNOSIS — L989 Disorder of the skin and subcutaneous tissue, unspecified: Secondary | ICD-10-CM

## 2018-05-02 DIAGNOSIS — M1612 Unilateral primary osteoarthritis, left hip: Secondary | ICD-10-CM | POA: Diagnosis not present

## 2018-05-02 DIAGNOSIS — I4891 Unspecified atrial fibrillation: Secondary | ICD-10-CM | POA: Insufficient documentation

## 2018-05-02 DIAGNOSIS — Z91013 Allergy to seafood: Secondary | ICD-10-CM | POA: Diagnosis not present

## 2018-05-02 DIAGNOSIS — Z7901 Long term (current) use of anticoagulants: Secondary | ICD-10-CM | POA: Insufficient documentation

## 2018-05-02 HISTORY — PX: MASS EXCISION: SHX2000

## 2018-05-02 SURGERY — EXCISION MASS
Anesthesia: Monitor Anesthesia Care | Site: Arm Upper | Laterality: Right

## 2018-05-02 MED ORDER — PROMETHAZINE HCL 25 MG/ML IJ SOLN: 6.2500 mg | INTRAMUSCULAR | Status: DC | PRN

## 2018-05-02 MED ORDER — LIDOCAINE HCL (CARDIAC) PF 100 MG/5ML IV SOSY
PREFILLED_SYRINGE | INTRAVENOUS | Status: AC
  Filled 2018-05-02: qty 5

## 2018-05-02 MED ORDER — OXYCODONE HCL 5 MG PO TABS: 5.0000 mg | ORAL_TABLET | Freq: Once | ORAL | Status: DC | PRN

## 2018-05-02 MED ORDER — CEFAZOLIN SODIUM-DEXTROSE 2-4 GM/100ML-% IV SOLN
2.0000 g | INTRAVENOUS | Status: AC
  Administered 2018-05-02: 2 g via INTRAVENOUS

## 2018-05-02 MED ORDER — ONDANSETRON HCL 4 MG/2ML IJ SOLN
INTRAMUSCULAR | Status: AC
  Filled 2018-05-02: qty 2

## 2018-05-02 MED ORDER — PROPOFOL 10 MG/ML IV BOLUS
INTRAVENOUS | Status: AC
  Filled 2018-05-02: qty 20

## 2018-05-02 MED ORDER — ONDANSETRON HCL 4 MG/2ML IJ SOLN
INTRAMUSCULAR | Status: DC | PRN
  Administered 2018-05-02: 4 mg via INTRAVENOUS

## 2018-05-02 MED ORDER — MEPERIDINE HCL 25 MG/ML IJ SOLN: 6.2500 mg | INTRAMUSCULAR | Status: DC | PRN

## 2018-05-02 MED ORDER — CHLORHEXIDINE GLUCONATE CLOTH 2 % EX PADS: 6.0000 | MEDICATED_PAD | Freq: Once | CUTANEOUS | Status: DC

## 2018-05-02 MED ORDER — HYDROMORPHONE HCL 1 MG/ML IJ SOLN
0.2500 mg | INTRAMUSCULAR | Status: DC | PRN
  Administered 2018-05-02 (×2): 0.5 mg via INTRAVENOUS

## 2018-05-02 MED ORDER — BUPIVACAINE HCL (PF) 0.5 % IJ SOLN
INTRAMUSCULAR | Status: AC
  Filled 2018-05-02: qty 30

## 2018-05-02 MED ORDER — LACTATED RINGERS IV SOLN: INTRAVENOUS | Status: DC

## 2018-05-02 MED ORDER — LACTATED RINGERS IV SOLN
INTRAVENOUS | Status: DC
  Administered 2018-05-02: 10:00:00 via INTRAVENOUS

## 2018-05-02 MED ORDER — PROPOFOL 500 MG/50ML IV EMUL
INTRAVENOUS | Status: DC | PRN
  Administered 2018-05-02: 75 ug/kg/min via INTRAVENOUS

## 2018-05-02 MED ORDER — MIDAZOLAM HCL 2 MG/2ML IJ SOLN
INTRAMUSCULAR | Status: AC
Start: 2018-05-02 — End: ?
  Filled 2018-05-02: qty 2

## 2018-05-02 MED ORDER — LIDOCAINE 2% (20 MG/ML) 5 ML SYRINGE
INTRAMUSCULAR | Status: DC | PRN
  Administered 2018-05-02: 50 mg via INTRAVENOUS

## 2018-05-02 MED ORDER — SCOPOLAMINE 1 MG/3DAYS TD PT72: 1.0000 | MEDICATED_PATCH | Freq: Once | TRANSDERMAL | Status: DC | PRN

## 2018-05-02 MED ORDER — FENTANYL CITRATE (PF) 100 MCG/2ML IJ SOLN
50.0000 ug | INTRAMUSCULAR | Status: DC | PRN
  Administered 2018-05-02 (×2): 50 ug via INTRAVENOUS

## 2018-05-02 MED ORDER — TRAMADOL HCL 50 MG PO TABS: 50.0000 mg | ORAL_TABLET | Freq: Four times a day (QID) | ORAL | 0 refills | Status: DC | PRN

## 2018-05-02 MED ORDER — MIDAZOLAM HCL 2 MG/2ML IJ SOLN
1.0000 mg | INTRAMUSCULAR | Status: DC | PRN
  Administered 2018-05-02: 2 mg via INTRAVENOUS

## 2018-05-02 MED ORDER — FENTANYL CITRATE (PF) 100 MCG/2ML IJ SOLN
INTRAMUSCULAR | Status: AC
  Filled 2018-05-02: qty 2

## 2018-05-02 MED ORDER — BUPIVACAINE HCL (PF) 0.5 % IJ SOLN
INTRAMUSCULAR | Status: DC | PRN
  Administered 2018-05-02: 10 mL

## 2018-05-02 MED ORDER — HYDROMORPHONE HCL 1 MG/ML IJ SOLN
INTRAMUSCULAR | Status: AC
  Filled 2018-05-02: qty 0.5

## 2018-05-02 MED ORDER — OXYCODONE HCL 5 MG/5ML PO SOLN: 5.0000 mg | Freq: Once | ORAL | Status: DC | PRN

## 2018-05-02 SURGICAL SUPPLY — 38 items
BENZOIN TINCTURE PRP APPL 2/3 (GAUZE/BANDAGES/DRESSINGS) IMPLANT
BLADE SURG 15 STRL LF DISP TIS (BLADE) ×1 IMPLANT
BLADE SURG 15 STRL SS (BLADE) ×1
CHLORAPREP W/TINT 26ML (MISCELLANEOUS) ×2 IMPLANT
CLEANER CAUTERY TIP 5X5 PAD (MISCELLANEOUS) IMPLANT
COVER BACK TABLE 60X90IN (DRAPES) ×2 IMPLANT
COVER MAYO STAND STRL (DRAPES) ×2 IMPLANT
DECANTER SPIKE VIAL GLASS SM (MISCELLANEOUS) IMPLANT
DRAPE LAPAROTOMY 100X72 PEDS (DRAPES) IMPLANT
DRAPE U-SHAPE 76X120 STRL (DRAPES) IMPLANT
DRAPE UTILITY XL STRL (DRAPES) ×2 IMPLANT
DRSG TEGADERM 4X4.75 (GAUZE/BANDAGES/DRESSINGS) IMPLANT
ELECT REM PT RETURN 9FT ADLT (ELECTROSURGICAL) ×2
ELECTRODE REM PT RTRN 9FT ADLT (ELECTROSURGICAL) ×1 IMPLANT
GAUZE SPONGE 4X4 12PLY STRL LF (GAUZE/BANDAGES/DRESSINGS) ×2 IMPLANT
GLOVE BIO SURGEON STRL SZ 6.5 (GLOVE) ×2 IMPLANT
GLOVE BIOGEL PI IND STRL 6.5 (GLOVE) ×1 IMPLANT
GLOVE BIOGEL PI INDICATOR 6.5 (GLOVE) ×1
GLOVE SURG ORTHO 8.0 STRL STRW (GLOVE) ×4 IMPLANT
GOWN STRL REUS W/ TWL LRG LVL3 (GOWN DISPOSABLE) ×1 IMPLANT
GOWN STRL REUS W/ TWL XL LVL3 (GOWN DISPOSABLE) ×1 IMPLANT
GOWN STRL REUS W/TWL LRG LVL3 (GOWN DISPOSABLE) ×1
GOWN STRL REUS W/TWL XL LVL3 (GOWN DISPOSABLE) ×1
NEEDLE HYPO 25X1 1.5 SAFETY (NEEDLE) ×2 IMPLANT
PACK BASIN DAY SURGERY FS (CUSTOM PROCEDURE TRAY) ×2 IMPLANT
PAD CLEANER CAUTERY TIP 5X5 (MISCELLANEOUS)
PENCIL BUTTON HOLSTER BLD 10FT (ELECTRODE) ×2 IMPLANT
SHEET MEDIUM DRAPE 40X70 STRL (DRAPES) IMPLANT
SLEEVE SCD COMPRESS KNEE MED (MISCELLANEOUS) IMPLANT
STRIP CLOSURE SKIN 1/2X4 (GAUZE/BANDAGES/DRESSINGS) ×2 IMPLANT
SUT ETHILON 3 0 PS 1 (SUTURE) IMPLANT
SUT ETHILON 4 0 PS 2 18 (SUTURE) IMPLANT
SUT MNCRL AB 4-0 PS2 18 (SUTURE) IMPLANT
SUT VICRYL 3-0 CR8 SH (SUTURE) ×2 IMPLANT
SUT VICRYL 4-0 PS2 18IN ABS (SUTURE) IMPLANT
SYR CONTROL 10ML LL (SYRINGE) ×2 IMPLANT
TOWEL GREEN STERILE FF (TOWEL DISPOSABLE) ×2 IMPLANT
TOWEL OR NON WOVEN STRL DISP B (DISPOSABLE) IMPLANT

## 2018-05-02 NOTE — Op Note (Signed)
NAME: Jill Escobar, Jill L. MEDICAL RECORD [id number] ACCOUNT [id number] DATE OF BIRTH:1956/06/11 FACILITY: [facility] LOCATION: [facility] PHYSICIAN:Shawntez Dickison Molly MaduroM. Maryem Shuffler, MD  OPERATIVE REPORT  DATE OF PROCEDURE:  05/02/2018  PREOPERATIVE DIAGNOSIS:  Skin lesion, right upper arm.  POSTOPERATIVE DIAGNOSIS:  Skin lesion, right upper arm.  PROCEDURE:  Excision of skin lesion, right upper arm, skin and subcutaneous tissue (1.5 x 1.0 x 1.0 cm).  SURGEON:  Velora Hecklerodd M.  Vyolet Sakuma, MD  ANESTHESIA:  Local with intravenous sedation.  PREPARATION:  ChloraPrep.  COMPLICATIONS:  None.  BLOOD LOSS:  Minimal.  INDICATIONS:  The patient is a 62 year old female who underwent shave biopsy of a lesion of the right upper extremity at the office of her dermatologist.  This was inadequate for excision as the lesion extended into the subcutaneous tissues.  The patient  was referred for surgical excision and submission for definitive diagnosis.  DESCRIPTION OF PROCEDURE:  [facility]  The patient was brought to the operating room and placed in a supine position on the operating room table.  Right upper extremity was positioned and padded.  The  patient was then administered intravenous sedation and then prepped and draped in the usual aseptic fashion.  After ascertaining that an adequate level of sedation had been achieved, the skin around the lesion in the right upper extremity was  anesthetized with local anesthetic.  Using a #15 blade, an elliptical incision was made so as to encompass the entire lesion and the overlying sinus tract.  The lesion was excised into the subcutaneous tissues, and hemostasis was achieved with the  electrocautery.  The entire lesion was excised and submitted to pathology for review.  Hemostasis was achieved with the electrocautery.  Skin was closed with interrupted 3-0 Vicryl dermal sutures.  The wound was washed and dried and Steri-Strips were  applied.  Sterile dressing was applied.  The patient was awakened from anesthesia and  brought to the recovery room.  The patient tolerated the procedure well.  Darnell Levelodd Rayyan Burley, MD [facility] [place] [facility] Office: [phone]    LN/NUANCE  D:05/02/2018 T:05/02/2018 JOB:[id number]

## 2018-05-02 NOTE — Transfer of Care (Signed)
Immediate Anesthesia Transfer of Care Note  Patient: Jill Escobar  Procedure(s) Performed: EXCISION OF SKIN LESION ON RIGHT UPPER ARM (Right Arm Upper)  Patient Location: PACU  Anesthesia Type:MAC  Level of Consciousness: awake, alert  and oriented  Airway & Oxygen Therapy: Patient Spontanous Breathing  Post-op Assessment: Report given to RN and Post -op Vital signs reviewed and stable  Post vital signs: Reviewed and stable  Last Vitals:  Vitals Value Taken Time  BP    Temp    Pulse 64 05/02/2018 11:12 AM  Resp 12 05/02/2018 11:12 AM  SpO2 99 % 05/02/2018 11:12 AM    Last Pain:  Vitals:   05/02/18 0915  TempSrc: Oral  PainSc: 5       Patients Stated Pain Goal: 3 (56/31/49 7026)  Complications: No apparent anesthesia complications

## 2018-05-02 NOTE — Anesthesia Preprocedure Evaluation (Addendum)
Anesthesia Evaluation  Patient identified by MRN, date of birth, ID band Patient awake    Reviewed: Allergy & Precautions, NPO status , Patient's Chart, lab work & pertinent test results  Airway Mallampati: II  TM Distance: >3 FB Neck ROM: Full    Dental  (+) Teeth Intact, Dental Advisory Given   Pulmonary former smoker,    breath sounds clear to auscultation       Cardiovascular hypertension, Pt. on medications + dysrhythmias Atrial Fibrillation  Rhythm:Irregular Rate:Normal     Neuro/Psych negative neurological ROS     GI/Hepatic negative GI ROS, Neg liver ROS,   Endo/Other  negative endocrine ROS  Renal/GU negative Renal ROS     Musculoskeletal  (+) Arthritis , Osteoarthritis,    Abdominal Normal abdominal exam  (+)   Peds  Hematology negative hematology ROS (+)   Anesthesia Other Findings   Reproductive/Obstetrics negative OB ROS                            Lab Results  Component Value Date   WBC 5.1 09/07/2017   HGB 11.5 (L) 09/07/2017   HCT 34.4 (L) 09/07/2017   MCV 95.0 09/07/2017   PLT 244 09/07/2017   Lab Results  Component Value Date   CREATININE 1.03 (H) 04/28/2018   BUN 19 04/28/2018   NA 141 04/28/2018   K 3.8 04/28/2018   CL 103 04/28/2018   CO2 29 04/28/2018   Lab Results  Component Value Date   INR 1.4 (H) 04/10/2015   INR 0.92 04/21/2011   INR 1.70 (H) 12/26/2010     Anesthesia Physical Anesthesia Plan  ASA: III  Anesthesia Plan: MAC   Post-op Pain Management:    Induction: Intravenous  PONV Risk Score and Plan: 4 or greater and Ondansetron, Propofol infusion, Dexamethasone and Midazolam  Airway Management Planned: Simple Face Mask  Additional Equipment: None  Intra-op Plan:   Post-operative Plan:   Informed Consent: I have reviewed the patients History and Physical, chart, labs and discussed the procedure including the risks, benefits and  alternatives for the proposed anesthesia with the patient or authorized representative who has indicated his/her understanding and acceptance.   Dental advisory given  Plan Discussed with: CRNA  Anesthesia Plan Comments:        Anesthesia Quick Evaluation

## 2018-05-02 NOTE — Anesthesia Postprocedure Evaluation (Signed)
Anesthesia Post Note  Patient: Georgette Shell  Procedure(s) Performed: EXCISION OF SKIN LESION ON RIGHT UPPER ARM (Right Arm Upper)     Patient location during evaluation: PACU Anesthesia Type: MAC Level of consciousness: awake and alert Pain management: pain level controlled Vital Signs Assessment: post-procedure vital signs reviewed and stable Respiratory status: spontaneous breathing, nonlabored ventilation, respiratory function stable and patient connected to nasal cannula oxygen Cardiovascular status: stable and blood pressure returned to baseline Postop Assessment: no apparent nausea or vomiting Anesthetic complications: no    Last Vitals:  Vitals:   05/02/18 1130 05/02/18 1201  BP: (!) 144/86 (!) 177/95  Pulse: (!) 59 67  Resp: 15 18  Temp:  37.1 C  SpO2: 100% 99%    Last Pain:  Vitals:   05/02/18 1201  TempSrc:   PainSc: 0-No pain                 Effie Berkshire

## 2018-05-02 NOTE — H&P (Signed)
General Surgery [facility]  Jill Escobar DOB: 07-18-1956 Married / Language: English / Race: Black or African American Female   History of Present Illness  The patient is Jill 62 year old female who presents with Jill skin neoplasm.  CHIEF COMPLAINT: skin neoplasm right upper arm  Patient is referred by her dermatologist, Dr. June Leap, for surgical evaluation and management of Jill neoplasm involving the skin and subcutaneous tissues of the right upper arm. This has been present for Jill number of years. She has undergone previous office procedures by her dermatologist but the lesion has persisted. It does cause her some discomfort. She has had some swelling in the area. She is now referred for definitive surgical excision. Patient has no other such lesions present. She works in the [employer] and does do Jill moderate amount of lifting throughout her workday.   Allergies  No Known Drug Allergies [04/17/2018]: Allergies Reconciled   Medication History AmLODIPine Besylate (5MG  Tablet, Oral) Active. Telmisartan-HCTZ (40-12.5MG  Tablet, Oral) Active. DilTIAZem HCl (30MG  Tablet, Oral) Active. Rivaroxaban (10MG  Tablet, Oral) Active. Medications Reconciled  Vitals Weight: 202 lb Height: 67in Body Surface Area: 2.03 m Body Mass Index: 31.64 kg/m  Temp.: 98.29F(Oral)  Pulse: 88 (Regular)  BP: 132/84 (Sitting, Left Arm, Standard)   Physical Exam  See vital signs recorded above  GENERAL APPEARANCE Development: normal Nutritional status: normal Gross deformities: none  SKIN Rash, lesions, ulcers: none Induration, erythema: none Nodules: none palpable In the right upper arm overlying the deltoid muscle laterally is Jill 1.5 x 1 by less than 1 cm mass involving the skin and subcutaneous tissues. There is scarring from an old surgical incision. There is Jill central opening with dark material present consistent with Jill sinus tract. There is  no sign of infection. There is mild tenderness to palpation.  EYES Conjunctiva and lids: normal Pupils: equal and reactive Iris: normal bilaterally  EARS, NOSE, MOUTH, THROAT External ears: no lesion or deformity External nose: no lesion or deformity Hearing: grossly normal Lips: no lesion or deformity Dentition: normal for age Oral mucosa: moist  NECK Symmetric: yes Trachea: midline Thyroid: no palpable nodules in the thyroid bed  CHEST Respiratory effort: normal Retraction or accessory muscle use: no Breath sounds: normal bilaterally Rales, rhonchi, wheeze: none  CARDIOVASCULAR Auscultation: regular rhythm, normal rate Murmurs: none Pulses: carotid and radial pulse 2+ palpable Lower extremity edema: none Lower extremity varicosities: none  MUSCULOSKELETAL Station and gait: normal Digits and nails: no clubbing or cyanosis Muscle strength: grossly normal all extremities Range of motion: grossly normal all extremities Deformity: none  LYMPHATIC Cervical: none palpable Supraclavicular: none palpable  PSYCHIATRIC Oriented to person, place, and time: yes Mood and affect: normal for situation Judgment and insight: appropriate for situation    Assessment & Plan  SKIN LESION OF UPPER EXTREMITY (L98.9)   Patient is referred by her dermatologist, Dr. June Leap, for evaluation of Jill lesion involving the skin of the right upper arm. There is approximately Jill 1.5 cm mass involving the skin and subcutaneous tissues. Previous attempts at surgical management have been unsuccessful. Patient now presents for definitive surgical excision both for management and definitive diagnosis.  Patient discussed the size and location of the surgical incision. Patient would like to have this done under sedation. Patient does have Jill history of atrial fibrillation and has previously undergone ablation. She takes anticoagulation. She will need to stop this for 3 days prior to the  procedure. This can be done  as an outpatient procedure. We'll make arrangements for surgery at Jill time convenient for the patient in the near future.  The risks and benefits of the procedure have been discussed at length with the patient. The patient understands the proposed procedure, potential alternative treatments, and the course of recovery to be expected. All of the patient's questions have been answered at this time. The patient wishes to proceed with surgery.   Darnell Levelodd Altheia Shafran, MD [facility]  [facility] Office: [phone]

## 2018-05-02 NOTE — Brief Op Note (Signed)
05/02/2018  11:10 AM  PATIENT:  Jana Halfindy L Fluharty  62 y.o. female  PRE-OPERATIVE DIAGNOSIS:  Skin lesion on right upper arm  POST-OPERATIVE DIAGNOSIS:  Skin lesion on right upper arm  PROCEDURE:  Procedure(s): EXCISION OF SKIN LESION ON RIGHT UPPER ARM (Right)  SURGEON:  Surgeon(s) and Role:    Darnell Level* Sayra Frisby, MD - Primary  ANESTHESIA:   IV sedation  EBL:  minimal  BLOOD ADMINISTERED:none  DRAINS: none   LOCAL MEDICATIONS USED:  MARCAINE     SPECIMEN:  Excision  DISPOSITION OF SPECIMEN:  PATHOLOGY  COUNTS:  YES  TOURNIQUET:  * No tourniquets in log *  DICTATION: .Other Dictation: Dictation Number [id number]  PLAN OF CARE: Discharge to home after PACU  PATIENT DISPOSITION:  PACU - hemodynamically stable.   Delay start of Pharmacological VTE agent (>24hrs) due to surgical blood loss or risk of bleeding: yes  Darnell Levelodd Eydie Wormley, MD [facility] Office: [phone]

## 2018-05-02 NOTE — Discharge Instructions (Signed)

## 2018-05-02 NOTE — Interval H&P Note (Signed)
History and Physical Interval Note:  05/02/2018 10:19 AM  Jill Escobar  has presented today for surgery, with the diagnosis of Skin lesion on right upper arm  The various methods of treatment have been discussed with the patient and family. After consideration of risks, benefits and other options for treatment, the patient has consented to  Procedure(s): EXCISION OF SKIN LESION ON RIGHT UPPER ARM (Right) as a surgical intervention .  The patient's history has been reviewed, patient examined, no change in status, stable for surgery.  I have reviewed the patient's chart and labs.  Questions were answered to the patient's satisfaction.    Darnell Levelodd Khyson Sebesta, MD [facility] [place] [facility] Office: [phone]    Kenzlee Fishburn MJudie Petit

## 2018-05-03 ENCOUNTER — Encounter (HOSPITAL_BASED_OUTPATIENT_CLINIC_OR_DEPARTMENT_OTHER): Payer: Self-pay | Admitting: Surgery

## 2018-05-10 ENCOUNTER — Telehealth: Payer: Self-pay | Admitting: Cardiology

## 2018-05-10 ENCOUNTER — Ambulatory Visit (INDEPENDENT_AMBULATORY_CARE_PROVIDER_SITE_OTHER): Payer: Federal, State, Local not specified - PPO | Admitting: *Deleted

## 2018-05-10 DIAGNOSIS — I480 Paroxysmal atrial fibrillation: Secondary | ICD-10-CM

## 2018-05-10 NOTE — Telephone Encounter (Signed)
LMOVM requesting that pt send manual transmission b/c home monitor has not updated in at least 14 days.    

## 2018-05-11 NOTE — Progress Notes (Signed)
Carelink Summary Report / Loop Recorder 

## 2018-05-15 LAB — CUP PACEART REMOTE DEVICE CHECK
Date Time Interrogation Session: 20190608024108
MDC IDC PG IMPLANT DT: 20181127

## 2018-05-17 ENCOUNTER — Other Ambulatory Visit (HOSPITAL_COMMUNITY): Payer: Self-pay | Admitting: Nurse Practitioner

## 2018-06-13 ENCOUNTER — Ambulatory Visit (INDEPENDENT_AMBULATORY_CARE_PROVIDER_SITE_OTHER): Payer: Federal, State, Local not specified - PPO | Admitting: *Deleted

## 2018-06-13 DIAGNOSIS — I480 Paroxysmal atrial fibrillation: Secondary | ICD-10-CM | POA: Diagnosis not present

## 2018-06-13 NOTE — Progress Notes (Signed)
Carelink Summary Report / Loop Recorder 

## 2018-06-15 LAB — CUP PACEART REMOTE DEVICE CHECK
Implantable Pulse Generator Implant Date: 20181127
MDC IDC SESS DTM: 20190711064008

## 2018-07-05 ENCOUNTER — Telehealth: Payer: Self-pay | Admitting: Cardiology

## 2018-07-05 NOTE — Telephone Encounter (Signed)
Spoke w/ pt and requested that she send a manual transmission b/c her home monitor has not updated in at least 14 days.   

## 2018-07-17 ENCOUNTER — Ambulatory Visit (INDEPENDENT_AMBULATORY_CARE_PROVIDER_SITE_OTHER): Payer: Federal, State, Local not specified - PPO | Admitting: *Deleted

## 2018-07-17 DIAGNOSIS — I480 Paroxysmal atrial fibrillation: Secondary | ICD-10-CM

## 2018-07-17 NOTE — Progress Notes (Signed)
Carelink Summary Report / Loop Recorder 

## 2018-07-20 LAB — CUP PACEART REMOTE DEVICE CHECK
Date Time Interrogation Session: 20190813104147
MDC IDC PG IMPLANT DT: 20181127

## 2018-07-30 LAB — CUP PACEART REMOTE DEVICE CHECK
Implantable Pulse Generator Implant Date: 20181127
MDC IDC SESS DTM: 20190915114237

## 2018-08-18 ENCOUNTER — Ambulatory Visit (INDEPENDENT_AMBULATORY_CARE_PROVIDER_SITE_OTHER): Payer: Federal, State, Local not specified - PPO | Admitting: *Deleted

## 2018-08-18 DIAGNOSIS — I480 Paroxysmal atrial fibrillation: Secondary | ICD-10-CM

## 2018-08-18 DIAGNOSIS — R002 Palpitations: Secondary | ICD-10-CM

## 2018-08-18 NOTE — Progress Notes (Signed)
Carelink Summary Report / Loop Recorder 

## 2018-08-28 ENCOUNTER — Other Ambulatory Visit (HOSPITAL_COMMUNITY): Payer: Self-pay | Admitting: Internal Medicine

## 2018-09-05 LAB — CUP PACEART REMOTE DEVICE CHECK
Date Time Interrogation Session: 20191018113553
MDC IDC PG IMPLANT DT: 20181127

## 2018-09-20 ENCOUNTER — Ambulatory Visit (INDEPENDENT_AMBULATORY_CARE_PROVIDER_SITE_OTHER): Payer: Federal, State, Local not specified - PPO

## 2018-09-20 DIAGNOSIS — I480 Paroxysmal atrial fibrillation: Secondary | ICD-10-CM | POA: Diagnosis not present

## 2018-09-21 NOTE — Progress Notes (Signed)
Carelink Summary Report / Loop Recorder 

## 2018-10-05 ENCOUNTER — Other Ambulatory Visit (HOSPITAL_COMMUNITY): Payer: Self-pay | Admitting: Internal Medicine

## 2018-10-13 ENCOUNTER — Encounter: Payer: Self-pay | Admitting: Internal Medicine

## 2018-10-16 ENCOUNTER — Ambulatory Visit: Payer: Federal, State, Local not specified - PPO | Admitting: Internal Medicine

## 2018-10-16 ENCOUNTER — Encounter: Payer: Self-pay | Admitting: Internal Medicine

## 2018-10-16 VITALS — BP 140/90 | HR 90 | Ht 69.0 in | Wt 200.0 lb

## 2018-10-16 DIAGNOSIS — I10 Essential (primary) hypertension: Secondary | ICD-10-CM

## 2018-10-16 DIAGNOSIS — I480 Paroxysmal atrial fibrillation: Secondary | ICD-10-CM | POA: Diagnosis not present

## 2018-10-16 MED ORDER — BENZONATATE 100 MG PO CAPS: 100.0000 mg | ORAL_CAPSULE | Freq: Three times a day (TID) | ORAL | 1 refills | Status: DC | PRN

## 2018-10-16 MED ORDER — TELMISARTAN-HCTZ 80-25 MG PO TABS: 1.0000 | ORAL_TABLET | Freq: Every day | ORAL | 3 refills | Status: DC

## 2018-10-16 MED ORDER — AMLODIPINE BESYLATE 2.5 MG PO TABS: 2.5000 mg | ORAL_TABLET | Freq: Every day | ORAL | 3 refills | Status: DC

## 2018-10-16 MED ORDER — DILTIAZEM HCL ER BEADS 180 MG PO CP24: 180.0000 mg | ORAL_CAPSULE | Freq: Every day | ORAL | 3 refills | Status: DC

## 2018-10-16 NOTE — Progress Notes (Signed)
PCP: Georgianne Fickamachandran, Ajith, MD Primary Cardiologist: Dr Anne FuSkains Primary EP: Dr Johney FrameAllred  Jill Escobar is a 62 y.o. female who presents today for routine electrophysiology followup.  Since last being seen in our clinic, the patient reports doing very well.  Today, she denies symptoms of palpitations, chest pain, shortness of breath,  lower extremity edema, dizziness, presyncope, or syncope.  She has URI symptoms of cough today.  The patient is otherwise without complaint today.   Past Medical History:  Diagnosis Date  . Atrial fibrillation (HCC)   . HTN (hypertension)   . Hypokalemia   . Osteoarthritis    L hip  . Overweight    Past Surgical History:  Procedure Laterality Date  . CARDIAC ELECTROPHYSIOLOGY STUDY AND ABLATION    . D&C and removal of endonetrial polyp    . drainage of cyst     follicular functional-type of cysyt- L ovary   . ELECTROPHYSIOLOGIC STUDY N/A 06/17/2015   Procedure: Atrial Fibrillation Ablation;  Surgeon: Hillis RangeJames Damichael Hofman, MD;  [facility];  Service: Cardiovascular;  Laterality: N/A;  . JOINT REPLACEMENT Left 2012   hip  . LAPAROSCOPY     with lysis of pelvic adhesions   . LOOP RECORDER INSERTION N/A 09/27/2017   Procedure: LOOP RECORDER INSERTION;  Surgeon: Hillis RangeAllred, Momin Misko, MD;  Location: [facility] INVASIVE CV LAB;  Service: Cardiovascular;  Laterality: N/A;  . MASS EXCISION Right 05/02/2018   Procedure: EXCISION OF SKIN LESION ON RIGHT UPPER ARM;  Surgeon: Darnell LevelGerkin, Todd, MD;  Location: [facility];  Service: General;  Laterality: Right;  . NASAL ENDOSCOPY     upper  . TEE WITHOUT CARDIOVERSION N/A 04/14/2015   Procedure: TRANSESOPHAGEAL ECHOCARDIOGRAM (TEE);  Surgeon: Jake BatheMark C Skains, MD;  [facility];  Service: Cardiovascular;  Laterality: N/A;  . TEE WITHOUT CARDIOVERSION N/A 06/17/2015   Procedure: TRANSESOPHAGEAL ECHOCARDIOGRAM (TEE);  Surgeon: Chilton Siiffany Copeland, MD;  [facility];  Service: Cardiovascular;  Laterality:  N/A;  . uterosacral nerve ablation     with bipolar coagulation. Surgeon Dr. Jennette KettleNeal  . VESICOVAGINAL FISTULA CLOSURE W/ TAH      ROS- all systems are reviewed and negatives except as per HPI above  Current Outpatient Medications  Medication Sig Dispense Refill  . acetaminophen (TYLENOL) 500 MG tablet Take 1,000 mg by mouth at bedtime as needed (pain).    Marland Kitchen. amLODipine (NORVASC) 2.5 MG tablet Take 1 tablet (2.5 mg total) by mouth daily. Please call and schedule an appointment for further refills 1st attempt 90 tablet 0  . diltiazem (TIAZAC) 180 MG 24 hr capsule Take 180 mg by mouth daily.    Marland Kitchen. doxylamine, Sleep, (UNISOM) 25 MG tablet Take 25 mg by mouth at bedtime as needed for sleep.    . Multiple Vitamin (MULTIVITAMIN WITH MINERALS) TABS tablet Take 1 tablet by mouth daily.    Marland Kitchen. telmisartan-hydrochlorothiazide (MICARDIS HCT) 80-25 MG tablet Take 1 tablet by mouth daily. Please make yearly appt with Dr. Johney FrameAllred for January before anymore refills. 1st attempt 30 tablet 0  . traMADol (ULTRAM) 50 MG tablet Take 1-2 tablets (50-100 mg total) by mouth every 6 (six) hours as needed for moderate pain. 12 tablet 0  . XARELTO 20 MG TABS tablet TAKE 1 TABLET BY MOUTH ONCE DAILY WITH  DINNER 90 tablet 1   No current facility-administered medications for this visit.     Physical Exam: Vitals:   10/16/18 1026  BP: 140/90  Pulse: 90  SpO2: 98%  Weight: 200 lb (90.7 kg)  Height: 5\' 9"  (1.753 m)    GEN- The patient is well appearing, alert and oriented x 3 today.   Head- normocephalic, atraumatic Eyes-  Sclera clear, conjunctiva pink Ears- hearing intact Oropharynx- clear Lungs- Clear to ausculation bilaterally, normal work of breathing Heart- Regular rate and rhythm, no murmurs, rubs or gallops, PMI not laterally displaced GI- soft, NT, ND, + BS Extremities- no clubbing, cyanosis, or edema  Wt Readings from Last 3 Encounters:  10/16/18 200 lb (90.7 kg)  05/02/18 203 lb 9.6 oz (92.4 kg)    11/28/17 200 lb (90.7 kg)    EKG tracing ordered today is personally reviewed and shows sinus rhythm 90 bpm, PR 146 msec, QRS 78 msec, QTc 455 msec  Assessment and Plan:  1. Paroxysmal atrial fibrillation ILR reveals AF burden <0.1%, off AAD therapy post ablation  chads2vasc score is 2.  On xarelto As per recent AF guidline update, we will stop anticoagulation  2. HTN Stable No change required today  3. URI No indication for antibiotics today Supportive care Tessalon pearls  carelink Return in a year  Hillis Range MD, [facility] 10/16/2018 10:46 AM

## 2018-10-16 NOTE — Patient Instructions (Addendum)
Medication Instructions:  Your physician has recommended you make the following change in your medication:   1.  Stop taking Xarelto  2.  Start taking Tessalon Perles 100 mg capsultes- One capsule by mouth 3 times a day as needed for cough  Labwork: None ordered.  Testing/Procedures: None ordered.  Follow-Up: Your physician wants you to follow-up in: one year with Dr. Johney FrameAllred.   You will receive a reminder letter in the mail two months in advance. If you don't receive a letter, please call our office to schedule the follow-up appointment.  Any Other Special Instructions Will Be Listed Below (If Applicable).  If you need a refill on your cardiac medications before your next appointment, please call your pharmacy.

## 2018-10-16 NOTE — Addendum Note (Signed)
Addended by: Roney MansSMITH, Geraldene Eisel A on: 10/16/2018 11:00 AM   Modules accepted: Orders

## 2018-10-19 LAB — CUP PACEART INCLINIC DEVICE CHECK
Date Time Interrogation Session: 20191216154032
MDC IDC PG IMPLANT DT: 20181127

## 2018-10-23 ENCOUNTER — Ambulatory Visit (INDEPENDENT_AMBULATORY_CARE_PROVIDER_SITE_OTHER): Payer: Federal, State, Local not specified - PPO

## 2018-10-23 DIAGNOSIS — I480 Paroxysmal atrial fibrillation: Secondary | ICD-10-CM

## 2018-10-23 LAB — CUP PACEART REMOTE DEVICE CHECK
Implantable Pulse Generator Implant Date: 20181127
MDC IDC SESS DTM: 20191223134140

## 2018-10-24 NOTE — Progress Notes (Signed)
Carelink Summary Report / Loop Recorder 

## 2018-11-05 LAB — CUP PACEART REMOTE DEVICE CHECK
MDC IDC PG IMPLANT DT: 20181127
MDC IDC SESS DTM: 20191120123841

## 2018-11-22 ENCOUNTER — Other Ambulatory Visit (HOSPITAL_COMMUNITY): Payer: Self-pay | Admitting: Internal Medicine

## 2018-11-22 DIAGNOSIS — I10 Essential (primary) hypertension: Secondary | ICD-10-CM

## 2018-11-22 DIAGNOSIS — I480 Paroxysmal atrial fibrillation: Secondary | ICD-10-CM

## 2018-11-22 MED ORDER — DILTIAZEM HCL ER BEADS 180 MG PO CP24: 180.0000 mg | ORAL_CAPSULE | Freq: Every day | ORAL | 3 refills | Status: DC

## 2018-11-22 MED ORDER — AMLODIPINE BESYLATE 2.5 MG PO TABS: 2.5000 mg | ORAL_TABLET | Freq: Every day | ORAL | 3 refills | Status: DC

## 2018-11-22 NOTE — Telephone Encounter (Signed)
Pt's medications were sent to pt's pharmacy as requested. Confirmation received.  

## 2018-11-27 ENCOUNTER — Ambulatory Visit (INDEPENDENT_AMBULATORY_CARE_PROVIDER_SITE_OTHER): Payer: Federal, State, Local not specified - PPO

## 2018-11-27 DIAGNOSIS — I480 Paroxysmal atrial fibrillation: Secondary | ICD-10-CM

## 2018-11-27 DIAGNOSIS — R002 Palpitations: Secondary | ICD-10-CM

## 2018-11-28 LAB — CUP PACEART REMOTE DEVICE CHECK
Date Time Interrogation Session: 20200125133812
Implantable Pulse Generator Implant Date: 20181127

## 2018-11-28 NOTE — Progress Notes (Signed)
Carelink Summary Report / Loop Recorder 

## 2018-12-28 ENCOUNTER — Ambulatory Visit (INDEPENDENT_AMBULATORY_CARE_PROVIDER_SITE_OTHER): Payer: Federal, State, Local not specified - PPO | Admitting: *Deleted

## 2018-12-28 DIAGNOSIS — I480 Paroxysmal atrial fibrillation: Secondary | ICD-10-CM | POA: Diagnosis not present

## 2018-12-29 LAB — CUP PACEART REMOTE DEVICE CHECK
Date Time Interrogation Session: 20200227133907
Implantable Pulse Generator Implant Date: 20181127

## 2019-01-03 NOTE — Progress Notes (Signed)
Carelink Summary Report / Loop Recorder 

## 2019-01-22 ENCOUNTER — Telehealth: Payer: Self-pay | Admitting: Internal Medicine

## 2019-01-22 NOTE — Telephone Encounter (Signed)
New Message:.    Pt says she work at [employer]. She has Afib and high blood pressure. She wants to know if she will a high risk if she goes to work? If she is, she would like a note for work please.

## 2019-01-23 NOTE — Telephone Encounter (Signed)
Returned patient's call. As discussed with Dr. Johney Frame right now patient is not considered High Risk per CDC and Health Department Protocol with resent COVID19 circulating.  She is able to go to work.  Patient verbally agreed, and thanks [place] for the call.

## 2019-01-29 ENCOUNTER — Telehealth: Payer: Self-pay | Admitting: Internal Medicine

## 2019-01-29 MED ORDER — BENZONATATE 100 MG PO CAPS: 100.0000 mg | ORAL_CAPSULE | Freq: Three times a day (TID) | ORAL | 1 refills | Status: DC | PRN

## 2019-01-29 NOTE — Telephone Encounter (Signed)
Prescription refilled as requested.

## 2019-01-29 NOTE — Telephone Encounter (Signed)
Pt calling requesting a refill on benzonatate. Pt complaining of a cough and would like Dr. Johney Frame to refill this medication. Please address

## 2019-01-30 ENCOUNTER — Other Ambulatory Visit: Payer: Self-pay

## 2019-01-30 ENCOUNTER — Ambulatory Visit (INDEPENDENT_AMBULATORY_CARE_PROVIDER_SITE_OTHER): Payer: Federal, State, Local not specified - PPO | Admitting: *Deleted

## 2019-01-30 DIAGNOSIS — I480 Paroxysmal atrial fibrillation: Secondary | ICD-10-CM

## 2019-01-30 LAB — CUP PACEART REMOTE DEVICE CHECK
Date Time Interrogation Session: 20200331143826
Implantable Pulse Generator Implant Date: 20181127

## 2019-02-06 NOTE — Progress Notes (Signed)
Carelink Summary Report / Loop Recorder 

## 2019-03-05 ENCOUNTER — Ambulatory Visit (INDEPENDENT_AMBULATORY_CARE_PROVIDER_SITE_OTHER): Payer: Federal, State, Local not specified - PPO | Admitting: *Deleted

## 2019-03-05 ENCOUNTER — Other Ambulatory Visit: Payer: Self-pay

## 2019-03-05 DIAGNOSIS — I480 Paroxysmal atrial fibrillation: Secondary | ICD-10-CM

## 2019-03-05 LAB — CUP PACEART REMOTE DEVICE CHECK
Date Time Interrogation Session: 20200503144131
Implantable Pulse Generator Implant Date: 20181127

## 2019-03-12 NOTE — Progress Notes (Signed)
Carelink Summary Report / Loop Recorder 

## 2019-04-06 ENCOUNTER — Ambulatory Visit (INDEPENDENT_AMBULATORY_CARE_PROVIDER_SITE_OTHER): Payer: Federal, State, Local not specified - PPO | Admitting: *Deleted

## 2019-04-06 DIAGNOSIS — I480 Paroxysmal atrial fibrillation: Secondary | ICD-10-CM

## 2019-04-08 LAB — CUP PACEART REMOTE DEVICE CHECK
Date Time Interrogation Session: 20200605154039
Implantable Pulse Generator Implant Date: 20181127

## 2019-04-13 NOTE — Progress Notes (Signed)
Carelink Summary Report / Loop Recorder 

## 2019-05-09 ENCOUNTER — Ambulatory Visit (INDEPENDENT_AMBULATORY_CARE_PROVIDER_SITE_OTHER): Payer: Federal, State, Local not specified - PPO | Admitting: *Deleted

## 2019-05-09 DIAGNOSIS — I480 Paroxysmal atrial fibrillation: Secondary | ICD-10-CM | POA: Diagnosis not present

## 2019-05-09 LAB — CUP PACEART REMOTE DEVICE CHECK
Date Time Interrogation Session: 20200708161131
Implantable Pulse Generator Implant Date: 20181127

## 2019-05-21 NOTE — Progress Notes (Signed)
Carelink Summary Report / Loop Recorder 

## 2019-05-30 ENCOUNTER — Telehealth: Payer: Self-pay | Admitting: Internal Medicine

## 2019-05-30 NOTE — Telephone Encounter (Signed)
LMOM for pt to call clinic.   Alert received for  5 episodes of  questionable AF from 7/22- 05/28/19. Longest episode lasted 4 hrs and 32 min and the highest v rate was 176 bpm.     .

## 2019-05-30 NOTE — Telephone Encounter (Signed)
Spoke with patient and she reports feeling her heart racing intermittently since 05/23/19. Pt states she has not felt well and has been dizzy intermittently over the past week.  No CP or chest pressure.  Increased SHOB with activity.She has not missed any doses of diltiazem. C/o polyuria X 1 week.   Pt encouraged to contact PCP about polyuria. Aware Dr Rayann Heman will review her chart concerning Tachycardia and related sx. ED precautions given.

## 2019-05-30 NOTE — Telephone Encounter (Signed)
Call placed to Pt.  Advised Dr. Rayann Heman would like Pt to be seen in the afib clinic tomorrow 05/31/2019 at 9:00 am.  Asked Pt to call afib clinic to confirm.

## 2019-05-31 ENCOUNTER — Other Ambulatory Visit: Payer: Self-pay

## 2019-05-31 ENCOUNTER — Ambulatory Visit (HOSPITAL_COMMUNITY)
Admission: RE | Admit: 2019-05-31 | Discharge: 2019-05-31 | Disposition: A | Discharge status: Home / Self Care | Payer: Federal, State, Local not specified - PPO | Source: Ambulatory Visit | Attending: Nurse Practitioner | Admitting: Nurse Practitioner

## 2019-05-31 ENCOUNTER — Encounter (HOSPITAL_COMMUNITY): Payer: Self-pay | Admitting: Nurse Practitioner

## 2019-05-31 VITALS — BP 138/84 | HR 66 | Ht 69.0 in | Wt 205.0 lb

## 2019-05-31 DIAGNOSIS — Z87891 Personal history of nicotine dependence: Secondary | ICD-10-CM | POA: Diagnosis not present

## 2019-05-31 DIAGNOSIS — E876 Hypokalemia: Secondary | ICD-10-CM | POA: Insufficient documentation

## 2019-05-31 DIAGNOSIS — Z79899 Other long term (current) drug therapy: Secondary | ICD-10-CM | POA: Insufficient documentation

## 2019-05-31 DIAGNOSIS — I480 Paroxysmal atrial fibrillation: Secondary | ICD-10-CM | POA: Diagnosis not present

## 2019-05-31 DIAGNOSIS — I4891 Unspecified atrial fibrillation: Secondary | ICD-10-CM | POA: Diagnosis not present

## 2019-05-31 DIAGNOSIS — I10 Essential (primary) hypertension: Secondary | ICD-10-CM | POA: Diagnosis not present

## 2019-05-31 MED ORDER — DILTIAZEM HCL 30 MG PO TABS: ORAL_TABLET | ORAL | 1 refills | Status: DC

## 2019-05-31 MED ORDER — DILTIAZEM HCL ER COATED BEADS 240 MG PO CP24: 240.0000 mg | ORAL_CAPSULE | Freq: Every day | ORAL | 3 refills | Status: DC

## 2019-05-31 NOTE — Patient Instructions (Signed)
Stop amlodipine  Increase cardizem to 240mg  once a day

## 2019-05-31 NOTE — Progress Notes (Signed)
Patient ID: Jill Escobar, female   DOB: [birth year], 63 y.o.   MRN: [id number]     Primary Care Physician: Merrilee Seashore, MD Referring Physician: Dr. Phillip Heal is a 63 y.o. female with a h/o PAF, s/p afib ablation  06/2015, that I have not seen in some time.She has noted increase of afib which has also been documented by her Linq.  Episodes have been between 7/22 and 7/27, 5 episodes noted. Longest episode has been 4 hours, 32 mins with highest v rate 176 bpm. She denies any change in health. 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  She denies alcohol, extra caffeine or decongestants. She states that she had a sleep study in 2015 but never heard the results. She does snore, unsure of apnea spells as husband snores just as bad. She admits to some weight gain.  Today, she denies symptoms of palpitations, chest pain, shortness of breath, orthopnea, PND, lower extremity edema, dizziness, presyncope, syncope, or neurologic sequela. The patient is tolerating medications without difficulties and is otherwise without complaint today.   Past Medical History:  Diagnosis Date  . Atrial fibrillation ([place])   . HTN (hypertension)   . Hypokalemia   . Osteoarthritis    L hip  . Overweight    Past Surgical History:  Procedure Laterality Date  . CARDIAC ELECTROPHYSIOLOGY STUDY AND ABLATION    . D&C and removal of endonetrial polyp    . drainage of cyst     follicular functional-type of cysyt- L ovary   . ELECTROPHYSIOLOGIC STUDY N/A 06/17/2015   Procedure: Atrial Fibrillation Ablation;  Surgeon: Thompson Grayer, MD;  Location: [facility];  Service: Cardiovascular;  Laterality: N/A;  . JOINT REPLACEMENT Left 2012   hip  . LAPAROSCOPY     with lysis of pelvic adhesions   . LOOP RECORDER INSERTION N/A 09/27/2017   Procedure: LOOP RECORDER INSERTION;  Surgeon: Thompson Grayer, MD;  Location: [facility];  Service:  Cardiovascular;  Laterality: N/A;  . MASS EXCISION Right 05/02/2018   Procedure: EXCISION OF SKIN LESION ON RIGHT UPPER ARM;  Surgeon: Armandina Gemma, MD;  Location: [facility];  Service: General;  Laterality: Right;  . NASAL ENDOSCOPY     upper  . TEE WITHOUT CARDIOVERSION N/A 04/14/2015   Procedure: TRANSESOPHAGEAL ECHOCARDIOGRAM (TEE);  Surgeon: Jerline Pain, MD;  Location: [facility];  Service: Cardiovascular;  Laterality: N/A;  . TEE WITHOUT CARDIOVERSION N/A 06/17/2015   Procedure: TRANSESOPHAGEAL ECHOCARDIOGRAM (TEE);  Surgeon: Skeet Latch, MD;  Location: [facility];  Service: Cardiovascular;  Laterality: N/A;  . uterosacral nerve ablation     with bipolar coagulation. Surgeon Dr. Nori Riis  . VESICOVAGINAL FISTULA CLOSURE W/ TAH      Current Outpatient Medications  Medication Sig Dispense Refill  . acetaminophen (TYLENOL) 500 MG tablet Take 1,000 mg by mouth at bedtime as needed (pain).    Marland Kitchen amLODipine (NORVASC) 2.5 MG tablet Take 1 tablet (2.5 mg total) by mouth daily. 90 tablet 3  . diltiazem (TIAZAC) 180 MG 24 hr capsule Take 1 capsule (180 mg total) by mouth daily. 90 capsule 3  . doxylamine, Sleep, (UNISOM) 25 MG tablet Take 25 mg by mouth at bedtime as needed for sleep.    . Multiple Vitamin (MULTIVITAMIN WITH MINERALS) TABS tablet Take 1 tablet by mouth daily.    Marland Kitchen telmisartan-hydrochlorothiazide (MICARDIS HCT) 80-25 MG tablet Take 1 tablet by  mouth daily. 90 tablet 3   No current facility-administered medications for this encounter.     Allergies  Allergen Reactions  . Shellfish Allergy Hives    Social History   Socioeconomic History  . Marital status: Married    Spouse name: Not on file  . Number of children: Not on file  . Years of education: Not on file  . Highest education level: Not on file  Occupational History  . Not on file  Social Needs  . Financial resource strain: Not on file  . Food insecurity    Worry: Not on file    Inability:  Not on file  . Transportation needs    Medical: Not on file    Non-medical: Not on file  Tobacco Use  . Smoking status: Former Games developermoker  . Smokeless tobacco: Never Used  . Tobacco comment: has a 5 pack year hx. quit 4 years ago.   Substance and Sexual Activity  . Alcohol use: Yes    Comment: occasional   . Drug use: No  . Sexual activity: Not Currently  Lifestyle  . Physical activity    Days per week: Not on file    Minutes per session: Not on file  . Stress: Not on file  Relationships  . Social Musicianconnections    Talks on phone: Not on file    Gets together: Not on file    Attends religious service: Not on file    Active member of club or organization: Not on file    Attends meetings of clubs or organizations: Not on file    Relationship status: Not on file  . Intimate partner violence    Fear of current or ex partner: Not on file    Emotionally abused: Not on file    Physically abused: Not on file    Forced sexual activity: Not on file  Other Topics Concern  . Not on file  Social History Narrative   [place] in [place] her [place]. Has 2 children.    [employer] adviserclerk worker.    Walks her dog approx. 30 min/day. Has no specific diet     Family History  Problem Relation Age of Onset  . Cancer Father   . Other Mother        MVA    ROS- All systems are reviewed and negative except as per the HPI above  Physical Exam: Vitals:   05/31/19 0840  BP: 138/84  Pulse: 66  Weight: 93 kg  Height: 5\' 9"  (1.753 m)    GEN- The patient is well appearing, alert and oriented x 3 today.   Head- normocephalic, atraumatic Eyes-  Sclera clear, conjunctiva pink Ears- hearing intact Oropharynx- clear Neck- supple, no JVP Lymph- no cervical lymphadenopathy Lungs- Clear to ausculation bilaterally, normal work of breathing Heart- Regular rate and rhythm, no murmurs, rubs or gallops, PMI not laterally displaced GI- soft, NT, ND, + BS Extremities- no clubbing, cyanosis, or edema MS-  no significant deformity or atrophy Skin- no rash or lesion Psych- euthymic mood, full affect Neuro- strength and sensation are intact  EKG- SR at  66  bpm, pr int 146 ms, qrs int 80 ms, qtc 454 ms Epic records reviewed  Assessment and Plan: 1. Afib  Pt reports increased afib burden over the last week Will increase cardizem to 240 mg daily Stop amlodipine Will RX 30 mg cardizem to use as needed If this does not reduce afib episodes, discussed use of flecainide 50 mg  bid Sleep  study  Weight loss/exercsie encouraged  Will not rx xarelto as of yet as she has a CHA2DS2VASc score of 2 and does not meet guidelines to take daily   2. HTN Stable  F/u here in  14  days   Lupita LeashDonna C. Matthew Folksarroll, ANP-C [facility] [address] [place], [place] [address] [phone]

## 2019-06-11 ENCOUNTER — Ambulatory Visit (INDEPENDENT_AMBULATORY_CARE_PROVIDER_SITE_OTHER): Payer: Federal, State, Local not specified - PPO | Admitting: *Deleted

## 2019-06-11 DIAGNOSIS — I4891 Unspecified atrial fibrillation: Secondary | ICD-10-CM

## 2019-06-11 LAB — CUP PACEART REMOTE DEVICE CHECK
Date Time Interrogation Session: 20200810163726
Implantable Pulse Generator Implant Date: 20181127

## 2019-06-13 ENCOUNTER — Telehealth: Payer: Self-pay | Admitting: *Deleted

## 2019-06-13 NOTE — Telephone Encounter (Signed)
Submitted PA to [employer] for sleep study. Faxed to [phone].

## 2019-06-18 NOTE — Progress Notes (Signed)
Carelink Summary Report / Loop Recorder 

## 2019-06-19 ENCOUNTER — Other Ambulatory Visit: Payer: Self-pay

## 2019-06-19 ENCOUNTER — Encounter (HOSPITAL_COMMUNITY): Payer: Self-pay | Admitting: Nurse Practitioner

## 2019-06-19 ENCOUNTER — Ambulatory Visit (HOSPITAL_COMMUNITY)
Admission: RE | Admit: 2019-06-19 | Discharge: 2019-06-19 | Disposition: A | Discharge status: Home / Self Care | Payer: Federal, State, Local not specified - PPO | Source: Ambulatory Visit | Attending: Nurse Practitioner | Admitting: Nurse Practitioner

## 2019-06-19 VITALS — BP 164/92 | HR 64 | Ht 69.0 in | Wt 205.8 lb

## 2019-06-19 DIAGNOSIS — E876 Hypokalemia: Secondary | ICD-10-CM | POA: Insufficient documentation

## 2019-06-19 DIAGNOSIS — Z79899 Other long term (current) drug therapy: Secondary | ICD-10-CM | POA: Insufficient documentation

## 2019-06-19 DIAGNOSIS — Z87891 Personal history of nicotine dependence: Secondary | ICD-10-CM | POA: Diagnosis not present

## 2019-06-19 DIAGNOSIS — I480 Paroxysmal atrial fibrillation: Secondary | ICD-10-CM

## 2019-06-19 DIAGNOSIS — I4891 Unspecified atrial fibrillation: Secondary | ICD-10-CM | POA: Insufficient documentation

## 2019-06-19 DIAGNOSIS — I10 Essential (primary) hypertension: Secondary | ICD-10-CM | POA: Insufficient documentation

## 2019-06-19 DIAGNOSIS — M1612 Unilateral primary osteoarthritis, left hip: Secondary | ICD-10-CM | POA: Diagnosis not present

## 2019-06-19 NOTE — Progress Notes (Addendum)
Patient ID: Jill Escobar, female   DOB: 08/23/1956, 63 y.o.   MRN: [id number]     Primary Care Physician: Georgianne Fickamachandran, Ajith, MD Referring Physician: Dr. Fredrik CoveAllred   Jill Escobar is a 63 y.o. female with a h/o PAF, s/p afib ablation  06/2015, that I have not seen in some time.She has noted increase of afib which has also been documented by her Linq.  Episodes have been between 7/22 and 7/27, 5 episodes noted. Longest episode has been 4 hours, 32 mins with highest v rate 176 bpm. She denies any change in health. 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  She denies alcohol, extra caffeine or decongestants. She states that she had a sleep study in 2015 but never heard the results. She does snore, unsure of apnea spells as husband snores just as bad. She admits to some weight gain.  F/u in afib clinic, 8/18. She reports no further afib since increase in  Cardizem. Her BP at home has been under good control but presented today at 164/92, rechecked at 140/90. 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 No symptoms but wants to be tested.  Today, she denies symptoms of palpitations, chest pain, shortness of breath, orthopnea, PND, lower extremity edema, dizziness, presyncope, syncope, or neurologic sequela. The patient is tolerating medications without difficulties and is otherwise without complaint today.   Past Medical History:  Diagnosis Date  . Atrial fibrillation ([facility])   . HTN (hypertension)   . Hypokalemia   . Osteoarthritis    L hip  . Overweight    Past Surgical History:  Procedure Laterality Date  . CARDIAC ELECTROPHYSIOLOGY STUDY AND ABLATION    . D&C and removal of endonetrial polyp    . drainage of cyst     follicular functional-type of cysyt- L ovary   . ELECTROPHYSIOLOGIC STUDY N/A 06/17/2015   Procedure: Atrial Fibrillation Ablation;  Surgeon: Hillis RangeJames Allred, MD;   [facility];  Service: Cardiovascular;  Laterality: N/A;  . JOINT REPLACEMENT Left 2012   hip  . LAPAROSCOPY     with lysis of pelvic adhesions   . LOOP RECORDER INSERTION N/A 09/27/2017   Procedure: LOOP RECORDER INSERTION;  Surgeon: Hillis RangeAllred, James, MD;  [facility];  Service: Cardiovascular;  Laterality: N/A;  . MASS EXCISION Right 05/02/2018   Procedure: EXCISION OF SKIN LESION ON RIGHT UPPER ARM;  Surgeon: Darnell LevelGerkin, Todd, MD;  Location: [facility];  Service: General;  Laterality: Right;  . NASAL ENDOSCOPY     upper  . TEE WITHOUT CARDIOVERSION N/A 04/14/2015   Procedure: TRANSESOPHAGEAL ECHOCARDIOGRAM (TEE);  Surgeon: Jake BatheMark C Skains, MD;  [facility];  Service: Cardiovascular;  Laterality: N/A;  . TEE WITHOUT CARDIOVERSION N/A 06/17/2015   Procedure: TRANSESOPHAGEAL ECHOCARDIOGRAM (TEE);  Surgeon: Chilton Siiffany Viola, MD;  [facility];  Service: Cardiovascular;  Laterality: N/A;  . uterosacral nerve ablation     with bipolar coagulation. Surgeon Dr. Jennette KettleNeal  . VESICOVAGINAL FISTULA CLOSURE W/ TAH      Current Outpatient Medications  Medication Sig Dispense Refill  . acetaminophen (TYLENOL) 500 MG tablet Take 1,000 mg by mouth at bedtime as needed (pain).    Marland Kitchen. diltiazem (CARDIZEM CD) 240 MG 24 hr capsule Take 1 capsule (240 mg total) by mouth daily. 30 capsule 3  . doxylamine, Sleep, (UNISOM) 25 MG tablet Take  25 mg by mouth at bedtime as needed for sleep.    . Multiple Vitamin (MULTIVITAMIN WITH MINERALS) TABS tablet Take 1 tablet by mouth daily.    Marland Kitchen. telmisartan-hydrochlorothiazide (MICARDIS HCT) 80-25 MG tablet Take 1 tablet by mouth daily. 90 tablet 3  . diltiazem (CARDIZEM) 30 MG tablet Take 1 tablet every 4 hours AS NEEDED for AFIB heart rate >100 as long as blood pressure >100. (Patient not taking: Reported on 06/19/2019) 45 tablet 1   No current facility-administered medications for this encounter.     Allergies   Allergen Reactions  . Shellfish Allergy Hives    Social History   Socioeconomic History  . Marital status: Married    Spouse name: Not on file  . Number of children: Not on file  . Years of education: Not on file  . Highest education level: Not on file  Occupational History  . Not on file  Social Needs  . Financial resource strain: Not on file  . Food insecurity    Worry: Not on file    Inability: Not on file  . Transportation needs    Medical: Not on file    Non-medical: Not on file  Tobacco Use  . Smoking status: Former Games developermoker  . Smokeless tobacco: Never Used  . Tobacco comment: has a 5 pack year hx. quit 4 years ago.   Substance and Sexual Activity  . Alcohol use: Yes    Comment: occasional   . Drug use: No  . Sexual activity: Not Currently  Lifestyle  . Physical activity    Days per week: Not on file    Minutes per session: Not on file  . Stress: Not on file  Relationships  . Social Musicianconnections    Talks on phone: Not on file    Gets together: Not on file    Attends religious service: Not on file    Active member of club or organization: Not on file    Attends meetings of clubs or organizations: Not on file    Relationship status: Not on file  . Intimate partner violence    Fear of current or ex partner: Not on file    Emotionally abused: Not on file    Physically abused: Not on file    Forced sexual activity: Not on file  Other Topics Concern  . Not on file  Social History Narrative   [place]. Has 2 children.    Postal Careers adviserclerk worker.    Walks her dog approx. 30 min/day. Has no specific diet     Family History  Problem Relation Age of Onset  . Cancer Father   . Other Mother        MVA    ROS- All systems are reviewed and negative except as per the HPI above  Physical Exam: Vitals:   06/19/19 1335  BP: (!) 164/92  Pulse: 64  Weight: 93.4 kg  Height: 5\' 9"  (1.753 m)    GEN- The patient is well appearing, alert and  oriented x 3 today.   Head- normocephalic, atraumatic Eyes-  Sclera clear, conjunctiva pink Ears- hearing intact Oropharynx- clear Neck- supple, no JVP Lymph- no cervical lymphadenopathy Lungs- Clear to ausculation bilaterally, normal work of breathing Heart- Regular rate and rhythm, no murmurs, rubs or gallops, PMI not laterally displaced GI- soft, NT, ND, + BS Extremities- no clubbing, cyanosis, or edema MS- no significant deformity or atrophy Skin- no rash or lesion Psych- euthymic mood,  full affect Neuro- strength and sensation are intact  EKG- SR at  64  bpm, pr int 150 ms, qrs int 78 ms, qtc 443 ms Epic records reviewed  Assessment and Plan: 1. Afib  Pt reports no afib burden since increase of Cardizem  to 240 mg daily Now off amlodipine, states BP at home under good control Has RX 30 mg cardizem to use as needed If this does not reduce afib episodes, discussed use of flecainide 50 mg bid Sleep  study  Weight loss/exercsie encouraged  Will not rx xarelto as of yet as she has a CHA2DS2VASc score of 2 and does not meet guidelines to take daily   2. HTN Stable  3. Recent covid exposure S/S reviewed Currently asymptomatic Community testing centers reviewed   F/u here as needed   Butch Penny C. Takelia Urieta, [facility] [address] [place], [place] [address] [phone]

## 2019-06-20 ENCOUNTER — Other Ambulatory Visit: Payer: Self-pay

## 2019-06-20 DIAGNOSIS — Z20822 Contact with and (suspected) exposure to covid-19: Secondary | ICD-10-CM

## 2019-06-21 LAB — NOVEL CORONAVIRUS, NAA: SARS-CoV-2, NAA: NOT DETECTED

## 2019-06-26 ENCOUNTER — Telehealth: Payer: Self-pay | Admitting: *Deleted

## 2019-06-26 NOTE — Telephone Encounter (Signed)
Received authorization from [employer] for sleep study. Authorization # [id number]. Valid DOS 06/03/19 to 08/02/19.

## 2019-06-26 NOTE — Telephone Encounter (Signed)
-----   Message from Juluis Mire, RN sent at 05/31/2019  9:26 AM EDT ----- Regarding: ? sleep study Pt needs sleep study for snoring, afib per donna carroll Thanks Stacy rn

## 2019-06-26 NOTE — Telephone Encounter (Signed)
Received a call from [facility] with [facility] sleep disorders. Patient called and cancelled her sleep study appointment. States she will call back at a later date to schedule. Message will will sent to ordering provider.

## 2019-06-29 ENCOUNTER — Other Ambulatory Visit (HOSPITAL_COMMUNITY): Payer: Federal, State, Local not specified - PPO

## 2019-07-02 ENCOUNTER — Encounter (HOSPITAL_BASED_OUTPATIENT_CLINIC_OR_DEPARTMENT_OTHER): Payer: Federal, State, Local not specified - PPO | Admitting: Cardiovascular Disease

## 2019-07-15 LAB — CUP PACEART REMOTE DEVICE CHECK
Date Time Interrogation Session: 20200912174058
Implantable Pulse Generator Implant Date: 20181127

## 2019-07-16 ENCOUNTER — Ambulatory Visit (INDEPENDENT_AMBULATORY_CARE_PROVIDER_SITE_OTHER): Payer: Federal, State, Local not specified - PPO | Admitting: *Deleted

## 2019-07-16 DIAGNOSIS — I4891 Unspecified atrial fibrillation: Secondary | ICD-10-CM

## 2019-07-27 NOTE — Progress Notes (Signed)
Carelink Summary Report / Loop Recorder 

## 2019-08-01 ENCOUNTER — Telehealth: Payer: Self-pay | Admitting: Emergency Medicine

## 2019-08-01 NOTE — Telephone Encounter (Signed)
LMOM. Need to assess for symptoms due to ongoing AF that started 07/31/19 @ 2037 and is in progress. NO OAC.

## 2019-08-01 NOTE — Telephone Encounter (Signed)
Pt returning [place] phone call. I told her Jill Escobar will give her a call back.

## 2019-08-01 NOTE — Telephone Encounter (Signed)
Spoke with patient, assisted with manual transmission for review of full episode. Presenting rhythm NSR. Total AF episode duration on 07/31/19 at 20:37 was 66min, median V rate 120bpm. Pt reports she took PRN diltiazem 30mg  shortly after episode began, felt improvement in symptoms in 30-40 minutes. Advised pt to call our office or the AF Clinic in the future for any symptomatic episodes not improved with PRN diltiazem. Pt verbalizes understanding and agreement with plan.  Reviewed [employer] monitor use--pt sends manual transmissions a few times a week if the date doesn't update on the monitor. Explained that monitor will alert [place] after 14 nights if it becomes disconnected, no need to send manual transmissions unless our office calls. Pt verbalizes understanding, no additional questions at this time.

## 2019-08-16 ENCOUNTER — Ambulatory Visit (INDEPENDENT_AMBULATORY_CARE_PROVIDER_SITE_OTHER): Payer: Federal, State, Local not specified - PPO | Admitting: *Deleted

## 2019-08-16 DIAGNOSIS — I4891 Unspecified atrial fibrillation: Secondary | ICD-10-CM

## 2019-08-16 LAB — CUP PACEART REMOTE DEVICE CHECK
Date Time Interrogation Session: 20201015163838
Implantable Pulse Generator Implant Date: 20181127

## 2019-08-29 NOTE — Progress Notes (Signed)
Carelink Summary Report / Loop Recorder 

## 2019-08-31 ENCOUNTER — Telehealth: Payer: Self-pay

## 2019-08-31 MED ORDER — DILTIAZEM HCL ER COATED BEADS 240 MG PO CP24: 240.0000 mg | ORAL_CAPSULE | Freq: Every day | ORAL | 0 refills | Status: DC

## 2019-08-31 NOTE — Telephone Encounter (Signed)
RX sent to pharmacy  

## 2019-09-07 ENCOUNTER — Other Ambulatory Visit (HOSPITAL_COMMUNITY): Payer: Self-pay | Admitting: *Deleted

## 2019-09-07 MED ORDER — DILTIAZEM HCL 30 MG PO TABS: ORAL_TABLET | ORAL | 1 refills | Status: DC

## 2019-09-18 ENCOUNTER — Ambulatory Visit (INDEPENDENT_AMBULATORY_CARE_PROVIDER_SITE_OTHER): Payer: Federal, State, Local not specified - PPO | Admitting: *Deleted

## 2019-09-18 DIAGNOSIS — I4891 Unspecified atrial fibrillation: Secondary | ICD-10-CM

## 2019-09-19 LAB — CUP PACEART REMOTE DEVICE CHECK
Date Time Interrogation Session: 20201117174451
Implantable Pulse Generator Implant Date: 20181127

## 2019-10-04 ENCOUNTER — Encounter (HOSPITAL_BASED_OUTPATIENT_CLINIC_OR_DEPARTMENT_OTHER): Payer: Self-pay

## 2019-10-04 ENCOUNTER — Other Ambulatory Visit: Payer: Self-pay

## 2019-10-04 ENCOUNTER — Emergency Department (HOSPITAL_BASED_OUTPATIENT_CLINIC_OR_DEPARTMENT_OTHER)

## 2019-10-04 ENCOUNTER — Emergency Department (HOSPITAL_BASED_OUTPATIENT_CLINIC_OR_DEPARTMENT_OTHER)
Admission: EM | Admit: 2019-10-04 | Discharge: 2019-10-04 | Disposition: A | Discharge status: Home / Self Care | Attending: Emergency Medicine | Admitting: Emergency Medicine

## 2019-10-04 DIAGNOSIS — Z87891 Personal history of nicotine dependence: Secondary | ICD-10-CM | POA: Diagnosis not present

## 2019-10-04 DIAGNOSIS — M79604 Pain in right leg: Secondary | ICD-10-CM | POA: Diagnosis not present

## 2019-10-04 DIAGNOSIS — Z79899 Other long term (current) drug therapy: Secondary | ICD-10-CM | POA: Insufficient documentation

## 2019-10-04 DIAGNOSIS — S299XXA Unspecified injury of thorax, initial encounter: Secondary | ICD-10-CM

## 2019-10-04 DIAGNOSIS — R0789 Other chest pain: Secondary | ICD-10-CM | POA: Diagnosis not present

## 2019-10-04 DIAGNOSIS — I10 Essential (primary) hypertension: Secondary | ICD-10-CM | POA: Diagnosis not present

## 2019-10-04 DIAGNOSIS — S8991XA Unspecified injury of right lower leg, initial encounter: Secondary | ICD-10-CM

## 2019-10-04 DIAGNOSIS — I4891 Unspecified atrial fibrillation: Secondary | ICD-10-CM | POA: Diagnosis not present

## 2019-10-04 DIAGNOSIS — Z91013 Allergy to seafood: Secondary | ICD-10-CM | POA: Insufficient documentation

## 2019-10-04 MED ORDER — METHOCARBAMOL 500 MG PO TABS: 500.0000 mg | ORAL_TABLET | Freq: Two times a day (BID) | ORAL | 0 refills | Status: DC

## 2019-10-04 NOTE — Discharge Instructions (Addendum)
As discussed, nothing is broken in your chest. Your pain is most likely related to muscle strains. I am sending you home with a muscle relaxer. Take as prescribed. Medication can make you drowsy, so do not drive or operate machinery while on the medication. You may take over the counter Tylenol as needed for pain. If your pain does not improve within the next week, follow-up with your PCP. I have included a sheet with occupational health clinic services. You may call or email them if you have any questions. Return to the ER for new or worsening symptoms.

## 2019-10-04 NOTE — ED Provider Notes (Signed)
[facility] EMERGENCY DEPARTMENT Provider Note   CSN: [id number] Arrival date & time: 10/04/19  0910     History   Chief Complaint Chief Complaint  Patient presents with  . Leg Injury    HPI Jill Escobar is a 63 y.o. female with a past medical history of hypertension and A. fib who presents to the ED after an accident that occurred at work just prior to arrival.  Patient works at the [employer]. Patient was pushing a 350lb nutty truck when a forklift pushed the truck into the patient's chest and right lower leg.  Patient notes directly after the accident she had extreme shortness of breath due to pain which has since resolved. Patient rates her pain a 3/10 and describes it a dull pain worse with movement. Patient denies head injury and loss of consciousness. Patient was able to ambulate after the accident. No treatment prior to arrival. Patient denies current shortness of breath, headaches, abdominal pain, nausea, and vomiting. Patient is not currently on any blood thinners.   Past Medical History:  Diagnosis Date  . Atrial fibrillation ([place])   . HTN (hypertension)   . Hypokalemia   . Osteoarthritis    L hip  . Overweight     Patient Active Problem List   Diagnosis Date Noted  . Skin lesion of upper extremity 05/02/2018  . A-fib ([place]) 06/17/2015  . Abnormal echocardiogram 04/09/2015  . HYPOKALEMIA 05/13/2010  . Essential hypertension 05/13/2010  . Atrial fibrillation ([place]) 05/13/2010  . OSTEOARTHRITIS, HIP, LEFT 05/13/2010  . History of cardiovascular disorder 05/13/2010    Past Surgical History:  Procedure Laterality Date  . CARDIAC ELECTROPHYSIOLOGY STUDY AND ABLATION    . D&C and removal of endonetrial polyp    . drainage of cyst     follicular functional-type of cysyt- L ovary   . ELECTROPHYSIOLOGIC STUDY N/A 06/17/2015   Procedure: Atrial Fibrillation Ablation;  Surgeon: Thompson Grayer, MD;  Location: [facility];  Service: Cardiovascular;   Laterality: N/A;  . JOINT REPLACEMENT Left 2012   hip  . LAPAROSCOPY     with lysis of pelvic adhesions   . LOOP RECORDER INSERTION N/A 09/27/2017   Procedure: LOOP RECORDER INSERTION;  Surgeon: Thompson Grayer, MD;  Location: [facility];  Service: Cardiovascular;  Laterality: N/A;  . MASS EXCISION Right 05/02/2018   Procedure: EXCISION OF SKIN LESION ON RIGHT UPPER ARM;  Surgeon: Armandina Gemma, MD;  Location: [facility];  Service: General;  Laterality: Right;  . NASAL ENDOSCOPY     upper  . TEE WITHOUT CARDIOVERSION N/A 04/14/2015   Procedure: TRANSESOPHAGEAL ECHOCARDIOGRAM (TEE);  Surgeon: Jerline Pain, MD;  Location: [facility];  Service: Cardiovascular;  Laterality: N/A;  . TEE WITHOUT CARDIOVERSION N/A 06/17/2015   Procedure: TRANSESOPHAGEAL ECHOCARDIOGRAM (TEE);  Surgeon: Skeet Latch, MD;  Location: [facility];  Service: Cardiovascular;  Laterality: N/A;  . uterosacral nerve ablation     with bipolar coagulation. Surgeon Dr. Nori Riis  . VESICOVAGINAL FISTULA CLOSURE W/ TAH       OB History   No obstetric history on file.      Home Medications    Prior to Admission medications   Medication Sig Start Date End Date Taking? Authorizing Provider  acetaminophen (TYLENOL) 500 MG tablet Take 1,000 mg by mouth at bedtime as needed (pain).    [provider]  diltiazem (CARDIZEM CD) 240 MG 24 hr capsule Take 1 capsule (240 mg total) by mouth  daily. 08/31/19   Allred, Fayrene Fearing, MD  diltiazem (CARDIZEM) 30 MG tablet Take 1 tablet every 4 hours AS NEEDED for AFIB heart rate >100 as long as blood pressure >100. 09/07/19   Newman Nip, NP  doxylamine, Sleep, (UNISOM) 25 MG tablet Take 25 mg by mouth at bedtime as needed for sleep.    [provider]  methocarbamol (ROBAXIN) 500 MG tablet Take 1 tablet (500 mg total) by mouth 2 (two) times daily. 10/04/19   Cheek, Vesta Mixer, PA-C  Multiple Vitamin (MULTIVITAMIN WITH MINERALS) TABS tablet Take 1 tablet  by mouth daily.    [provider]  telmisartan-hydrochlorothiazide (MICARDIS HCT) 80-25 MG tablet Take 1 tablet by mouth daily. 11/22/18   Hillis Range, MD    Family History Family History  Problem Relation Age of Onset  . Cancer Father   . Other Mother        MVA    Social History Social History   Tobacco Use  . Smoking status: Former Games developer  . Smokeless tobacco: Never Used  . Tobacco comment: has a 5 pack year hx. quit 4 years ago.   Substance Use Topics  . Alcohol use: Yes    Comment: occasional   . Drug use: No     Allergies   Shellfish allergy   Review of Systems Review of Systems  Constitutional: Negative for chills and fever.  Respiratory: Positive for shortness of breath (resolved).   Cardiovascular: Positive for chest pain (chest wall pain).  Gastrointestinal: Negative for abdominal pain, diarrhea, nausea and vomiting.  Musculoskeletal: Positive for gait problem and myalgias.  Skin: Negative for color change.  Neurological: Negative for numbness and headaches.     Physical Exam Updated Vital Signs BP (!) 161/84 (BP Location: Left Arm)   Pulse 64   Temp 98.5 F (36.9 C) (Oral)   Resp 16   Ht  (1.727 m)   Wt 93 kg   SpO2 100%   BMI 31.17 kg/m   Physical Exam Vitals signs and nursing note reviewed.  Constitutional:      General: She is not in acute distress.    Appearance: She is not ill-appearing.  HENT:     Head: Normocephalic.  Eyes:     Conjunctiva/sclera: Conjunctivae normal.  Neck:     Musculoskeletal: Neck supple.     Comments: No cervical midline tenderness. Cardiovascular:     Rate and Rhythm: Normal rate and regular rhythm.     Pulses: Normal pulses.     Heart sounds: Normal heart sounds. No murmur. No friction rub. No gallop.   Pulmonary:     Effort: Pulmonary effort is normal.     Breath sounds: Normal breath sounds.     Comments: Respirations equal and unlabored, patient able to speak in full sentences, lungs  clear to auscultation bilaterally  Chest:     Comments: Tenderness to palpation over anterior chest wall. No crepitus. No deformity. No ecchymosis.  Abdominal:     General: Abdomen is flat. There is no distension.     Palpations: Abdomen is soft.     Tenderness: There is no abdominal tenderness. There is no guarding or rebound.     Comments: Abdomen soft, nondistended, nontender to palpation in all quadrants without guarding or peritoneal signs. No rebound.   Musculoskeletal:     Comments: No T-spine and L-spine midline tenderness, no stepoff or deformity No leg edema bilaterally Patient moves all extremities without difficulty. DP/PT pulses 2+ and equal  bilaterally Sensation grossly intact bilaterally Strength of knee flexion and extension is 5/5 Plantar and dorsiflexion of ankle 5/5 Achilles and patellar reflexes present and equal Able to ambulate without difficulty  Tenderness over right shin with no deformity or crepitus. No erythema, edema, or ecchymosis. Full ROM of both right knee and ankle. Sensation and pulses intact. Soft compartments.  Skin:    General: Skin is warm and dry.  Neurological:     General: No focal deficit present.     Mental Status: She is alert.      ED Treatments / Results  Labs (all labs ordered are listed, but only abnormal results are displayed) Labs Reviewed - No data to display  EKG None  Radiology Dg Chest 2 View  Result Date: 10/04/2019 CLINICAL DATA:  Chest injury EXAM: CHEST - 2 VIEW COMPARISON:  04/21/2011 chest radiograph. FINDINGS: Loop recorder overlies the left heart. Stable cardiomediastinal silhouette with top-normal heart size. No pneumothorax. No pleural effusion. Stable mild biapical pleural-parenchymal scarring. No pulmonary edema. No acute consolidative airspace disease. No displaced fractures in the visualized chest. IMPRESSION: No active cardiopulmonary disease. Electronically Signed   By: Delbert Phenix M.D.   On: 10/04/2019  10:09    Procedures Procedures (including critical care time)  Medications Ordered in ED Medications - No data to display   Initial Impression / Assessment and Plan / ED Course  I have reviewed the triage vital signs and the nursing notes.  Pertinent labs & imaging results that were available during my care of the patient were reviewed by me and considered in my medical decision making (see chart for details).       63 year old female presents to the ED for an evaluation after an accident that occurred at work just prior to arrival.  Patient admits to anterior chest wall tenderness and right shin pain.  Stable vitals with mildly elevated blood pressure at 161/84.  Patient is not tachycardic or hypoxic.  Patient in no acute distress and rather well-appearing.  Anterior chest wall tenderness with no deformity or crepitus.  No ecchymosis on the chest.  Lungs clear to auscultation bilaterally with equal rise and fall the chest.  Tenderness to palpation on the right shin with no deformity or crepitus.  Neurovascularly intact. Soft compartments. Patient able to ambulate without difficulty in the ED.  Will order chest x-ray to rule out bony fractures or PTX. Shared decision making in regards to right leg x-ray. Patient does not feel it is needed at this time. Suspect muscle injury.   Chest x-ray personally reviewed which is negative for signs of infection and acute abnormalities.  Will discharge patient with a muscle relaxer.  Patient instructed medication can cause drowsiness and was told not drive or operate machinery while on it.  Patient has been advised to take over-the-counter Tylenol as needed for pain.  Work note provided to patient.  Occupational health services sheet given to patient at discharge. Strict ED precautions discussed with patient. Patient states understanding and agrees to plan. Patient discharged home in no acute distress and stable vitals  Final Clinical Impressions(s) / ED  Diagnoses   Final diagnoses:  Injury of chest wall, initial encounter  Injury of right lower extremity, initial encounter    ED Discharge Orders         Ordered    methocarbamol (ROBAXIN) 500 MG tablet  2 times daily     10/04/19 1031           Cheek, [place]  B, PA-C 10/04/19 1042    Virgina NorfolkCuratolo, Adam, DO 10/04/19 1317

## 2019-10-04 NOTE — ED Triage Notes (Signed)
Pt states injured at work, large object fell into her right leg and chest.  Painful, able to ambulate with steady gait. No head injury.  Did not fall to ground, was pushed back by the impact.

## 2019-10-04 NOTE — ED Notes (Signed)
Pt states an object was pushed into her chest and leg,  Did not fall,   Pt states was not told to get drug screen

## 2019-10-15 NOTE — Progress Notes (Signed)
Carelink Summary Report / Loop Recorder 

## 2019-10-22 ENCOUNTER — Ambulatory Visit (INDEPENDENT_AMBULATORY_CARE_PROVIDER_SITE_OTHER): Payer: Federal, State, Local not specified - PPO | Admitting: *Deleted

## 2019-10-22 DIAGNOSIS — I4891 Unspecified atrial fibrillation: Secondary | ICD-10-CM | POA: Diagnosis not present

## 2019-10-22 DIAGNOSIS — L720 Epidermal cyst: Secondary | ICD-10-CM | POA: Diagnosis not present

## 2019-10-22 LAB — CUP PACEART REMOTE DEVICE CHECK
Date Time Interrogation Session: 20201220125007
Implantable Pulse Generator Implant Date: 20181127

## 2019-11-14 ENCOUNTER — Telehealth: Payer: Self-pay

## 2019-11-14 NOTE — Telephone Encounter (Signed)
LMOVM for pt to stop sending manual transmissions with home monitor. 

## 2019-11-26 ENCOUNTER — Ambulatory Visit (INDEPENDENT_AMBULATORY_CARE_PROVIDER_SITE_OTHER): Payer: Federal, State, Local not specified - PPO | Admitting: *Deleted

## 2019-11-26 DIAGNOSIS — I4891 Unspecified atrial fibrillation: Secondary | ICD-10-CM | POA: Diagnosis not present

## 2019-11-26 LAB — CUP PACEART REMOTE DEVICE CHECK
Date Time Interrogation Session: 20210124232206
Implantable Pulse Generator Implant Date: 20181127

## 2019-11-28 DIAGNOSIS — Z20828 Contact with and (suspected) exposure to other viral communicable diseases: Secondary | ICD-10-CM | POA: Diagnosis not present

## 2019-11-28 DIAGNOSIS — J069 Acute upper respiratory infection, unspecified: Secondary | ICD-10-CM | POA: Diagnosis not present

## 2019-11-28 DIAGNOSIS — Z03818 Encounter for observation for suspected exposure to other biological agents ruled out: Secondary | ICD-10-CM | POA: Diagnosis not present

## 2019-11-28 DIAGNOSIS — I10 Essential (primary) hypertension: Secondary | ICD-10-CM | POA: Diagnosis not present

## 2019-12-13 ENCOUNTER — Other Ambulatory Visit: Payer: Self-pay | Admitting: Internal Medicine

## 2019-12-13 DIAGNOSIS — I480 Paroxysmal atrial fibrillation: Secondary | ICD-10-CM

## 2019-12-13 DIAGNOSIS — I10 Essential (primary) hypertension: Secondary | ICD-10-CM

## 2019-12-18 ENCOUNTER — Encounter: Payer: Self-pay | Admitting: Internal Medicine

## 2019-12-18 ENCOUNTER — Telehealth (INDEPENDENT_AMBULATORY_CARE_PROVIDER_SITE_OTHER): Payer: Federal, State, Local not specified - PPO | Admitting: Internal Medicine

## 2019-12-18 ENCOUNTER — Telehealth: Payer: Self-pay

## 2019-12-18 VITALS — BP 142/84 | HR 68 | Ht 68.0 in | Wt 205.0 lb

## 2019-12-18 DIAGNOSIS — I10 Essential (primary) hypertension: Secondary | ICD-10-CM

## 2019-12-18 DIAGNOSIS — I480 Paroxysmal atrial fibrillation: Secondary | ICD-10-CM | POA: Diagnosis not present

## 2019-12-18 NOTE — Telephone Encounter (Signed)
-----   Message from Hillis Range, MD sent at 12/18/2019  9:54 AM EST ----- Jill Escobar  Please refer to the HTN clinic  [place], follow-up with me in a year

## 2019-12-18 NOTE — Telephone Encounter (Signed)
Referral placed for the pt to be in the HTN clinic.

## 2019-12-18 NOTE — Progress Notes (Signed)
Electrophysiology TeleHealth Note   Due to national recommendations of social distancing due to COVID 19, an audio/video telehealth visit is felt to be most appropriate for this patient at this time.  See MyChart message from today for the patient's consent to telehealth for [facility].  Date:  12/18/2019   ID:  Jill Escobar, DOB [birth year], MRN [id number]  Location: patient's home  Provider location:  [facility]  Evaluation Performed: Follow-up visit  PCP:  Merrilee Seashore, MD   Electrophysiologist:  Dr Rayann Heman  Chief Complaint:  palpitations  History of Present Illness:    Jill Escobar is a 64 y.o. female who presents via telehealth conferencing today.  Since last being seen in our clinic, the patient reports doing very well.  Today, she denies symptoms of palpitations, chest pain, shortness of breath,  lower extremity edema, dizziness, presyncope, or syncope.  The patient is otherwise without complaint today.  The patient denies symptoms of fevers, chills, cough, or new SOB worrisome for COVID 19.  Past Medical History:  Diagnosis Date  . Atrial fibrillation ([place])   . HTN (hypertension)   . Hypokalemia   . Osteoarthritis    L hip  . Overweight     Past Surgical History:  Procedure Laterality Date  . CARDIAC ELECTROPHYSIOLOGY STUDY AND ABLATION    . D&C and removal of endonetrial polyp    . drainage of cyst     follicular functional-type of cysyt- L ovary   . ELECTROPHYSIOLOGIC STUDY N/A 06/17/2015   Procedure: Atrial Fibrillation Ablation;  Surgeon: Thompson Grayer, MD;  Location: [facility];  Service: Cardiovascular;  Laterality: N/A;  . JOINT REPLACEMENT Left 2012   hip  . LAPAROSCOPY     with lysis of pelvic adhesions   . LOOP RECORDER INSERTION N/A 09/27/2017   Procedure: LOOP RECORDER INSERTION;  Surgeon: Thompson Grayer, MD;  Location: [facility];  Service: Cardiovascular;  Laterality: N/A;  . MASS EXCISION Right 05/02/2018   Procedure: EXCISION OF SKIN LESION ON RIGHT UPPER ARM;  Surgeon: Armandina Gemma, MD;  Location: [facility];  Service: General;  Laterality: Right;  . NASAL ENDOSCOPY     upper  . TEE WITHOUT CARDIOVERSION N/A 04/14/2015   Procedure: TRANSESOPHAGEAL ECHOCARDIOGRAM (TEE);  Surgeon: Jerline Pain, MD;  Location: [facility];  Service: Cardiovascular;  Laterality: N/A;  . TEE WITHOUT CARDIOVERSION N/A 06/17/2015   Procedure: TRANSESOPHAGEAL ECHOCARDIOGRAM (TEE);  Surgeon: Skeet Latch, MD;  Location: [facility] ENDOSCOPY;  Service: Cardiovascular;  Laterality: N/A;  . uterosacral nerve ablation     with bipolar coagulation. Surgeon Dr. Nori Riis  . VESICOVAGINAL FISTULA CLOSURE W/ TAH      Current Outpatient Medications  Medication Sig Dispense Refill  . acetaminophen (TYLENOL) 500 MG tablet Take 1,000 mg by mouth at bedtime as needed (pain).    . CARTIA XT 240 MG 24 hr capsule Take 1 capsule by mouth once daily 90 capsule 0  . diltiazem (CARDIZEM) 30 MG tablet Take 1 tablet every 4 hours AS NEEDED for AFIB heart rate >100 as long as blood pressure >100. 45 tablet 1  . doxylamine, Sleep, (UNISOM) 25 MG tablet Take 25 mg by mouth at bedtime as needed for sleep.    . methocarbamol (ROBAXIN) 500 MG tablet Take 1 tablet (500 mg total) by mouth 2 (two) times daily. 20 tablet 0  . Multiple Vitamin (MULTIVITAMIN WITH MINERALS) TABS tablet Take 1 tablet by mouth daily.    Marland Kitchen  telmisartan-hydrochlorothiazide (MICARDIS HCT) 80-25 MG tablet Take 1 tablet by mouth once daily 90 tablet 0   No current facility-administered medications for this visit.    Allergies:   Shellfish allergy   Social History:  The patient  reports that she has quit smoking. She has never used smokeless tobacco. She reports current alcohol use. She reports that she does not use drugs.   Family History:  The patient's family history includes Cancer in her father; Other in her mother.   ROS:  Please see the history of present  illness.   All other systems are personally reviewed and negative.    Exam:    Vital Signs:  BP (!) 142/84   Pulse 68   Ht 5\' 8"  (1.727 m)   Wt 205 lb (93 kg)   BMI 31.17 kg/m   Well sounding and appearing, alert and conversant, regular work of breathing,  good skin color Eyes- anicteric, neuro- grossly intact, skin- no apparent rash or lesions or cyanosis, mouth- oral mucosa is pink  Labs/Other Tests and Data Reviewed:    Recent Labs: No results found for requested labs within last 8760 hours.   Wt Readings from Last 3 Encounters:  12/18/19 205 lb (93 kg)  10/04/19 205 lb (93 kg)  06/19/19 205 lb 12.8 oz (93.4 kg)     Last device remote is reviewed from [employer] PDF which reveals normal device function, no arrhythmias    ASSESSMENT & PLAN:    1.  Paroxysmal atrial fibrillation doing very well post ablation off AAD therapy chads2vasc score is 2.  She does not require OAC therapy. Her ILR reveals no afib  2. HTN Labile at home.  She is very concerned  She has not seen PCP in "years".  I have encouraged her to follow-up with primary care.  In addition, we will refer to the HTN clinic.  She prefers virtual visits over "in office" due to covid. Lifestyle modification encouarge  3. Overweight Lifestyle modification is encouraged  Follow-up:  12 months with me Refer to HTN clinic  Patient Risk:  after full review of this patients clinical status, I feel that they are at moderate risk at this time.  Today, I have spent 15 minutes with the patient with telehealth technology discussing arrhythmia management .    06/21/19, MD  12/18/2019 9:49 AM     [facility] [address] [place] [phone] (office) [phone] (fax)

## 2019-12-20 ENCOUNTER — Ambulatory Visit: Payer: Federal, State, Local not specified - PPO | Admitting: Cardiology

## 2019-12-24 ENCOUNTER — Ambulatory Visit: Payer: Federal, State, Local not specified - PPO

## 2019-12-24 NOTE — Progress Notes (Deleted)
Patient ID: Jill Escobar                 DOB: 26-Jul-1956                      MRN: [id number]     HPI: Jill Escobar is a 64 y.o. female referred by Dr. Johney Frame to HTN clinic. PMH is significant for afib, HTN, hypokalemia and osteoarthritis. Patient saw Dr. Johney Frame on 2/16 via video visit. Patient stated she was concerned about her blood pressure as she has not seen her PCP in "years." Blood pressure at that visit was 142/84, HR 68.   Patient presents to the HTN clinic for initial visit.  How much ETOH? HR? Increase diltiazem? Spironolactone? Would need BMP   Current HTN meds: Cartia XT 240mg  daily, telmisartan/HCTZ 80/25 daily Previously tried: amlodipine, metoprolol succinate BP goal: <130/80  Family History: The patient's family history includes Cancer in her father; Other in her mother.   Social History: The patient  reports that she has quit smoking. She has never used smokeless tobacco. She reports current alcohol use. She reports that she does not use drugs.   Diet:   Exercise:   Home BP readings:   Wt Readings from Last 3 Encounters:  12/18/19 205 lb (93 kg)  10/04/19 205 lb (93 kg)  06/19/19 205 lb 12.8 oz (93.4 kg)   BP Readings from Last 3 Encounters:  12/18/19 (!) 142/84  10/04/19 (!) 161/84  06/19/19 (!) 164/92   Pulse Readings from Last 3 Encounters:  12/18/19 68  10/04/19 64  06/19/19 64    Renal function: CrCl cannot be calculated (Patient's most recent lab result is older than the maximum 21 days allowed.).  Past Medical History:  Diagnosis Date  . Atrial fibrillation ([facility])   . HTN (hypertension)   . Hypokalemia   . Osteoarthritis    L hip  . Overweight     Current Outpatient Medications on File Prior to Visit  Medication Sig Dispense Refill  . acetaminophen (TYLENOL) 500 MG tablet Take 1,000 mg by mouth at bedtime as needed (pain).    . CARTIA XT 240 MG 24 hr capsule Take 1 capsule by mouth once daily 90 capsule 0  . diltiazem  (CARDIZEM) 30 MG tablet Take 1 tablet every 4 hours AS NEEDED for AFIB heart rate >100 as long as blood pressure >100. 45 tablet 1  . doxylamine, Sleep, (UNISOM) 25 MG tablet Take 25 mg by mouth at bedtime as needed for sleep.    . methocarbamol (ROBAXIN) 500 MG tablet Take 1 tablet (500 mg total) by mouth 2 (two) times daily. 20 tablet 0  . Multiple Vitamin (MULTIVITAMIN WITH MINERALS) TABS tablet Take 1 tablet by mouth daily.    06/21/19 telmisartan-hydrochlorothiazide (MICARDIS HCT) 80-25 MG tablet Take 1 tablet by mouth once daily 90 tablet 0   No current facility-administered medications on file prior to visit.    Allergies  Allergen Reactions  . Shellfish Allergy Hives    There were no vitals taken for this visit.   Assessment/Plan:  1. Hypertension -    Thank you  Marland Kitchen, Pharm.D, BCPS, CPP [facility]  [address], [place], [place]  Phone: [phone]; Fax: [phone]

## 2019-12-31 ENCOUNTER — Ambulatory Visit (INDEPENDENT_AMBULATORY_CARE_PROVIDER_SITE_OTHER): Payer: Federal, State, Local not specified - PPO | Admitting: *Deleted

## 2019-12-31 DIAGNOSIS — I4891 Unspecified atrial fibrillation: Secondary | ICD-10-CM

## 2019-12-31 LAB — CUP PACEART REMOTE DEVICE CHECK
Date Time Interrogation Session: 20210228232708
Implantable Pulse Generator Implant Date: 20181127

## 2019-12-31 NOTE — Progress Notes (Signed)
ILR Remote 

## 2020-01-04 ENCOUNTER — Ambulatory Visit: Payer: Federal, State, Local not specified - PPO | Attending: Internal Medicine

## 2020-01-04 DIAGNOSIS — Z23 Encounter for immunization: Secondary | ICD-10-CM | POA: Insufficient documentation

## 2020-01-04 NOTE — Progress Notes (Signed)
   Covid-19 Vaccination Clinic  Name:  YANA SCHORR    MRN: [id number] DOB: [birth year]  01/04/2020  Ms. Alvelo was observed post Covid-19 immunization for 15 minutes without incident. She was provided with Vaccine Information Sheet and instruction to access the V-Safe system.   Ms. Bump was instructed to call 911 with any severe reactions post vaccine: Marland Kitchen Difficulty breathing  . Swelling of face and throat  . A fast heartbeat  . A bad rash all over body  . Dizziness and weakness

## 2020-01-25 ENCOUNTER — Ambulatory Visit: Payer: Federal, State, Local not specified - PPO

## 2020-02-04 ENCOUNTER — Ambulatory Visit (INDEPENDENT_AMBULATORY_CARE_PROVIDER_SITE_OTHER): Payer: Federal, State, Local not specified - PPO | Admitting: *Deleted

## 2020-02-04 DIAGNOSIS — I4891 Unspecified atrial fibrillation: Secondary | ICD-10-CM

## 2020-02-04 NOTE — Progress Notes (Signed)
ILR Remote 

## 2020-02-05 ENCOUNTER — Ambulatory Visit: Payer: Federal, State, Local not specified - PPO | Attending: Internal Medicine

## 2020-02-05 DIAGNOSIS — Z23 Encounter for immunization: Secondary | ICD-10-CM

## 2020-02-05 NOTE — Progress Notes (Signed)
   Covid-19 Vaccination Clinic  Name:  Jill Escobar    MRN: [id number] DOB: 04/01/1956  02/05/2020  [name] was observed post Covid-19 immunization for 15 minutes without incident. She was provided with Vaccine Information Sheet and instruction to access the V-Safe system.   [name] was instructed to call 911 with any severe reactions post vaccine: Marland Kitchen Difficulty breathing  . Swelling of face and throat  . A fast heartbeat  . A bad rash all over body  . Dizziness and weakness   Immunizations Administered    Name Date Dose VIS Date Route   Pfizer COVID-19 Vaccine 02/05/2020  3:18 PM 0.3 mL 10/12/2019 Intramuscular   Manufacturer: [employer], [employer]   Lot: [id number]   NDC: [address]-2

## 2020-02-07 ENCOUNTER — Encounter (HOSPITAL_BASED_OUTPATIENT_CLINIC_OR_DEPARTMENT_OTHER): Payer: Self-pay

## 2020-02-07 ENCOUNTER — Emergency Department (HOSPITAL_BASED_OUTPATIENT_CLINIC_OR_DEPARTMENT_OTHER)
Admission: EM | Admit: 2020-02-07 | Discharge: 2020-02-07 | Disposition: A | Discharge status: Home / Self Care | Payer: Federal, State, Local not specified - PPO | Attending: Emergency Medicine | Admitting: Emergency Medicine

## 2020-02-07 ENCOUNTER — Other Ambulatory Visit: Payer: Self-pay

## 2020-02-07 DIAGNOSIS — Z87891 Personal history of nicotine dependence: Secondary | ICD-10-CM | POA: Diagnosis not present

## 2020-02-07 DIAGNOSIS — M791 Myalgia, unspecified site: Secondary | ICD-10-CM | POA: Diagnosis not present

## 2020-02-07 DIAGNOSIS — Z20822 Contact with and (suspected) exposure to covid-19: Secondary | ICD-10-CM | POA: Insufficient documentation

## 2020-02-07 DIAGNOSIS — R05 Cough: Secondary | ICD-10-CM | POA: Diagnosis not present

## 2020-02-07 DIAGNOSIS — Z79899 Other long term (current) drug therapy: Secondary | ICD-10-CM | POA: Insufficient documentation

## 2020-02-07 DIAGNOSIS — I10 Essential (primary) hypertension: Secondary | ICD-10-CM | POA: Insufficient documentation

## 2020-02-07 DIAGNOSIS — M7918 Myalgia, other site: Secondary | ICD-10-CM | POA: Insufficient documentation

## 2020-02-07 DIAGNOSIS — R52 Pain, unspecified: Secondary | ICD-10-CM

## 2020-02-07 LAB — SARS CORONAVIRUS 2 (TAT 6-24 HRS): SARS Coronavirus 2: NEGATIVE

## 2020-02-07 NOTE — Discharge Instructions (Addendum)
Please continue take Tylenol as needed for your body aches, fevers, chills.  You have been tested for COVID-19.  You did not get COVID-19 from the vaccine.  However, you could have been exposed to COVID-19 while having the vaccine administered.  Please maintain isolation precautions pending results of COVID-19 testing.  It is much more likely that the symptoms you are experiencing are simply adverse effects that are well described and expected with administration of second vaccine.  Please return to the ED or seek immediate medical attention should experience any new or worsening symptoms.

## 2020-02-07 NOTE — ED Provider Notes (Signed)
[facility] EMERGENCY DEPARTMENT Provider Note   CSN: [id number] Arrival date & time: 02/07/20  1446     History Chief Complaint  Patient presents with  . Cough    Jill Escobar is a 64 y.o. female with PMH significant for paroxysmal atrial fibrillation, HTN, and second COVID-19 vaccine administration on 02/05/2020 presents to the ED with complaints of cough, chills, body aches, and left arm injection site discomfort.  Patient states that shortly after receiving her second COVID-19 vaccine, she developed the symptoms in addition to a low-grade fever of 99.5 F.  She states that she typically runs lower than that and checks it regularly.  She is unsure as to whether or not she is having a reaction to the vaccine.  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  She states that her cough is worse at night.  She has been taking Tylenol, with some effect.  She denies any headache or dizziness, sore throat or congestion, abdominal pain, nausea or vomiting, urinary symptoms, or changes in bowel habits.  HPI     Past Medical History:  Diagnosis Date  . Atrial fibrillation (HCC)   . HTN (hypertension)   . Hypokalemia   . Osteoarthritis    L hip  . Overweight     Patient Active Problem List   Diagnosis Date Noted  . Skin lesion of upper extremity 05/02/2018  . A-fib (HCC) 06/17/2015  . Abnormal echocardiogram 04/09/2015  . HYPOKALEMIA 05/13/2010  . Essential hypertension 05/13/2010  . Atrial fibrillation (HCC) 05/13/2010  . OSTEOARTHRITIS, HIP, LEFT 05/13/2010  . History of cardiovascular disorder 05/13/2010    Past Surgical History:  Procedure Laterality Date  . CARDIAC ELECTROPHYSIOLOGY STUDY AND ABLATION    . D&C and removal of endonetrial polyp    . drainage of cyst     follicular functional-type of cysyt- L ovary   . ELECTROPHYSIOLOGIC STUDY N/A 06/17/2015   Procedure: Atrial Fibrillation Ablation;  Surgeon: Hillis Range, MD;  Location: [facility]  INVASIVE CV LAB;  Service: Cardiovascular;  Laterality: N/A;  . JOINT REPLACEMENT Left 2012   hip  . LAPAROSCOPY     with lysis of pelvic adhesions   . LOOP RECORDER INSERTION N/A 09/27/2017   Procedure: LOOP RECORDER INSERTION;  Surgeon: Hillis Range, MD;  Location: [facility] INVASIVE CV LAB;  Service: Cardiovascular;  Laterality: N/A;  . MASS EXCISION Right 05/02/2018   Procedure: EXCISION OF SKIN LESION ON RIGHT UPPER ARM;  Surgeon: Darnell Level, MD;  Location: [facility];  Service: General;  Laterality: Right;  . NASAL ENDOSCOPY     upper  . TEE WITHOUT CARDIOVERSION N/A 04/14/2015   Procedure: TRANSESOPHAGEAL ECHOCARDIOGRAM (TEE);  Surgeon: Jake Bathe, MD;  Location: [facility] ENDOSCOPY;  Service: Cardiovascular;  Laterality: N/A;  . TEE WITHOUT CARDIOVERSION N/A 06/17/2015   Procedure: TRANSESOPHAGEAL ECHOCARDIOGRAM (TEE);  Surgeon: Chilton Si, MD;  Location: [facility] ENDOSCOPY;  Service: Cardiovascular;  Laterality: N/A;  . uterosacral nerve ablation     with bipolar coagulation. Surgeon Dr. Jennette Kettle  . VESICOVAGINAL FISTULA CLOSURE W/ TAH       OB History   No obstetric history on file.     Family History  Problem Relation Age of Onset  . Cancer Father   . Other Mother        MVA    Social History   Tobacco Use  . Smoking status: Former Games developer  . Smokeless tobacco: Never Used  .  Tobacco comment: has a 5 pack year hx. quit 4 years ago.   Substance Use Topics  . Alcohol use: Yes    Comment: occasional   . Drug use: No    Home Medications Prior to Admission medications   Medication Sig Start Date End Date Taking? Authorizing Provider  acetaminophen (TYLENOL) 500 MG tablet Take 1,000 mg by mouth at bedtime as needed (pain).    [provider]  CARTIA XT 240 MG 24 hr capsule Take 1 capsule by mouth once daily 12/13/19   Allred, Fayrene Fearing, MD  diltiazem (CARDIZEM) 30 MG tablet Take 1 tablet every 4 hours AS NEEDED for AFIB heart rate >100 as long as blood pressure  >100. 09/07/19   Newman Nip, NP  doxylamine, Sleep, (UNISOM) 25 MG tablet Take 25 mg by mouth at bedtime as needed for sleep.    [provider]  methocarbamol (ROBAXIN) 500 MG tablet Take 1 tablet (500 mg total) by mouth 2 (two) times daily. 10/04/19   Mannie Stabile, PA-C  Multiple Vitamin (MULTIVITAMIN WITH MINERALS) TABS tablet Take 1 tablet by mouth daily.    [provider]  telmisartan-hydrochlorothiazide (MICARDIS HCT) 80-25 MG tablet Take 1 tablet by mouth once daily 12/13/19   Allred, Fayrene Fearing, MD    Allergies    Shellfish allergy  Review of Systems   Review of Systems  All other systems reviewed and are negative.   Physical Exam Updated Vital Signs BP (!) 168/98 (BP Location: Right Arm)   Pulse 68   Temp 98.3 F (36.8 C) (Oral)   Resp 16   Ht 5\' 8"  (1.727 m)   Wt 92.5 kg   SpO2 98%   BMI 31.02 kg/m   Physical Exam Vitals and nursing note reviewed. Exam conducted with a chaperone present.  Constitutional:      General: She is not in acute distress.    Appearance: Normal appearance. She is not ill-appearing.  HENT:     Head: Normocephalic and atraumatic.  Eyes:     General: No scleral icterus.    Conjunctiva/sclera: Conjunctivae normal.  Cardiovascular:     Rate and Rhythm: Normal rate and regular rhythm.     Pulses: Normal pulses.     Comments: 2/5 blowing systolic murmur best appreciated at left upper sternal border. Pulmonary:     Effort: Pulmonary effort is normal. No respiratory distress.     Breath sounds: Normal breath sounds. No wheezing or rales.  Skin:    General: Skin is dry.     Capillary Refill: Capillary refill takes less than 2 seconds.  Neurological:     Mental Status: She is alert and oriented to person, place, and time.     GCS: GCS eye subscore is 4. GCS verbal subscore is 5. GCS motor subscore is 6.  Psychiatric:        Mood and Affect: Mood normal.        Behavior: Behavior normal.        Thought Content:  Thought content normal.     ED Results / Procedures / Treatments   Labs (all labs ordered are listed, but only abnormal results are displayed) Labs Reviewed  SARS CORONAVIRUS 2 (TAT 6-24 HRS)    EKG None  Radiology No results found.  Procedures Procedures (including critical care time)  Medications Ordered in ED Medications - No data to display  ED Course  I have reviewed the triage vital signs and the nursing notes.  Pertinent labs &  imaging results that were available during my care of the patient were reviewed by me and considered in my medical decision making (see chart for details).    MDM Rules/Calculators/A&P                      Patient symptoms are consistent with typical adverse effects associated with administration of second COVID-19 vaccine.  However, cannot rule out exposure to COVID-19 while obtaining her immunization.  Immunization is not effective until period of time afterwards and she could still potentially have contracted the illness.  I emphasized that she could not possibly get COVID-19 from the vaccine, but could have potentially been exposed during process of immunization.  Will obtain send out testing and asked that patient isolate while results are pending.  However, I explained that it is much more likely that she is simply experiencing the typical, to-be-expected adverse effects.  Patient is in no acute distress on my examination.  Her blood pressure is mildly elevated, but she states that it is chronically elevated and she needs to follow-up with her cardiologist.  Given brevity of illness, do not feel as though laboratory work-up or plain films are warranted.  No abnormal breath sounds on physical exam.  Physical exam is entirely benign.  She is hemodynamically stable and demonstrating no increased respiratory effort.  Recommend that she continue Tylenol as needed for fevers or chills.  Strict ED return precautions discussed.  Patient voiced  understanding and is agreeable to plan.  Final Clinical Impression(s) / ED Diagnoses Final diagnoses:  Generalized body aches    Rx / DC Orders ED Discharge Orders    None       Corena Herter, PA-C 02/07/20 1624    Veryl Speak, MD 02/07/20 2257

## 2020-02-07 NOTE — ED Triage Notes (Addendum)
Pt c/o cough, chills, body aches, pain to left arm injection site-sx  started 4/6 after covid vaccine #2-NAD-steady gait

## 2020-02-28 ENCOUNTER — Emergency Department (HOSPITAL_BASED_OUTPATIENT_CLINIC_OR_DEPARTMENT_OTHER)
Admission: EM | Admit: 2020-02-28 | Discharge: 2020-02-28 | Disposition: A | Discharge status: Home / Self Care | Attending: Emergency Medicine | Admitting: Emergency Medicine

## 2020-02-28 ENCOUNTER — Other Ambulatory Visit: Payer: Self-pay

## 2020-02-28 ENCOUNTER — Encounter (HOSPITAL_BASED_OUTPATIENT_CLINIC_OR_DEPARTMENT_OTHER): Payer: Self-pay | Admitting: Emergency Medicine

## 2020-02-28 DIAGNOSIS — Z87891 Personal history of nicotine dependence: Secondary | ICD-10-CM | POA: Diagnosis not present

## 2020-02-28 DIAGNOSIS — Y939 Activity, unspecified: Secondary | ICD-10-CM | POA: Diagnosis not present

## 2020-02-28 DIAGNOSIS — Y929 Unspecified place or not applicable: Secondary | ICD-10-CM | POA: Insufficient documentation

## 2020-02-28 DIAGNOSIS — Z91013 Allergy to seafood: Secondary | ICD-10-CM | POA: Insufficient documentation

## 2020-02-28 DIAGNOSIS — Z79899 Other long term (current) drug therapy: Secondary | ICD-10-CM | POA: Diagnosis not present

## 2020-02-28 DIAGNOSIS — Z23 Encounter for immunization: Secondary | ICD-10-CM | POA: Insufficient documentation

## 2020-02-28 DIAGNOSIS — I10 Essential (primary) hypertension: Secondary | ICD-10-CM | POA: Diagnosis not present

## 2020-02-28 DIAGNOSIS — W208XXA Other cause of strike by thrown, projected or falling object, initial encounter: Secondary | ICD-10-CM | POA: Diagnosis not present

## 2020-02-28 DIAGNOSIS — S0181XA Laceration without foreign body of other part of head, initial encounter: Secondary | ICD-10-CM

## 2020-02-28 DIAGNOSIS — S0990XA Unspecified injury of head, initial encounter: Secondary | ICD-10-CM

## 2020-02-28 DIAGNOSIS — Y990 Civilian activity done for income or pay: Secondary | ICD-10-CM | POA: Diagnosis not present

## 2020-02-28 DIAGNOSIS — I4891 Unspecified atrial fibrillation: Secondary | ICD-10-CM | POA: Diagnosis not present

## 2020-02-28 MED ORDER — TETANUS-DIPHTH-ACELL PERTUSSIS 5-2.5-18.5 LF-MCG/0.5 IM SUSP
0.5000 mL | Freq: Once | INTRAMUSCULAR | Status: AC
  Administered 2020-02-28: 18:00:00 0.5 mL via INTRAMUSCULAR
  Filled 2020-02-28: qty 0.5

## 2020-02-28 NOTE — ED Triage Notes (Signed)
States she was in the head with a piece of equipment at work. Small lac noted to corner of L eye.

## 2020-02-28 NOTE — ED Notes (Signed)
ED Provider at bedside. 

## 2020-02-29 NOTE — ED Provider Notes (Signed)
[facility] EMERGENCY DEPARTMENT Provider Note   CSN: [id number] Arrival date & time: 02/28/20  1526     History Chief Complaint  Patient presents with  . Head Injury    Jill Escobar is a 64 y.o. female.  HPI      64 year old female with a history of atrial fibrillation, hypertension, presents with concern for head injury.  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  Incident occurred 4 hours ago.  She had no syncope, no nausea vomiting.  No numbness, weakness, neck pain, nor visual changes.  Does report a headache which is moderate.  Has irritation to the left of her eye.  She is not up-to-date on her tetanus vaccine.  Denies other concerns.  She is no longer on anticoagulation.    Past Medical History:  Diagnosis Date  . Atrial fibrillation ([place])   . HTN (hypertension)   . Hypokalemia   . Osteoarthritis    L hip  . Overweight     Patient Active Problem List   Diagnosis Date Noted  . Skin lesion of upper extremity 05/02/2018  . A-fib ([place]) 06/17/2015  . Abnormal echocardiogram 04/09/2015  . HYPOKALEMIA 05/13/2010  . Essential hypertension 05/13/2010  . Atrial fibrillation ([place]) 05/13/2010  . OSTEOARTHRITIS, HIP, LEFT 05/13/2010  . History of cardiovascular disorder 05/13/2010    Past Surgical History:  Procedure Laterality Date  . CARDIAC ELECTROPHYSIOLOGY STUDY AND ABLATION    . D&C and removal of endonetrial polyp    . drainage of cyst     follicular functional-type of cysyt- L ovary   . ELECTROPHYSIOLOGIC STUDY N/A 06/17/2015   Procedure: Atrial Fibrillation Ablation;  Surgeon: Thompson Grayer, MD;  Location: [facility] CV LAB;  Service: Cardiovascular;  Laterality: N/A;  . JOINT REPLACEMENT Left 2012   hip  . LAPAROSCOPY     with lysis of pelvic adhesions   . LOOP RECORDER INSERTION N/A 09/27/2017   Procedure: LOOP RECORDER  INSERTION;  Surgeon: Thompson Grayer, MD;  Location: [facility] CV LAB;  Service: Cardiovascular;  Laterality: N/A;  . MASS EXCISION Right 05/02/2018   Procedure: EXCISION OF SKIN LESION ON RIGHT UPPER ARM;  Surgeon: Armandina Gemma, MD;  Location: [facility];  Service: General;  Laterality: Right;  . NASAL ENDOSCOPY     upper  . TEE WITHOUT CARDIOVERSION N/A 04/14/2015   Procedure: TRANSESOPHAGEAL ECHOCARDIOGRAM (TEE);  Surgeon: Jerline Pain, MD;  Location: [facility];  Service: Cardiovascular;  Laterality: N/A;  . TEE WITHOUT CARDIOVERSION N/A 06/17/2015   Procedure: TRANSESOPHAGEAL ECHOCARDIOGRAM (TEE);  Surgeon: Skeet Latch, MD;  Location: [facility] ENDOSCOPY;  Service: Cardiovascular;  Laterality: N/A;  . uterosacral nerve ablation     with bipolar coagulation. Surgeon Dr. Nori Riis  . VESICOVAGINAL FISTULA CLOSURE W/ TAH       OB History   No obstetric history on file.     Family History  Problem Relation Age of Onset  . Cancer Father   . Other Mother        MVA    Social History   Tobacco Use  . Smoking status: Former Research scientist (life sciences)  . Smokeless tobacco: Never Used  . Tobacco comment: has a 5 pack year hx. quit 4 years ago.   Substance Use Topics  . Alcohol use: Yes    Comment: occasional   . Drug use: No  Home Medications Prior to Admission medications   Medication Sig Start Date End Date Taking? Authorizing Provider  acetaminophen (TYLENOL) 500 MG tablet Take 1,000 mg by mouth at bedtime as needed (pain).    [provider]  CARTIA XT 240 MG 24 hr capsule Take 1 capsule by mouth once daily 12/13/19   Allred, Fayrene Fearing, MD  diltiazem (CARDIZEM) 30 MG tablet Take 1 tablet every 4 hours AS NEEDED for AFIB heart rate >100 as long as blood pressure >100. 09/07/19   Newman Nip, NP  doxylamine, Sleep, (UNISOM) 25 MG tablet Take 25 mg by mouth at bedtime as needed for sleep.    [provider]  methocarbamol (ROBAXIN) 500 MG tablet Take 1 tablet (500 mg  total) by mouth 2 (two) times daily. 10/04/19   Mannie Stabile, PA-C  Multiple Vitamin (MULTIVITAMIN WITH MINERALS) TABS tablet Take 1 tablet by mouth daily.    [provider]  telmisartan-hydrochlorothiazide (MICARDIS HCT) 80-25 MG tablet Take 1 tablet by mouth once daily 12/13/19   Allred, Fayrene Fearing, MD    Allergies    Shellfish allergy  Review of Systems   Review of Systems  Constitutional: Negative for fever.  Eyes: Negative for visual disturbance.  Respiratory: Negative for cough and shortness of breath.   Cardiovascular: Negative for chest pain.  Gastrointestinal: Negative for abdominal pain, nausea and vomiting.  Musculoskeletal: Negative for back pain and neck pain.  Skin: Positive for wound. Negative for rash.  Neurological: Positive for headaches. Negative for syncope, weakness and numbness.    Physical Exam Updated Vital Signs BP (!) 181/101 (BP Location: Left Arm)   Pulse 72   Temp 98.3 F (36.8 C) (Oral)   Resp 14   Ht 5\' 8"  (1.727 m)   Wt 90.7 kg   SpO2 100%   BMI 30.41 kg/m   Physical Exam Constitutional:      General: She is not in acute distress.    Appearance: Normal appearance. She is not ill-appearing.  HENT:     Head: Normocephalic.     Comments: Tenderness inferior and lateral to orbit left eye Superficial laceration/abrasion lateral to eye .5cm  Eyes:     General: No visual field deficit.    Extraocular Movements: Extraocular movements intact.     Conjunctiva/sclera: Conjunctivae normal.     Pupils: Pupils are equal, round, and reactive to light.  Cardiovascular:     Rate and Rhythm: Normal rate and regular rhythm.     Pulses: Normal pulses.  Pulmonary:     Effort: Pulmonary effort is normal. No respiratory distress.  Musculoskeletal:        General: No swelling or tenderness.     Cervical back: Normal range of motion.  Skin:    General: Skin is warm and dry.     Findings: No erythema or rash.  Neurological:     General: No  focal deficit present.     Mental Status: She is alert and oriented to person, place, and time.     GCS: GCS eye subscore is 4. GCS verbal subscore is 5. GCS motor subscore is 6.     Cranial Nerves: No cranial nerve deficit, dysarthria or facial asymmetry.     Sensory: No sensory deficit.     Motor: No weakness or tremor.     Coordination: Coordination normal. Finger-Nose-Finger Test normal.     Gait: Gait normal.     ED Results / Procedures / Treatments   Labs (all labs ordered  are listed, but only abnormal results are displayed) Labs Reviewed - No data to display  EKG None  Radiology No results found.  Procedures Procedures (including critical care time)  Medications Ordered in ED Medications  Tdap (BOOSTRIX) injection 0.5 mL (0.5 mLs Intramuscular Given 02/28/20 1807)    ED Course  I have reviewed the triage vital signs and the nursing notes.  Pertinent labs & imaging results that were available during my care of the patient were reviewed by me and considered in my medical decision making (see chart for details).    MDM Rules/Calculators/A&P                      64 year old female with a history of atrial fibrillation, hypertension, presents with concern for head injury.  Low suspicion for intracranial bleed by [facility] CT rules. She has not been on anticoagulation for 6 months. Normal vision, normal EOM, doubt complicated facial fractures that would require intervention.  Tetanus vaccine updated. Superficial wound does not require repair. Recommend tylenol for headache. Patient discharged in stable condition with understanding of reasons to return.    Final Clinical Impression(s) / ED Diagnoses Final diagnoses:  Injury of head, initial encounter  Facial laceration, initial encounter    Rx / DC Orders ED Discharge Orders    None       Alvira Monday, MD 03/01/20 1341

## 2020-03-06 LAB — CUP PACEART REMOTE DEVICE CHECK
Date Time Interrogation Session: 20210502003353
Implantable Pulse Generator Implant Date: 20181127

## 2020-03-10 ENCOUNTER — Ambulatory Visit (INDEPENDENT_AMBULATORY_CARE_PROVIDER_SITE_OTHER): Payer: Federal, State, Local not specified - PPO | Admitting: *Deleted

## 2020-03-10 DIAGNOSIS — I4891 Unspecified atrial fibrillation: Secondary | ICD-10-CM | POA: Diagnosis not present

## 2020-03-10 NOTE — Progress Notes (Signed)
Carelink Summary Report / Loop Recorder 

## 2020-03-24 ENCOUNTER — Other Ambulatory Visit: Payer: Self-pay | Admitting: Internal Medicine

## 2020-03-24 DIAGNOSIS — I480 Paroxysmal atrial fibrillation: Secondary | ICD-10-CM

## 2020-03-24 DIAGNOSIS — I10 Essential (primary) hypertension: Secondary | ICD-10-CM

## 2020-04-14 ENCOUNTER — Ambulatory Visit (INDEPENDENT_AMBULATORY_CARE_PROVIDER_SITE_OTHER): Payer: Federal, State, Local not specified - PPO | Admitting: *Deleted

## 2020-04-14 DIAGNOSIS — I4891 Unspecified atrial fibrillation: Secondary | ICD-10-CM | POA: Diagnosis not present

## 2020-04-14 LAB — CUP PACEART REMOTE DEVICE CHECK
Date Time Interrogation Session: 20210613232850
Implantable Pulse Generator Implant Date: 20181127

## 2020-04-14 NOTE — Progress Notes (Signed)
Carelink Summary Report / Loop Recorder 

## 2020-05-19 ENCOUNTER — Ambulatory Visit (INDEPENDENT_AMBULATORY_CARE_PROVIDER_SITE_OTHER): Payer: Federal, State, Local not specified - PPO | Admitting: *Deleted

## 2020-05-19 DIAGNOSIS — I4891 Unspecified atrial fibrillation: Secondary | ICD-10-CM

## 2020-05-19 LAB — CUP PACEART REMOTE DEVICE CHECK
Date Time Interrogation Session: 20210718230559
Implantable Pulse Generator Implant Date: 20181127

## 2020-05-20 NOTE — Progress Notes (Signed)
Carelink Summary Report / Loop Recorder 

## 2020-06-23 ENCOUNTER — Telehealth: Payer: Self-pay

## 2020-06-23 ENCOUNTER — Ambulatory Visit (INDEPENDENT_AMBULATORY_CARE_PROVIDER_SITE_OTHER): Payer: Federal, State, Local not specified - PPO | Admitting: *Deleted

## 2020-06-23 DIAGNOSIS — I4891 Unspecified atrial fibrillation: Secondary | ICD-10-CM | POA: Diagnosis not present

## 2020-06-23 LAB — CUP PACEART REMOTE DEVICE CHECK
Date Time Interrogation Session: 20210820230937
Implantable Pulse Generator Implant Date: 20181127

## 2020-06-23 NOTE — Telephone Encounter (Signed)
Alert for tachy event 180's. Can't ruleout AF w/ RVR. 19 AF events. No OAC noted.   Spoke with pt, she was aware of episode yesterday, states she was resting in bed at onset of heart racing.  She denies any CP, dizziness or SOB.  She took PRN dose of Diltiazem and symptoms resolved.    Today pt reportedly feels fine, denies any cardiac symptoms.  She confirmed compliance with meds as ordered including Diltiazem 240mg  daily, with PRN dose of 30mg  every 4 hours as needed.  Pt noted to have CHADS Vasc of 2. S/P AF ablation 06/2015.    Pt has recall in for next OV in 12/2020  Advised would forward to MD for review, anticipate no changes, if any changes are recommended we will call her.

## 2020-06-23 NOTE — Progress Notes (Signed)
Carelink Summary Report / Loop Recorder 

## 2020-07-25 LAB — CUP PACEART REMOTE DEVICE CHECK
Date Time Interrogation Session: 20210922232851
Implantable Pulse Generator Implant Date: 20181127

## 2020-07-28 ENCOUNTER — Ambulatory Visit (INDEPENDENT_AMBULATORY_CARE_PROVIDER_SITE_OTHER): Payer: Federal, State, Local not specified - PPO | Admitting: Emergency Medicine

## 2020-07-28 DIAGNOSIS — I4891 Unspecified atrial fibrillation: Secondary | ICD-10-CM | POA: Diagnosis not present

## 2020-07-29 NOTE — Progress Notes (Signed)
Carelink Summary Report / Loop Recorder 

## 2020-08-21 ENCOUNTER — Ambulatory Visit: Payer: Federal, State, Local not specified - PPO | Attending: Internal Medicine

## 2020-08-21 DIAGNOSIS — Z23 Encounter for immunization: Secondary | ICD-10-CM

## 2020-08-21 NOTE — Progress Notes (Signed)
   Covid-19 Vaccination Clinic  Name:  Jill Escobar    MRN: [id number] DOB: Dec 18, 1955  08/21/2020  Jill Escobar was observed post Covid-19 immunization for 15 minutes without incident. She was provided with Vaccine Information Sheet and instruction to access the V-Safe system.   Jill Escobar was instructed to call 911 with any severe reactions post vaccine: Marland Kitchen Difficulty breathing  . Swelling of face and throat  . A fast heartbeat  . A bad rash all over body  . Dizziness and weakness

## 2020-08-30 LAB — CUP PACEART REMOTE DEVICE CHECK
Date Time Interrogation Session: 20211026000044
Implantable Pulse Generator Implant Date: 20181127

## 2020-09-01 ENCOUNTER — Ambulatory Visit (INDEPENDENT_AMBULATORY_CARE_PROVIDER_SITE_OTHER): Payer: Federal, State, Local not specified - PPO

## 2020-09-01 DIAGNOSIS — I4891 Unspecified atrial fibrillation: Secondary | ICD-10-CM | POA: Diagnosis not present

## 2020-09-03 NOTE — Progress Notes (Signed)
Carelink Summary Report / Loop Recorder 

## 2020-10-03 LAB — CUP PACEART REMOTE DEVICE CHECK
Date Time Interrogation Session: 20211127231243
Implantable Pulse Generator Implant Date: 20181127

## 2020-10-06 ENCOUNTER — Ambulatory Visit (INDEPENDENT_AMBULATORY_CARE_PROVIDER_SITE_OTHER): Payer: Federal, State, Local not specified - PPO

## 2020-10-06 DIAGNOSIS — I4891 Unspecified atrial fibrillation: Secondary | ICD-10-CM | POA: Diagnosis not present

## 2020-10-15 NOTE — Progress Notes (Signed)
Carelink Summary Report / Loop Recorder 

## 2020-11-09 LAB — CUP PACEART REMOTE DEVICE CHECK
Date Time Interrogation Session: 20220108231132
Implantable Pulse Generator Implant Date: 20181127

## 2020-11-10 ENCOUNTER — Ambulatory Visit (INDEPENDENT_AMBULATORY_CARE_PROVIDER_SITE_OTHER): Payer: Federal, State, Local not specified - PPO

## 2020-11-10 DIAGNOSIS — I4891 Unspecified atrial fibrillation: Secondary | ICD-10-CM | POA: Diagnosis not present

## 2020-11-21 NOTE — Progress Notes (Signed)
Carelink Summary Report / Loop Recorder 

## 2020-12-12 LAB — CUP PACEART REMOTE DEVICE CHECK
Date Time Interrogation Session: 20220210232923
Implantable Pulse Generator Implant Date: 20181127

## 2020-12-15 ENCOUNTER — Ambulatory Visit (INDEPENDENT_AMBULATORY_CARE_PROVIDER_SITE_OTHER): Payer: Federal, State, Local not specified - PPO

## 2020-12-15 DIAGNOSIS — I4891 Unspecified atrial fibrillation: Secondary | ICD-10-CM | POA: Diagnosis not present

## 2020-12-18 NOTE — Progress Notes (Signed)
Carelink Summary Report / Loop Recorder 

## 2020-12-24 ENCOUNTER — Telehealth: Payer: Self-pay | Admitting: Internal Medicine

## 2020-12-24 DIAGNOSIS — I480 Paroxysmal atrial fibrillation: Secondary | ICD-10-CM

## 2020-12-24 DIAGNOSIS — I10 Essential (primary) hypertension: Secondary | ICD-10-CM

## 2020-12-24 MED ORDER — DILTIAZEM HCL 30 MG PO TABS: ORAL_TABLET | ORAL | 0 refills | Status: DC

## 2020-12-24 MED ORDER — TELMISARTAN-HCTZ 80-25 MG PO TABS: ORAL_TABLET | ORAL | 0 refills | Status: DC

## 2020-12-24 NOTE — Telephone Encounter (Signed)
Pt's medications were sent to pt's pharmacy as requested. Confirmation received.  

## 2020-12-24 NOTE — Telephone Encounter (Signed)
°*  STAT* If patient is at the pharmacy, call can be transferred to refill team.   1. Which medications need to be refilled? (please list name of each medication and dose if known)  diltiazem (CARDIZEM) 30 MG tablet telmisartan-hydrochlorothiazide (MICARDIS HCT) 80-25 MG tablet  2. Which pharmacy/location (including street and city if local pharmacy) is medication to be sent to? [facility] [address] [place] - [address]  3. Do they need a 30 day or 90 day supply? 30   Patient scheduled for appt with Dr. Johney Frame 03.10.22

## 2021-01-08 ENCOUNTER — Other Ambulatory Visit: Payer: Self-pay

## 2021-01-08 ENCOUNTER — Encounter: Payer: Self-pay | Admitting: Internal Medicine

## 2021-01-08 ENCOUNTER — Ambulatory Visit: Payer: Federal, State, Local not specified - PPO | Admitting: Internal Medicine

## 2021-01-08 VITALS — BP 166/112 | HR 76 | Ht 68.0 in | Wt 207.4 lb

## 2021-01-08 DIAGNOSIS — R0683 Snoring: Secondary | ICD-10-CM | POA: Diagnosis not present

## 2021-01-08 DIAGNOSIS — I480 Paroxysmal atrial fibrillation: Secondary | ICD-10-CM | POA: Diagnosis not present

## 2021-01-08 DIAGNOSIS — I10 Essential (primary) hypertension: Secondary | ICD-10-CM

## 2021-01-08 DIAGNOSIS — R0602 Shortness of breath: Secondary | ICD-10-CM | POA: Diagnosis not present

## 2021-01-08 MED ORDER — TELMISARTAN-HCTZ 80-25 MG PO TABS: ORAL_TABLET | ORAL | 3 refills | Status: DC

## 2021-01-08 MED ORDER — METOPROLOL TARTRATE 100 MG PO TABS: 100.0000 mg | ORAL_TABLET | Freq: Once | ORAL | 0 refills | Status: DC | PRN

## 2021-01-08 MED ORDER — DILTIAZEM HCL ER COATED BEADS 240 MG PO CP24: 240.0000 mg | ORAL_CAPSULE | Freq: Every day | ORAL | 3 refills | Status: DC

## 2021-01-08 NOTE — Progress Notes (Signed)
PCP: Georgianne Fick, MD   Primary EP: Dr Johney Frame  Jill Escobar is a 65 y.o. female who presents today for routine electrophysiology followup.  Since last being seen in our clinic, the patient reports doing reasonably well.  She has noticed episodes of afib.  These occur more commonly at night.  + palpitations and fatigue.  She snores.  Does not feel well rested in the AM.  + exertional SOB frequently.  Today, she denies symptoms of chest pain, lower extremity edema, dizziness, presyncope, or syncope.  The patient is otherwise without complaint today.   Past Medical History:  Diagnosis Date  . Atrial fibrillation (HCC)   . HTN (hypertension)   . Hypokalemia   . Osteoarthritis    L hip  . Overweight    Past Surgical History:  Procedure Laterality Date  . CARDIAC ELECTROPHYSIOLOGY STUDY AND ABLATION    . D&C and removal of endonetrial polyp    . drainage of cyst     follicular functional-type of cysyt- L ovary   . ELECTROPHYSIOLOGIC STUDY N/A 06/17/2015   Procedure: Atrial Fibrillation Ablation;  Surgeon: Hillis Range, MD;  Location: [facility] INVASIVE CV LAB;  Service: Cardiovascular;  Laterality: N/A;  . JOINT REPLACEMENT Left 2012   hip  . LAPAROSCOPY     with lysis of pelvic adhesions   . LOOP RECORDER INSERTION N/A 09/27/2017   Procedure: LOOP RECORDER INSERTION;  Surgeon: Hillis Range, MD;  Location: [facility] INVASIVE CV LAB;  Service: Cardiovascular;  Laterality: N/A;  . MASS EXCISION Right 05/02/2018   Procedure: EXCISION OF SKIN LESION ON RIGHT UPPER ARM;  Surgeon: Darnell Level, MD;  Location: [facility];  Service: General;  Laterality: Right;  . NASAL ENDOSCOPY     upper  . TEE WITHOUT CARDIOVERSION N/A 04/14/2015   Procedure: TRANSESOPHAGEAL ECHOCARDIOGRAM (TEE);  Surgeon: Jake Bathe, MD;  Location: [facility] ENDOSCOPY;  Service: Cardiovascular;  Laterality: N/A;  . TEE WITHOUT CARDIOVERSION N/A 06/17/2015   Procedure: TRANSESOPHAGEAL ECHOCARDIOGRAM (TEE);  Surgeon:  Chilton Si, MD;  Location: [facility] ENDOSCOPY;  Service: Cardiovascular;  Laterality: N/A;  . uterosacral nerve ablation     with bipolar coagulation. Surgeon Dr. Jennette Kettle  . VESICOVAGINAL FISTULA CLOSURE W/ TAH      ROS- all systems are reviewed and negatives except as per HPI above  Current Outpatient Medications  Medication Sig Dispense Refill  . acetaminophen (TYLENOL) 500 MG tablet Take 1,000 mg by mouth at bedtime as needed (pain).    [name] XT 240 MG 24 hr capsule Take 1 capsule by mouth once daily 90 capsule 2  . diltiazem (CARDIZEM) 30 MG tablet Take 1 tablet every 4 hours AS NEEDED for AFIB heart rate >100 as long as blood pressure >100. Please keep upcoming appt in March 2022 with Dr. Johney Frame before anymore refills. Thank you 15 tablet 0  . doxylamine, Sleep, (UNISOM) 25 MG tablet Take 25 mg by mouth at bedtime as needed for sleep.    . methocarbamol (ROBAXIN) 500 MG tablet Take 1 tablet (500 mg total) by mouth 2 (two) times daily. 20 tablet 0  . Multiple Vitamin (MULTIVITAMIN WITH MINERALS) TABS tablet Take 1 tablet by mouth daily.    Marland Kitchen telmisartan-hydrochlorothiazide (MICARDIS HCT) 80-25 MG tablet TAKE 1 TABLET BY MOUTH ONCE DAILY. Please keep upcoming appt in March 2022 with Dr. Johney Frame before anymore refills. Thank you 30 tablet 0   No current facility-administered medications for this visit.    Physical Exam: Vitals:  01/08/21 0840  BP: (!) 166/112  Pulse: 76  SpO2: 98%  Weight: 207 lb 6.4 oz (94.1 kg)  Height: 5\' 8"  (1.727 m)    GEN- The patient is well appearing, alert and oriented x 3 today.   Head- normocephalic, atraumatic Eyes-  Sclera clear, conjunctiva pink Ears- hearing intact Oropharynx- clear Lungs- Clear to ausculation bilaterally, normal work of breathing Heart- Regular rate and rhythm, no murmurs, rubs or gallops, PMI not laterally displaced GI- soft, NT, ND, + BS Extremities- no clubbing, cyanosis, or edema  Wt Readings from Last 3 Encounters:   01/08/21 207 lb 6.4 oz (94.1 kg)  02/28/20 200 lb (90.7 kg)  02/07/20 204 lb (92.5 kg)    EKG tracing ordered today is personally reviewed and shows sinus  Assessment and Plan:  1. Paroxysmal atrial fibrillation Reasonably well controlled post ablation off AAD therapy  AF burden by ILR is 0.3% She has episodes of afib and atrial flutter documented on ILR.  She rarely takes diltiazem.  She is not interested in AAD or ablation at this time. chads2vasc score is 2.  As per 2019 guideline update, she does not require OAC currently  2. HTN Stable No change required today  3. Overweight Stable No change required today  4. SOB Unclear etiology Echo and cardiac CT with FFR are ordered today to further evaluate SOB  5. snoring Refer to Dr 2020 of sleep management  Risks, benefits and potential toxicities for medications prescribed and/or refilled reviewed with patient today.   Return to see EP PA in 3 months  Earl Gala MD, [facility] 01/08/2021 8:40 AM

## 2021-01-08 NOTE — Patient Instructions (Addendum)
Medication Instructions:  Your physician recommends that you continue on your current medications as directed. Please refer to the Current Medication list given to you today.  Labwork: None ordered.  Testing/Procedures: please schedule CT & Echo  Your physician has requested that you have an echocardiogram. Echocardiography is a painless test that uses sound waves to create images of your heart. It provides your doctor with information about the size and shape of your heart and how well your heart's chambers and valves are working. This procedure takes approximately one hour. There are no restrictions for this procedure. Your physician has requested that you have cardiac CT. Cardiac computed tomography (CT) is a painless test that uses an x-ray machine to take clear, detailed pictures of your heart. For further information please visit [email or web address] Please follow instruction sheet as given.   Follow-Up:  Your physician wants you to follow-up in: 3 months with  Casimiro Needle "Mardelle Matte" Lanna Poche, PA-C     Any Other Special Instructions Will Be Listed Below (If Applicable).  If you need a refill on your cardiac medications before your next appointment, please call your pharmacy.        Cardiac CT   [facility] [address] [place], [place] [address] [phone]  Please arrive at the [facility] main entrance (entrance A) of [facility] 30 minutes prior to test start time. Proceed to the [facility] Radiology Department (first floor) to check-in and test prep.  Please follow these instructions carefully (unless otherwise directed):  On the Night Before the Test: . Be sure to Drink plenty of water. . Do not consume any caffeinated/decaffeinated beverages or chocolate 12 hours prior to your test. . Do not take any antihistamines 12 hours prior to your test.  On the Day of the Test: . Drink plenty of water until 1 hour prior to the test. . Do not eat any food 4  hours prior to the test. . You may take your regular medications prior to the test.  . Take metoprolol (Lopressor) two hours prior to test. o -If HR is less than 55 -No Lopressor, if HR is greater than 55 -Take Lopressor . FEMALES- please wear underwire-free bra if available      After the Test: . Drink plenty of water. . After receiving IV contrast, you may experience a mild flushed feeling. This is normal. . On occasion, you may experience a mild rash up to 24 hours after the test. This is not dangerous. If this occurs, you can take Benadryl 25 mg and increase your fluid intake. . If you experience trouble breathing, this can be serious. If it is severe call 911 IMMEDIATELY. If it is mild, please call our office.   Once we have confirmed authorization from your insurance company, we will call you to set up a date and time for your test. Based on how quickly your insurance processes prior authorizations requests, please allow up to 4 weeks to be contacted for scheduling your Cardiac CT appointment. Be advised that routine Cardiac CT appointments could be scheduled as many as 8 weeks after your provider has ordered it.  For non-scheduling related questions, please contact the cardiac imaging nurse navigator should you have any questions/concerns: Rockwell Alexandria, Cardiac Imaging Nurse Navigator Larey Brick, Cardiac Imaging Nurse Navigator [facility] Direct Office Dial: [phone]   For scheduling needs, including cancellations and rescheduling, please call [place], [phone].

## 2021-01-15 LAB — CUP PACEART REMOTE DEVICE CHECK
Date Time Interrogation Session: 20220316003113
Implantable Pulse Generator Implant Date: 20181127

## 2021-01-19 ENCOUNTER — Ambulatory Visit (INDEPENDENT_AMBULATORY_CARE_PROVIDER_SITE_OTHER): Payer: Federal, State, Local not specified - PPO

## 2021-01-19 DIAGNOSIS — I4891 Unspecified atrial fibrillation: Secondary | ICD-10-CM | POA: Diagnosis not present

## 2021-01-20 ENCOUNTER — Other Ambulatory Visit: Payer: Self-pay

## 2021-01-20 ENCOUNTER — Other Ambulatory Visit: Payer: Federal, State, Local not specified - PPO | Admitting: *Deleted

## 2021-01-20 DIAGNOSIS — I480 Paroxysmal atrial fibrillation: Secondary | ICD-10-CM | POA: Diagnosis not present

## 2021-01-20 DIAGNOSIS — R0602 Shortness of breath: Secondary | ICD-10-CM

## 2021-01-20 DIAGNOSIS — R0683 Snoring: Secondary | ICD-10-CM

## 2021-01-20 DIAGNOSIS — I10 Essential (primary) hypertension: Secondary | ICD-10-CM | POA: Diagnosis not present

## 2021-01-21 ENCOUNTER — Telehealth (HOSPITAL_COMMUNITY): Payer: Self-pay | Admitting: *Deleted

## 2021-01-21 LAB — BASIC METABOLIC PANEL
BUN/Creatinine Ratio: 13 (ref 12–28)
BUN: 13 mg/dL (ref 8–27)
CO2: 26 mmol/L (ref 20–29)
Calcium: 9.6 mg/dL (ref 8.7–10.3)
Chloride: 102 mmol/L (ref 96–106)
Creatinine, Ser: 1.02 mg/dL — ABNORMAL HIGH (ref 0.57–1.00)
Glucose: 87 mg/dL (ref 65–99)
Potassium: 4.5 mmol/L (ref 3.5–5.2)
Sodium: 144 mmol/L (ref 134–144)
eGFR: 62 mL/min/{1.73_m2} (ref >59)

## 2021-01-21 NOTE — Telephone Encounter (Signed)
Reaching out to patient to offer assistance regarding upcoming cardiac imaging study; pt verbalizes understanding of appt date/time, parking situation and where to check in, pre-test NPO status and medications ordered, and verified current allergies; name and call back number provided for further questions should they arise  Aristeo Hankerson RN Navigator Cardiac Imaging [facility] [phone] office [phone] cell  

## 2021-01-22 ENCOUNTER — Telehealth (HOSPITAL_COMMUNITY): Payer: Self-pay | Admitting: *Deleted

## 2021-01-22 MED ORDER — METOPROLOL TARTRATE 100 MG PO TABS: 100.0000 mg | ORAL_TABLET | Freq: Once | ORAL | 0 refills | Status: DC | PRN

## 2021-01-22 NOTE — Telephone Encounter (Signed)
Pt calling to say that the pharmacy doesn't have metoprolol prescription for cardiac CT test.  Re-sending order.  Dorette Grate Office: [phone]

## 2021-01-23 ENCOUNTER — Ambulatory Visit (HOSPITAL_COMMUNITY)
Admission: RE | Admit: 2021-01-23 | Discharge: 2021-01-23 | Disposition: A | Discharge status: Home / Self Care | Payer: Federal, State, Local not specified - PPO | Source: Ambulatory Visit | Attending: Internal Medicine | Admitting: Internal Medicine

## 2021-01-23 ENCOUNTER — Other Ambulatory Visit: Payer: Self-pay

## 2021-01-23 DIAGNOSIS — R0602 Shortness of breath: Secondary | ICD-10-CM | POA: Insufficient documentation

## 2021-01-23 DIAGNOSIS — R0683 Snoring: Secondary | ICD-10-CM | POA: Insufficient documentation

## 2021-01-23 DIAGNOSIS — I700 Atherosclerosis of aorta: Secondary | ICD-10-CM | POA: Diagnosis not present

## 2021-01-23 DIAGNOSIS — I480 Paroxysmal atrial fibrillation: Secondary | ICD-10-CM | POA: Diagnosis not present

## 2021-01-23 DIAGNOSIS — I10 Essential (primary) hypertension: Secondary | ICD-10-CM | POA: Diagnosis not present

## 2021-01-23 MED ORDER — IOHEXOL 350 MG/ML SOLN
80.0000 mL | Freq: Once | INTRAVENOUS | Status: AC | PRN
  Administered 2021-01-23: 80 mL via INTRAVENOUS

## 2021-01-23 MED ORDER — NITROGLYCERIN 0.4 MG SL SUBL
0.8000 mg | SUBLINGUAL_TABLET | Freq: Once | SUBLINGUAL | Status: AC
  Administered 2021-01-23: 0.8 mg via SUBLINGUAL

## 2021-01-23 MED ORDER — METOPROLOL TARTRATE 5 MG/5ML IV SOLN
INTRAVENOUS | Status: AC
  Filled 2021-01-23: qty 10

## 2021-01-23 MED ORDER — NITROGLYCERIN 0.4 MG SL SUBL
SUBLINGUAL_TABLET | SUBLINGUAL | Status: AC
  Filled 2021-01-23: qty 2

## 2021-01-24 DIAGNOSIS — I700 Atherosclerosis of aorta: Secondary | ICD-10-CM | POA: Diagnosis not present

## 2021-01-26 NOTE — Progress Notes (Signed)
Carelink Summary Report / Loop Recorder 

## 2021-02-02 ENCOUNTER — Encounter (HOSPITAL_COMMUNITY): Payer: Self-pay | Admitting: Internal Medicine

## 2021-02-02 ENCOUNTER — Other Ambulatory Visit (HOSPITAL_COMMUNITY): Payer: Federal, State, Local not specified - PPO

## 2021-02-10 ENCOUNTER — Telehealth (HOSPITAL_COMMUNITY): Payer: Self-pay | Admitting: Internal Medicine

## 2021-02-10 NOTE — Telephone Encounter (Signed)
Just an FYI. We have made several attempts to contact this patient including sending a letter to schedule or reschedule their echocardiogram. We will be removing the patient from the echo WQ.  02/02/21 NO SHOW-MAILED LETTER [facility]     Thank you

## 2021-02-16 ENCOUNTER — Ambulatory Visit (INDEPENDENT_AMBULATORY_CARE_PROVIDER_SITE_OTHER): Payer: Federal, State, Local not specified - PPO

## 2021-02-16 DIAGNOSIS — I4891 Unspecified atrial fibrillation: Secondary | ICD-10-CM

## 2021-02-18 LAB — CUP PACEART REMOTE DEVICE CHECK
Date Time Interrogation Session: 20220418010216
Implantable Pulse Generator Implant Date: 20181127

## 2021-03-03 NOTE — Progress Notes (Signed)
Carelink Summary Report / Loop Recorder 

## 2021-03-05 NOTE — Addendum Note (Signed)
Addended by: Geralyn Flash D on: 03/05/2021 10:14 AM   Modules accepted: Level of Service

## 2021-03-23 ENCOUNTER — Ambulatory Visit (INDEPENDENT_AMBULATORY_CARE_PROVIDER_SITE_OTHER): Payer: Federal, State, Local not specified - PPO

## 2021-03-23 DIAGNOSIS — I4891 Unspecified atrial fibrillation: Secondary | ICD-10-CM

## 2021-03-24 LAB — CUP PACEART REMOTE DEVICE CHECK
Date Time Interrogation Session: 20220521010111
Implantable Pulse Generator Implant Date: 20181127

## 2021-04-06 ENCOUNTER — Encounter: Payer: Federal, State, Local not specified - PPO | Admitting: Student

## 2021-04-10 NOTE — Progress Notes (Signed)
Carelink Summary Report / Loop Recorder 

## 2021-04-23 LAB — CUP PACEART REMOTE DEVICE CHECK
Date Time Interrogation Session: 20220623010908
Implantable Pulse Generator Implant Date: 20181127

## 2021-04-27 ENCOUNTER — Ambulatory Visit (INDEPENDENT_AMBULATORY_CARE_PROVIDER_SITE_OTHER): Payer: Federal, State, Local not specified - PPO

## 2021-04-27 DIAGNOSIS — I4891 Unspecified atrial fibrillation: Secondary | ICD-10-CM | POA: Diagnosis not present

## 2021-05-13 NOTE — Progress Notes (Signed)
Carelink Summary Report / Loop Recorder 

## 2021-05-28 ENCOUNTER — Ambulatory Visit (INDEPENDENT_AMBULATORY_CARE_PROVIDER_SITE_OTHER): Payer: Federal, State, Local not specified - PPO

## 2021-05-28 DIAGNOSIS — I4891 Unspecified atrial fibrillation: Secondary | ICD-10-CM

## 2021-05-28 LAB — CUP PACEART REMOTE DEVICE CHECK
Date Time Interrogation Session: 20220726010926
Implantable Pulse Generator Implant Date: 20181127

## 2021-06-23 NOTE — Progress Notes (Signed)
Carelink Summary Report / Loop Recorder 

## 2021-06-30 ENCOUNTER — Ambulatory Visit (INDEPENDENT_AMBULATORY_CARE_PROVIDER_SITE_OTHER): Payer: Federal, State, Local not specified - PPO

## 2021-06-30 DIAGNOSIS — I4891 Unspecified atrial fibrillation: Secondary | ICD-10-CM

## 2021-06-30 LAB — CUP PACEART REMOTE DEVICE CHECK
Date Time Interrogation Session: 20220828011353
Implantable Pulse Generator Implant Date: 20181127

## 2021-07-13 NOTE — Progress Notes (Signed)
Carelink Summary Report / Loop Recorder 

## 2021-07-20 DIAGNOSIS — M25552 Pain in left hip: Secondary | ICD-10-CM | POA: Diagnosis not present

## 2021-07-20 DIAGNOSIS — M25561 Pain in right knee: Secondary | ICD-10-CM | POA: Diagnosis not present

## 2021-07-23 ENCOUNTER — Telehealth: Payer: Self-pay

## 2021-07-23 NOTE — Telephone Encounter (Signed)
Carelink alert received- ILR has reached RRT.    Attempted to reach patient, no answer/ mailbox is full not accepting messages.  Mychart message sent to patient.

## 2021-11-18 DIAGNOSIS — L258 Unspecified contact dermatitis due to other agents: Secondary | ICD-10-CM | POA: Diagnosis not present

## 2021-11-19 ENCOUNTER — Other Ambulatory Visit: Payer: Self-pay

## 2021-11-19 ENCOUNTER — Emergency Department (HOSPITAL_BASED_OUTPATIENT_CLINIC_OR_DEPARTMENT_OTHER)
Admission: EM | Admit: 2021-11-19 | Discharge: 2021-11-19 | Disposition: A | Discharge status: Home / Self Care | Payer: Federal, State, Local not specified - PPO | Attending: Emergency Medicine | Admitting: Emergency Medicine

## 2021-11-19 ENCOUNTER — Encounter (HOSPITAL_BASED_OUTPATIENT_CLINIC_OR_DEPARTMENT_OTHER): Payer: Self-pay

## 2021-11-19 DIAGNOSIS — J0110 Acute frontal sinusitis, unspecified: Secondary | ICD-10-CM

## 2021-11-19 DIAGNOSIS — R059 Cough, unspecified: Secondary | ICD-10-CM | POA: Diagnosis not present

## 2021-11-19 MED ORDER — AMOXICILLIN-POT CLAVULANATE 875-125 MG PO TABS: 1.0000 | ORAL_TABLET | Freq: Two times a day (BID) | ORAL | 0 refills | Status: DC

## 2021-11-19 MED ORDER — AMOXICILLIN-POT CLAVULANATE 875-125 MG PO TABS
1.0000 | ORAL_TABLET | Freq: Once | ORAL | Status: AC
  Administered 2021-11-19: 1 via ORAL
  Filled 2021-11-19: qty 1

## 2021-11-19 NOTE — ED Triage Notes (Signed)
PT arrives with c/o sinus pressure and headache for 10-12 days. Pt has been using some OTC medication. Reports she is having trouble sleeping because she is so congested.

## 2021-11-19 NOTE — ED Notes (Signed)
Pt discharged to home. Discharge instructions have been discussed with patient and/or family members. Pt verbally acknowledges understanding d/c instructions, and endorses comprehension to checkout at registration before leaving.  °

## 2021-11-19 NOTE — Discharge Instructions (Signed)
Take tylenol 2 pills 4 times a day and motrin 4 pills 3 times a day.  Drink plenty of fluids.  Return for worsening shortness of breath, headache, confusion. Follow up with your family doctor.   

## 2021-11-19 NOTE — ED Provider Notes (Signed)
[facility] EMERGENCY DEPARTMENT Provider Note   CSN: [id number] Arrival date & time: 11/19/21  5929     History  Chief Complaint  Patient presents with   Nasal Congestion    Jill Escobar is a 66 y.o. female.  66 yo F with a chief complaints of sinus pain.  Going on for couple weeks.  Mild cough sometimes when she lays back flat.  No fevers or chills.  The history is provided by the patient.  Illness Severity:  Moderate Onset quality:  Gradual Duration:  2 days Timing:  Constant Progression:  Worsening Chronicity:  New Associated symptoms: congestion and cough   Associated symptoms: no chest pain, no fever, no headaches, no myalgias, no nausea, no rhinorrhea, no shortness of breath, no vomiting and no wheezing       Home Medications Prior to Admission medications   Medication Sig Start Date End Date Taking? Authorizing Provider  amoxicillin-clavulanate (AUGMENTIN) 875-125 MG tablet Take 1 tablet by mouth every 12 (twelve) hours. 11/19/21  Yes Melene Plan, DO  acetaminophen (TYLENOL) 500 MG tablet Take 1,000 mg by mouth at bedtime as needed (pain).    [provider]  diltiazem (CARDIZEM) 30 MG tablet Take 1 tablet every 4 hours AS NEEDED for AFIB heart rate >100 as long as blood pressure >100. Please keep upcoming appt in March 2022 with Dr. Johney Frame before anymore refills. Thank you 12/24/20   Hillis Range, MD  diltiazem (CARTIA XT) 240 MG 24 hr capsule Take 1 capsule (240 mg total) by mouth daily. 01/08/21   Allred, Fayrene Fearing, MD  doxylamine, Sleep, (UNISOM) 25 MG tablet Take 25 mg by mouth at bedtime as needed for sleep.    [provider]  methocarbamol (ROBAXIN) 500 MG tablet Take 1 tablet (500 mg total) by mouth 2 (two) times daily. 10/04/19   Mannie Stabile, PA-C  metoprolol tartrate (LOPRESSOR) 100 MG tablet Take 1 tablet (100 mg total) by mouth Once PRN for up to 1 dose (-If HR is less than 55 -No Lopressor, if HR is greater than 55 -Take  Lopressor). 01/22/21   Allred, Fayrene Fearing, MD  Multiple Vitamin (MULTIVITAMIN WITH MINERALS) TABS tablet Take 1 tablet by mouth daily.    [provider]  telmisartan-hydrochlorothiazide (MICARDIS HCT) 80-25 MG tablet TAKE 1 TABLET BY MOUTH ONCE DAILY. 01/08/21   Hillis Range, MD      Allergies    Shellfish allergy    Review of Systems   Review of Systems  Constitutional:  Negative for chills and fever.  HENT:  Positive for congestion and sinus pressure. Negative for rhinorrhea.   Eyes:  Negative for redness and visual disturbance.  Respiratory:  Positive for cough. Negative for shortness of breath and wheezing.   Cardiovascular:  Negative for chest pain and palpitations.  Gastrointestinal:  Negative for nausea and vomiting.  Genitourinary:  Negative for dysuria and urgency.  Musculoskeletal:  Negative for arthralgias and myalgias.  Skin:  Negative for pallor and wound.  Neurological:  Negative for dizziness and headaches.   Physical Exam Updated Vital Signs BP (!) 177/96 (BP Location: Left Arm)    Pulse 80    Temp 98.1 F (36.7 C) (Oral)    Resp 18    Ht 5\' 9"  (1.753 m)    Wt 89.8 kg    SpO2 100%    BMI 29.24 kg/m  Physical Exam Vitals and nursing note reviewed.  Constitutional:      General: She is not in  acute distress.    Appearance: She is well-developed. She is not diaphoretic.  HENT:     Head: Normocephalic and atraumatic.     Comments: Swollen turbinates, posterior nasal drip, no noted sinus ttp, tm normal bilaterally.   Eyes:     Pupils: Pupils are equal, round, and reactive to light.  Cardiovascular:     Rate and Rhythm: Normal rate and regular rhythm.     Heart sounds: No murmur heard.   No friction rub. No gallop.  Pulmonary:     Effort: Pulmonary effort is normal.     Breath sounds: No wheezing or rales.  Abdominal:     General: There is no distension.     Palpations: Abdomen is soft.     Tenderness: There is no abdominal tenderness.  Musculoskeletal:         General: No tenderness.     Cervical back: Normal range of motion and neck supple.  Skin:    General: Skin is warm and dry.  Neurological:     Mental Status: She is alert and oriented to person, place, and time.  Psychiatric:        Behavior: Behavior normal.    ED Results / Procedures / Treatments   Labs (all labs ordered are listed, but only abnormal results are displayed) Labs Reviewed - No data to display  EKG None  Radiology No results found.  Procedures Procedures    Medications Ordered in ED Medications  amoxicillin-clavulanate (AUGMENTIN) 875-125 MG per tablet 1 tablet (has no administration in time range)    ED Course/ Medical Decision Making/ A&P                           Medical Decision Making Risk Prescription drug management.   66 yo F with a chief complaints of sinus pain and pressure.  Described as severe going on for the past couple weeks.  Per the IDSA guidelines on treating sinusitis with severe sinus discomfort we will treat with antibiotics.  We will have her follow-up with her family doctor in the office.  10:03 AM:  I have discussed the diagnosis/risks/treatment options with the patient and believe the pt to be eligible for discharge home to follow-up with PCP. We also discussed returning to the ED immediately if new or worsening sx occur. We discussed the sx which are most concerning (e.g., sudden worsening pain, fever, inability to tolerate by mouth) that necessitate immediate return. Medications administered to the patient during their visit and any new prescriptions provided to the patient are listed below.  Medications given during this visit Medications  amoxicillin-clavulanate (AUGMENTIN) 875-125 MG per tablet 1 tablet (has no administration in time range)     The patient appears reasonably screen and/or stabilized for discharge and I doubt any other medical condition or other [facility] requiring further screening, evaluation, or treatment  in the ED at this time prior to discharge.          Final Clinical Impression(s) / ED Diagnoses Final diagnoses:  Acute frontal sinusitis, recurrence not specified    Rx / DC Orders ED Discharge Orders          Ordered    amoxicillin-clavulanate (AUGMENTIN) 875-125 MG tablet  Every 12 hours        11/19/21 [place], Athalee Esterline, DO 11/19/21 1003

## 2022-02-19 ENCOUNTER — Telehealth: Payer: Self-pay | Admitting: Internal Medicine

## 2022-02-19 DIAGNOSIS — I480 Paroxysmal atrial fibrillation: Secondary | ICD-10-CM

## 2022-02-19 DIAGNOSIS — I10 Essential (primary) hypertension: Secondary | ICD-10-CM

## 2022-02-19 MED ORDER — TELMISARTAN-HCTZ 80-25 MG PO TABS: ORAL_TABLET | ORAL | 0 refills | Status: DC

## 2022-02-19 MED ORDER — DILTIAZEM HCL 30 MG PO TABS: ORAL_TABLET | ORAL | 0 refills | Status: AC

## 2022-02-19 MED ORDER — DILTIAZEM HCL ER COATED BEADS 240 MG PO CP24: 240.0000 mg | ORAL_CAPSULE | Freq: Every day | ORAL | 0 refills | Status: DC

## 2022-02-19 NOTE — Telephone Encounter (Signed)
Pt scheduled to see Dr. Johney Frame, 03/04/22, #30 day refills for Diltiazem and Telmisartan-HCTZ has been sent to [facility], [employer]. ?

## 2022-02-19 NOTE — Telephone Encounter (Signed)
?*  STAT* If patient is at the pharmacy, call can be transferred to refill team. ? ? ?1. Which medications need to be refilled? (please list name of each medication and dose if known)  ?diltiazem (CARDIZEM) 30 MG tablet ?telmisartan-hydrochlorothiazide (MICARDIS HCT) 80-25 MG tablet ? ?2. Which pharmacy/location (including street and city if local pharmacy) is medication to be sent to? ?[facility] - , [place] - [address] ? ?3. Do they need a 30 day or 90 day supply? 30 with refills ? ?Patient is scheduled to see Dr. Johney Frame 03/11/22  ?

## 2022-02-28 ENCOUNTER — Emergency Department (HOSPITAL_BASED_OUTPATIENT_CLINIC_OR_DEPARTMENT_OTHER): Payer: Federal, State, Local not specified - PPO

## 2022-02-28 ENCOUNTER — Other Ambulatory Visit: Payer: Self-pay

## 2022-02-28 ENCOUNTER — Emergency Department (HOSPITAL_BASED_OUTPATIENT_CLINIC_OR_DEPARTMENT_OTHER)
Admission: EM | Admit: 2022-02-28 | Discharge: 2022-02-28 | Disposition: A | Discharge status: Home / Self Care | Payer: Federal, State, Local not specified - PPO | Attending: Emergency Medicine | Admitting: Emergency Medicine

## 2022-02-28 ENCOUNTER — Encounter (HOSPITAL_BASED_OUTPATIENT_CLINIC_OR_DEPARTMENT_OTHER): Payer: Self-pay | Admitting: Urology

## 2022-02-28 DIAGNOSIS — R001 Bradycardia, unspecified: Secondary | ICD-10-CM | POA: Insufficient documentation

## 2022-02-28 DIAGNOSIS — Z87891 Personal history of nicotine dependence: Secondary | ICD-10-CM | POA: Insufficient documentation

## 2022-02-28 DIAGNOSIS — E871 Hypo-osmolality and hyponatremia: Secondary | ICD-10-CM | POA: Insufficient documentation

## 2022-02-28 DIAGNOSIS — R509 Fever, unspecified: Secondary | ICD-10-CM | POA: Diagnosis not present

## 2022-02-28 DIAGNOSIS — R7401 Elevation of levels of liver transaminase levels: Secondary | ICD-10-CM | POA: Diagnosis not present

## 2022-02-28 DIAGNOSIS — E878 Other disorders of electrolyte and fluid balance, not elsewhere classified: Secondary | ICD-10-CM | POA: Diagnosis not present

## 2022-02-28 DIAGNOSIS — R0602 Shortness of breath: Secondary | ICD-10-CM | POA: Diagnosis not present

## 2022-02-28 DIAGNOSIS — J988 Other specified respiratory disorders: Secondary | ICD-10-CM

## 2022-02-28 DIAGNOSIS — I10 Essential (primary) hypertension: Secondary | ICD-10-CM | POA: Diagnosis not present

## 2022-02-28 DIAGNOSIS — E876 Hypokalemia: Secondary | ICD-10-CM | POA: Diagnosis not present

## 2022-02-28 DIAGNOSIS — R0789 Other chest pain: Secondary | ICD-10-CM | POA: Diagnosis not present

## 2022-02-28 DIAGNOSIS — R079 Chest pain, unspecified: Secondary | ICD-10-CM

## 2022-02-28 DIAGNOSIS — J069 Acute upper respiratory infection, unspecified: Secondary | ICD-10-CM | POA: Diagnosis not present

## 2022-02-28 DIAGNOSIS — Z20822 Contact with and (suspected) exposure to covid-19: Secondary | ICD-10-CM | POA: Insufficient documentation

## 2022-02-28 DIAGNOSIS — R059 Cough, unspecified: Secondary | ICD-10-CM | POA: Diagnosis not present

## 2022-02-28 LAB — CBC WITH DIFFERENTIAL/PLATELET
Abs Immature Granulocytes: 0.02 10*3/uL (ref 0.00–0.07)
Basophils Absolute: 0 10*3/uL (ref 0.0–0.1)
Basophils Relative: 1 %
Eosinophils Absolute: 0.1 10*3/uL (ref 0.0–0.5)
Eosinophils Relative: 2 %
HCT: 38.4 % (ref 36.0–46.0)
Hemoglobin: 13.3 g/dL (ref 12.0–15.0)
Immature Granulocytes: 1 %
Lymphocytes Relative: 55 %
Lymphs Abs: 2.5 10*3/uL (ref 0.7–4.0)
MCH: 33.3 pg (ref 26.0–34.0)
MCHC: 34.6 g/dL (ref 30.0–36.0)
MCV: 96 fL (ref 80.0–100.0)
Monocytes Absolute: 0.4 10*3/uL (ref 0.1–1.0)
Monocytes Relative: 9 %
Neutro Abs: 1.4 10*3/uL — ABNORMAL LOW (ref 1.7–7.7)
Neutrophils Relative %: 32 %
Platelets: 173 10*3/uL (ref 150–400)
RBC: 4 MIL/uL (ref 3.87–5.11)
RDW: 13.3 % (ref 11.5–15.5)
WBC: 4.5 10*3/uL (ref 4.0–10.5)
nRBC: 0 % (ref 0.0–0.2)

## 2022-02-28 LAB — COMPREHENSIVE METABOLIC PANEL
ALT: 19 U/L (ref 0–44)
AST: 43 U/L — ABNORMAL HIGH (ref 15–41)
Albumin: 4.1 g/dL (ref 3.5–5.0)
Alkaline Phosphatase: 92 U/L (ref 38–126)
Anion gap: 10 (ref 5–15)
BUN: 13 mg/dL (ref 8–23)
CO2: 27 mmol/L (ref 22–32)
Calcium: 9.4 mg/dL (ref 8.9–10.3)
Chloride: 97 mmol/L — ABNORMAL LOW (ref 98–111)
Creatinine, Ser: 1.21 mg/dL — ABNORMAL HIGH (ref 0.44–1.00)
GFR, Estimated: 50 mL/min — ABNORMAL LOW (ref >60)
Glucose, Bld: 109 mg/dL — ABNORMAL HIGH (ref 70–99)
Potassium: 3.2 mmol/L — ABNORMAL LOW (ref 3.5–5.1)
Sodium: 134 mmol/L — ABNORMAL LOW (ref 135–145)
Total Bilirubin: 0.9 mg/dL (ref 0.3–1.2)
Total Protein: 7.9 g/dL (ref 6.5–8.1)

## 2022-02-28 LAB — TROPONIN I (HIGH SENSITIVITY)
Troponin I (High Sensitivity): 8 ng/L (ref <18)
Troponin I (High Sensitivity): 8 ng/L (ref <18)

## 2022-02-28 LAB — RESP PANEL BY RT-PCR (FLU A&B, COVID) ARPGX2
Influenza A by PCR: NEGATIVE
Influenza B by PCR: NEGATIVE
SARS Coronavirus 2 by RT PCR: NEGATIVE

## 2022-02-28 MED ORDER — IPRATROPIUM-ALBUTEROL 0.5-2.5 (3) MG/3ML IN SOLN
3.0000 mL | Freq: Once | RESPIRATORY_TRACT | Status: AC
  Administered 2022-02-28: 3 mL via RESPIRATORY_TRACT
  Filled 2022-02-28: qty 3

## 2022-02-28 MED ORDER — AEROCHAMBER PLUS FLO-VU MEDIUM MISC
1.0000 | Freq: Once | Status: AC
  Administered 2022-02-28: 1
  Filled 2022-02-28: qty 1

## 2022-02-28 MED ORDER — ALBUTEROL SULFATE HFA 108 (90 BASE) MCG/ACT IN AERS
1.0000 | INHALATION_SPRAY | Freq: Once | RESPIRATORY_TRACT | Status: AC
  Administered 2022-02-28: 1 via RESPIRATORY_TRACT
  Filled 2022-02-28: qty 6.7

## 2022-02-28 MED ORDER — DOXYCYCLINE HYCLATE 100 MG PO CAPS: 100.0000 mg | ORAL_CAPSULE | Freq: Two times a day (BID) | ORAL | 0 refills | Status: DC

## 2022-02-28 MED ORDER — POTASSIUM CHLORIDE CRYS ER 20 MEQ PO TBCR
40.0000 meq | EXTENDED_RELEASE_TABLET | Freq: Once | ORAL | Status: AC
  Administered 2022-02-28: 40 meq via ORAL
  Filled 2022-02-28: qty 2

## 2022-02-28 NOTE — Discharge Instructions (Addendum)
You were seen in the emergency department today for evaluation of your cough and cold symptoms.  You tested negative for COVID and flu.  Your chest x-ray and labs are reassuring, however you did have low potassium again.  We have replenished some of this orally.  I would like you to follow-up with her primary care doctor to have this lab rechecked in a week to make sure your potassium level has not gone down any lower.  Additionally, I have included information for a primary care provider to this discharge paperwork.  Please call to schedule an appointment to establish care with them. If you have any concern, new or worsening symptom, please return to the nearest ER for re-evaluation.  ? ?Contact a doctor if: ?You are getting worse, not better. ?You have any of these: ?A fever or chills. ?Brown or red mucus in your nose. ?Yellow or brown fluid (discharge)coming from your nose. ?Pain in your face, especially when you bend forward. ?Swollen neck glands. ?Pain when you swallow. ?White areas in the back of your throat. ?Get help right away if: ?You have shortness of breath that gets worse. ?You have very bad or constant: ?Headache. ?Ear pain. ?Pain in your forehead, behind your eyes, and over your cheekbones (sinus pain). ?Chest pain. ?You have long-lasting (chronic) lung disease along with any of these: ?Making high-pitched whistling sounds when you breathe, most often when you breathe out (wheezing). ?Long-lasting cough (more than 14 days). ?Coughing up blood. ?A change in your usual mucus. ?You have a stiff neck. ?You have changes in your: ?Vision. ?Hearing. ?Thinking. ?Mood. ?These symptoms may be an emergency. Get help right away. Call 911. ?Do not wait to see if the symptoms will go away. ?Do not drive yourself to the hospital. ?

## 2022-02-28 NOTE — ED Provider Notes (Signed)
?[facility] EMERGENCY DEPARTMENT ?Provider Note ? ? ?CSN: [id number] ?Arrival date & time: 02/28/22  1540 ? ?  ? ?History ?Chief Complaint  ?Patient presents with  ? Shortness of Breath  ? ? ?Jill Escobar is a 66 y.o. female with history of atrial fibrillation status post cardioversion, hypertension, hypokalemia presents the emergency department for evaluation of her nonproductive cough for the past 6 to 7 days.  She reports she had some rhinorrhea and nasal congestion as well.  She additionally had a fever on Friday that was measured orally at 100.8 Fahrenheit and took Aleve.  She reports she did not have any other fevers after that.  She reports some shortness of breath and wheezing.  Mentions she has been more fatigued.  She has some chest pain with coughing and reports she feels occasional palpitations, although she reports she does have some paroxysmal A-fib episodes after her ablation.  She is not on a blood thinner.  Denies any nausea, vomiting, diarrhea, constipation.  She reports she does have some sore throat and some body aches as well.  She has not tried any cough or cold medications. ? ? ?Shortness of Breath ?Associated symptoms: chest pain, cough, fever and wheezing   ?Associated symptoms: no abdominal pain, no headaches and no vomiting   ? ?  ? ?Home Medications ?Prior to Admission medications   ?Medication Sig Start Date End Date Taking? Authorizing Provider  ?acetaminophen (TYLENOL) 500 MG tablet Take 1,000 mg by mouth at bedtime as needed (pain).    [provider]  ?amoxicillin-clavulanate (AUGMENTIN) 875-125 MG tablet Take 1 tablet by mouth every 12 (twelve) hours. 11/19/21   Melene Plan, DO  ?diltiazem (CARDIZEM) 30 MG tablet Take 1 tablet every 4 hours AS NEEDED for AFIB heart rate >100 as long as blood pressure >100. 02/19/22   Allred, Fayrene Fearing, MD  ?diltiazem (CARTIA XT) 240 MG 24 hr capsule Take 1 capsule (240 mg total) by mouth daily. 02/19/22   Allred, Fayrene Fearing, MD  ?doxylamine,  Sleep, (UNISOM) 25 MG tablet Take 25 mg by mouth at bedtime as needed for sleep.    [provider]  ?methocarbamol (ROBAXIN) 500 MG tablet Take 1 tablet (500 mg total) by mouth 2 (two) times daily. 10/04/19   Mannie Stabile, PA-C  ?metoprolol tartrate (LOPRESSOR) 100 MG tablet Take 1 tablet (100 mg total) by mouth Once PRN for up to 1 dose (-If HR is less than 55 -No Lopressor, if HR is greater than 55 -Take Lopressor). 01/22/21   Allred, Fayrene Fearing, MD  ?Multiple Vitamin (MULTIVITAMIN WITH MINERALS) TABS tablet Take 1 tablet by mouth daily.    [provider]  ?telmisartan-hydrochlorothiazide (MICARDIS HCT) 80-25 MG tablet TAKE 1 TABLET BY MOUTH ONCE DAILY. 02/19/22   Hillis Range, MD  ?   ? ?Allergies    ?Shellfish allergy   ? ?Review of Systems   ?Review of Systems  ?Constitutional:  Positive for fatigue and fever. Negative for chills.  ?HENT:  Positive for congestion and rhinorrhea.   ?Respiratory:  Positive for cough, shortness of breath and wheezing.   ?Cardiovascular:  Positive for chest pain and palpitations. Negative for leg swelling.  ?Gastrointestinal:  Negative for abdominal pain, constipation, diarrhea, nausea and vomiting.  ?Musculoskeletal:  Positive for myalgias. Negative for joint swelling.  ?Neurological:  Negative for light-headedness and headaches.  ? ?Physical Exam ?Updated Vital Signs ?BP (!) 157/74   Pulse 60   Temp 98.4 ?F (36.9 ?C) (Oral)   Resp 16  Ht 5\' 9"  (1.753 m)   Wt 89.9 kg   SpO2 100%   BMI 29.27 kg/m?  ?Physical Exam ?Vitals and nursing note reviewed.  ?Constitutional:   ?   General: She is not in acute distress. ?   Appearance: Normal appearance. She is not ill-appearing or toxic-appearing.  ?HENT:  ?   Head: Normocephalic and atraumatic.  ?   Mouth/Throat:  ?   Mouth: Mucous membranes are moist.  ?   Pharynx: Oropharynx is clear.  ?   Comments: No pharyngeal erythema, edema, or exudate noted.  Moist mucous membranes.  Uvula midline.  Airway patent. ?Eyes:   ?   General: No scleral icterus. ?Cardiovascular:  ?   Rate and Rhythm: Regular rhythm. Bradycardia present.  ?   Heart sounds: No murmur heard. ?Pulmonary:  ?   Effort: Pulmonary effort is normal. No respiratory distress.  ?   Breath sounds: Examination of the right-lower field reveals wheezing. Examination of the left-lower field reveals wheezing and rhonchi. Wheezing and rhonchi present. No decreased breath sounds.  ?   Comments: Patient has end expiratory wheezing in bilateral lower lobes.  Possible rhonchi auscultated in the left lower lobe.  She is speaking in full sentences with ease.  No respiratory distress, accessory muscle use, nasal flaring, tripoding, or cyanosis present.  She is satting well on room air without any increased work of breathing. ?Chest:  ?   Chest wall: No tenderness.  ?Abdominal:  ?   General: Abdomen is flat. Bowel sounds are normal.  ?   Palpations: Abdomen is soft.  ?   Tenderness: There is no abdominal tenderness. There is no guarding or rebound.  ?   Comments: Abdomen soft, nontender.  Normal active bowel sounds.  ?Musculoskeletal:     ?   General: No deformity.  ?   Cervical back: Normal range of motion.  ?   Right lower leg: No tenderness. No edema.  ?   Left lower leg: No edema.  ?Lymphadenopathy:  ?   Cervical: No cervical adenopathy.  ?Skin: ?   General: Skin is warm and dry.  ?Neurological:  ?   General: No focal deficit present.  ?   Mental Status: She is alert. Mental status is at baseline.  ? ? ?ED Results / Procedures / Treatments   ?Labs ?(all labs ordered are listed, but only abnormal results are displayed) ?Labs Reviewed  ?COMPREHENSIVE METABOLIC PANEL - Abnormal; Notable for the following components:  ?    Result Value  ? Sodium 134 (*)   ? Potassium 3.2 (*)   ? Chloride 97 (*)   ? Glucose, Bld 109 (*)   ? Creatinine, Ser 1.21 (*)   ? AST 43 (*)   ? GFR, Estimated 50 (*)   ? All other components within normal limits  ?CBC WITH DIFFERENTIAL/PLATELET - Abnormal;  Notable for the following components:  ? Neutro Abs 1.4 (*)   ? All other components within normal limits  ?RESP PANEL BY RT-PCR (FLU A&B, COVID) [id number]  ?TROPONIN I (HIGH SENSITIVITY)  ?TROPONIN I (HIGH SENSITIVITY)  ? ? ?EKG ?None ? ?Radiology ?DG Chest 2 View ? ?Result Date: 02/28/2022 ?CLINICAL DATA:  Dry cough and chest discomfort for 1 week, fever for 2 days, hypertension, former smoker EXAM: CHEST - 2 VIEW COMPARISON:  10/04/2019 FINDINGS: Loop recorder projects over LEFT chest. Upper normal heart size. Mediastinal contours and pulmonary vascularity normal. Chronic peribronchial thickening. No pulmonary infiltrate, pleural effusion, or pneumothorax. RIGHT apex  scarring stable. No acute osseous findings. IMPRESSION: No acute abnormalities. Electronically Signed   By: Ulyses SouthwardMark  Boles M.D.   On: 02/28/2022 16:25   ? ?Procedures ?Procedures  ? ?Medications Ordered in ED ?Medications  ?ipratropium-albuterol (DUONEB) 0.5-2.5 (3) MG/3ML nebulizer solution 3 mL (3 mLs Nebulization Given 02/28/22 1705)  ? ? ?ED Course/ Medical Decision Making/ A&P ?  ?                        ?Medical Decision Making ?Amount and/or Complexity of Data Reviewed ?Labs: ordered. ?Radiology: ordered. ? ?Risk ?Prescription drug management. ? ? ?66 year old female presents emergency department for evaluation of dry cough, rhinorrhea, shortness of breath, wheezing, fatigue for the past 6 to 7 days.  Differential diagnosis includes is not limited to pneumonia, asthma, allergies, viral illness, COVID, flu, ACS, arrhythmia, PE.  Vital signs show mild hypertension at 157/74.  Patient is afebrile, normal pulse rate, satting well on room air without any increased work of breathing.  Physical exam is pertinent for patient has end expiratory wheezing in bilateral lower lobes.  Possible rhonchi auscultated in the left lower lobe.  She is speaking in full sentences with ease.  No respiratory distress, accessory muscle use, nasal flaring, tripoding, or  cyanosis present.  She is satting well on room air without any increased work of breathing.  She is not toxic appearing.  Not ill-appearing. No pharyngeal erythema, edema, or exudate noted.  Moist mucous membranes.  [place]

## 2022-02-28 NOTE — ED Notes (Signed)
Pt A&OX4 ambulatory at d/c with independent steady gait. Pt verbalized understanding of d/c instructions, prescription and follow up care. 

## 2022-02-28 NOTE — ED Triage Notes (Signed)
Dry Cough x 1 week, headache  ?Fever Friday of 101 ?States wheezing and shortness of breath since Saturday  ? ?

## 2022-03-11 ENCOUNTER — Encounter (HOSPITAL_BASED_OUTPATIENT_CLINIC_OR_DEPARTMENT_OTHER): Payer: Self-pay | Admitting: Internal Medicine

## 2022-03-11 ENCOUNTER — Ambulatory Visit (HOSPITAL_BASED_OUTPATIENT_CLINIC_OR_DEPARTMENT_OTHER): Payer: Federal, State, Local not specified - PPO | Admitting: Internal Medicine

## 2022-03-11 VITALS — BP 142/84 | HR 62 | Ht 68.0 in | Wt 208.0 lb

## 2022-03-11 DIAGNOSIS — I10 Essential (primary) hypertension: Secondary | ICD-10-CM | POA: Diagnosis not present

## 2022-03-11 DIAGNOSIS — I480 Paroxysmal atrial fibrillation: Secondary | ICD-10-CM

## 2022-03-11 MED ORDER — TELMISARTAN-HCTZ 80-25 MG PO TABS: ORAL_TABLET | ORAL | 3 refills | Status: DC

## 2022-03-11 MED ORDER — DILTIAZEM HCL ER COATED BEADS 240 MG PO CP24: 240.0000 mg | ORAL_CAPSULE | Freq: Every day | ORAL | 3 refills | Status: DC

## 2022-03-11 NOTE — Patient Instructions (Addendum)
Medication Instructions:  ?Your physician recommends that you continue on your current medications as directed. Please refer to the Current Medication list given to you today. ?*If you need a refill on your cardiac medications before your next appointment, please call your pharmacy* ? ?Lab Work: ?None ordered. ?If you have labs (blood work) drawn today and your tests are completely normal, you will receive your results only by: ?MyChart Message (if you have MyChart) OR ?A paper copy in the mail ?If you have any lab test that is abnormal or we need to change your treatment, we will call you to review the results. ? ?Testing/Procedures: ?None ordered. ? ?Follow-Up: ?At [facility], you and your health needs are our priority.  As part of our continuing mission to provide you with exceptional heart care, we have created designated Provider Care Teams.  These Care Teams include your primary Cardiologist (physician) and Advanced Practice Providers (APPs -  Physician Assistants and Nurse Practitioners) who all work together to provide you with the care you need, when you need it. ? ?Your next appointment:   ?Your physician wants you to follow-up in: 6 months with Dr. Johney Frame. ?You will receive a reminder letter in the mail two months in advance. If you don't receive a letter, please call our office to schedule the follow-up appointment. ? ? ?Important Information About Sugar ? ? ? ? ? ? ? ?

## 2022-03-11 NOTE — Progress Notes (Signed)
? ? ?PCP: Pcp, No ?  ?Primary EP: Dr Rayann Heman ? ?Jill Escobar is a 66 y.o. female who presents today for routine electrophysiology followup.  Since last being seen in our clinic, the patient reports doing very well.  Today, she denies symptoms of palpitations, chest pain, shortness of breath,  lower extremity edema, dizziness, presyncope, or syncope.  The patient is otherwise without complaint today.  ? ?Past Medical History:  ?Diagnosis Date  ? Atrial fibrillation ([place])   ? HTN (hypertension)   ? Hypokalemia   ? Osteoarthritis   ? L hip  ? Overweight   ? ?Past Surgical History:  ?Procedure Laterality Date  ? CARDIAC ELECTROPHYSIOLOGY STUDY AND ABLATION    ? D&C and removal of endonetrial polyp    ? drainage of cyst    ? follicular functional-type of cysyt- L ovary   ? ELECTROPHYSIOLOGIC STUDY N/A 06/17/2015  ? Procedure: Atrial Fibrillation Ablation;  Surgeon: Thompson Grayer, MD;  Location: [place] [facility];  Service: Cardiovascular;  Laterality: N/A;  ? JOINT REPLACEMENT Left 2012  ? hip  ? LAPAROSCOPY    ? with lysis of pelvic adhesions   ? LOOP RECORDER INSERTION N/A 09/27/2017  ? Procedure: LOOP RECORDER INSERTION;  Surgeon: Thompson Grayer, MD;  Location: [place] [facility];  Service: Cardiovascular;  Laterality: N/A;  ? MASS EXCISION Right 05/02/2018  ? Procedure: EXCISION OF SKIN LESION ON RIGHT UPPER ARM;  Surgeon: Armandina Gemma, MD;  Location: [place];  Service: General;  Laterality: Right;  ? NASAL ENDOSCOPY    ? upper  ? TEE WITHOUT CARDIOVERSION N/A 04/14/2015  ? Procedure: TRANSESOPHAGEAL ECHOCARDIOGRAM (TEE);  Surgeon: Jerline Pain, MD;  Location: [place];  Service: Cardiovascular;  Laterality: N/A;  ? TEE WITHOUT CARDIOVERSION N/A 06/17/2015  ? Procedure: TRANSESOPHAGEAL ECHOCARDIOGRAM (TEE);  Surgeon: Skeet Latch, MD;  Location: [facility];  Service: Cardiovascular;  Laterality: N/A;  ? uterosacral nerve ablation    ? with bipolar coagulation. Surgeon Dr. Nori Riis  ?  VESICOVAGINAL FISTULA CLOSURE W/ TAH    ? ? ?ROS- all systems are reviewed and negatives except as per HPI above ? ?Current Outpatient Medications  ?Medication Sig Dispense Refill  ? acetaminophen (TYLENOL) 500 MG tablet Take 1,000 mg by mouth at bedtime as needed (pain).    ? diltiazem (CARDIZEM) 30 MG tablet Take 1 tablet every 4 hours AS NEEDED for AFIB heart rate >100 as long as blood pressure >100. 30 tablet 0  ? diltiazem (CARTIA XT) 240 MG 24 hr capsule Take 1 capsule (240 mg total) by mouth daily. 30 capsule 0  ? Multiple Vitamin (MULTIVITAMIN WITH MINERALS) TABS tablet Take 1 tablet by mouth daily.    ? telmisartan-hydrochlorothiazide (MICARDIS HCT) 80-25 MG tablet TAKE 1 TABLET BY MOUTH ONCE DAILY. 30 tablet 0  ? ?No current facility-administered medications for this visit.  ? ? ?Physical Exam: ?Vitals:  ? 03/11/22 1453  ?BP: (!) 142/84  ?Pulse: 62  ?Weight: 208 lb (94.3 kg)  ?Height: 5\' 8"  (1.727 m)  ? ? ?GEN- The patient is well appearing, alert and oriented x 3 today.   ?Head- normocephalic, atraumatic ?Eyes-  Sclera clear, conjunctiva pink ?Ears- hearing intact ?Oropharynx- clear ?Lungs- Clear to ausculation bilaterally, normal work of breathing ?Heart- Regular rate and rhythm, no murmurs, rubs or gallops, PMI not laterally displaced ?GI- soft, NT, ND, + BS ?Extremities- no clubbing, cyanosis, or edema ? ?Wt Readings from Last 3 Encounters:  ?03/11/22 208 lb (94.3 kg)  ?  02/28/22 198 lb 3.1 oz (89.9 kg)  ?11/19/21 198 lb (89.8 kg)  ? ?Cardiac CT 3/22 reviewed ? ?EKG tracing ordered today is personally reviewed and shows sinus ? ?Assessment and Plan: ? ?Paroxysmal atrial fibrillation ?AF burden has been quite low by ILR previously. ?Device is at RRT. ?She is not interested in AAD or ablation.  She does not currently wish to have her ILR removed.  She will contact our office if she changes her mind. ?Chads2vasc score is 2.  Will be 3 in December.  For now, she wishes to avoid [place]. ? ?2. HTN ?Stable ?No  change required today ? ?3. Overweight ?Body mass index is 31.63 kg/m?. ?Lifestyle modification advised ? ?Risks, benefits and potential toxicities for medications prescribed and/or refilled reviewed with patient today.  ? ?Thompson Grayer MD, FACC ?03/11/2022 ?3:27 PM ? ? ? ? ?

## 2022-03-12 NOTE — Addendum Note (Signed)
Addended by: Barrie Dunker on: 03/12/2022 09:09 AM ? ? Modules accepted: Orders ? ?

## 2022-07-03 ENCOUNTER — Emergency Department (HOSPITAL_BASED_OUTPATIENT_CLINIC_OR_DEPARTMENT_OTHER)
Admission: EM | Admit: 2022-07-03 | Discharge: 2022-07-03 | Disposition: A | Discharge status: Home / Self Care | Attending: Emergency Medicine | Admitting: Emergency Medicine

## 2022-07-03 ENCOUNTER — Encounter (HOSPITAL_BASED_OUTPATIENT_CLINIC_OR_DEPARTMENT_OTHER): Payer: Self-pay | Admitting: Emergency Medicine

## 2022-07-03 ENCOUNTER — Emergency Department (HOSPITAL_BASED_OUTPATIENT_CLINIC_OR_DEPARTMENT_OTHER)

## 2022-07-03 ENCOUNTER — Other Ambulatory Visit: Payer: Self-pay

## 2022-07-03 DIAGNOSIS — I10 Essential (primary) hypertension: Secondary | ICD-10-CM | POA: Insufficient documentation

## 2022-07-03 DIAGNOSIS — S62635A Displaced fracture of distal phalanx of left ring finger, initial encounter for closed fracture: Secondary | ICD-10-CM | POA: Insufficient documentation

## 2022-07-03 DIAGNOSIS — M79645 Pain in left finger(s): Secondary | ICD-10-CM | POA: Insufficient documentation

## 2022-07-03 DIAGNOSIS — Y990 Civilian activity done for income or pay: Secondary | ICD-10-CM | POA: Diagnosis not present

## 2022-07-03 DIAGNOSIS — Z79899 Other long term (current) drug therapy: Secondary | ICD-10-CM | POA: Diagnosis not present

## 2022-07-03 DIAGNOSIS — W230XXA Caught, crushed, jammed, or pinched between moving objects, initial encounter: Secondary | ICD-10-CM | POA: Diagnosis not present

## 2022-07-03 DIAGNOSIS — S6992XA Unspecified injury of left wrist, hand and finger(s), initial encounter: Secondary | ICD-10-CM | POA: Diagnosis present

## 2022-07-03 MED ORDER — ACETAMINOPHEN ER 650 MG PO TBCR
650.0000 mg | EXTENDED_RELEASE_TABLET | Freq: Three times a day (TID) | ORAL | 0 refills | Status: DC | PRN
Start: 2022-07-03 — End: 2024-01-20

## 2022-07-03 MED ORDER — HYDROCODONE-ACETAMINOPHEN 5-325 MG PO TABS
1.0000 | ORAL_TABLET | Freq: Once | ORAL | Status: AC
  Administered 2022-07-03: 1 via ORAL
  Filled 2022-07-03: qty 1

## 2022-07-03 MED ORDER — NAPROXEN 500 MG PO TABS: 500.0000 mg | ORAL_TABLET | Freq: Two times a day (BID) | ORAL | 0 refills | Status: AC

## 2022-07-03 NOTE — ED Notes (Signed)
Patient transported to X-ray 

## 2022-07-03 NOTE — Discharge Instructions (Addendum)
You were provided the contact information for Dr. Dion Saucier, a orthopedic hand specialist.  Please follow-up in their office in the next 1-2 days for reevaluation and continued medical management.  You have been prescribed extra strength 650mg  tylenol.  Please take one tablet every 6 hours as needed for pain relief.  Continue to rest, ice, and elevate your injury.  The finger splint is to be utilized until orthopedic follow-up.  Please return to the ED for any new or worsening symptoms as discussed.   If you do not have a primary doctor see the list below.  RESOURCE GUIDE  Chronic Pain Problems: Contact [facility]  [phone] Patients need to be referred by their primary care doctor.  Insufficient Money for Medicine: Contact United Way:  call "211" or [facility] 956-520-3283.  No Primary Care Doctor: Call [facility] Connect  [phone] - can help you locate a primary care doctor that  accepts your insurance, provides certain services, etc. Physician Referral Service- [phone] Agencies that provide inexpensive medical care: [phone] Family Medicine  [facility]  [phone] [facility]  [phone] [facility]  [phone] Planned Parenthood  [phone] [facility]  [phone]  Medicaid-accepting [facility] Providers: [facility]- [address], [address]  [phone], Mon-Fri 9am-7pm, Sat 9am-1pm [facility]- [address] [place], Suite [place]  [facility]- [address], [address]  [place] [facility]- [address]  [phone]- [address] [place], [address], [place]  Only accepts [phone] Access [place] patients after they have their name  applied to their card  Self Pay (no insurance) in [facility]: Sickle Cell Patients: Dr [facility], [facility] Internal Medicine  [address] [place],  [place] [facility]- [address] [place]  [place]       [phone] [facility] [place]- [address]       -     [facility]- see information above (Speak to 71 if you do not have insurance)       -  [facility]- [address] [place], [place]       -  [facility]- [address],  [place]       -  [facility]- [address], [place]       -  Dr [phone]-  [address], [place], [place]       -  [facility]- [address], [address]       -  [facility]- [address], [address], also [address], [address]       -    [facility]- [address] [place], [place] ch, 1st & 3rd Saturday   every month, 10am-1pm  1) Find a Doctor and Pay Out of Pocket Although you won't have to find out who is covered by your insurance plan, it is a good idea to ask around and get recommendations. You will then need to call the office and see if the doctor you have chosen will accept you as a new patient and what types of options they offer for patients who are self-pay. Some doctors offer discounts or will set up payment plans for their patients who do not have insurance, but you  will need to ask so you aren't surprised when you get to your appointment.  2) Contact Your Local Health Department Not all health departments have doctors that can see patients for sick visits, but many do, so it is worth a call to see if yours does. If you don't know where your local health department is, you can check in your phone book. The CDC also has a tool to help you locate your state's health department, and many state websites also have listings of all of their local health departments.  3) Find a Walk-in Clinic If your illness is not likely to be very severe or complicated, you may want to try a walk in clinic. These are popping up all over the country in pharmacies,  drugstores, and shopping centers. They're usually staffed by nurse practitioners or physician assistants that have been trained to treat common illnesses and complaints. They're usually fairly quick and inexpensive. However, if you have serious medical issues or chronic medical problems, these are probably not your best option  STD Testing [facility] Department of [facility] [place], STD Clinic, [address] [place], phone [phone] or [phone].  Monday - Friday, call for an appointment. [facility] Department of [employer], STD Clinic, [address], [place], phone [phone] or [phone].  Monday - Friday, call for an appointment.  Abuse/Neglect: [facility] Child Abuse Hotline [phone]) 422-6617 [facility] Child Abuse Hotline (435)736-2864 (After Hours)  Emergency Shelter:  [facility] 936-665-5260  Maternity Homes: [facility] [place] [facility] 579-752-0813 [facility] [phone]  MRSA Hotline #:   431-409-3389  [facility] Resources  [facility] [place]     United Way                          [facility] Dept. [address] [place]                       [address]      [address] [place] [address]  Blondell Reveal Phone:  [phone]                                   Phone:  [phone]                 Phone:  [phone]  [facility], [phone] [facility] [place]- [phone]       -     [facility] in [place], [address],                                  [phone], Insurance  [place] Child Abuse Hotline (954)330-4384 or 209-741-8606 (After Hours)  Behavioral Health Services  Substance Abuse Resources: Alcohol and Drug  Services  336-283-4268 [facility] 862-555-7879 [facility] [place] [phone] Floydene Flock (313)169-5180 Residential & Outpatient Substance Abuse Program  708-397-4216  Psychological Services: [facility]  [phone] [facility]  [phone] [facility], [phone] [place]. [address] [place], ACCESS LINE: (859)024-3986 or [phone], [employer]za  Dental Assistance  Patients with Medicaid: [facility] [phone] [address]                                           [phone] [address] [employer] Phone:  [phone]                                                  Phone:  [phone]  If unable to pay or uninsured, contact:  [facility] or [facility]. to become qualified for the adult dental clinic.  Patients with Medicaid: [facility] [phone] [address], [phone] [address] [phone]  If unable to pay, or uninsured, contact [facility] 678-370-6990) or [facility] Department [phone] in [place], [phone] in [facility]) to become qualified for the adult dental clinic  Other Low-Cost Community Dental Services: [facility]- [address] [place], [place], [place], [address], [phone], [phone], 2nd and 4th Thursday of the month at 6:30am.  10 clients each day by appointment, can sometimes see walk-in patients if someone does not show for an appointment. [facility]- [address] [place], [place], [address], [phone] [facility] [address] [place], [place], [address], [phone] [facility] Health Department- [phone] [facility] Health Department- [phone] [facility] Department401 [phone]

## 2022-07-03 NOTE — ED Notes (Signed)
Left ringer finger swollen.Patient states that she can not bend her finger Alert x4 states that she can bend all other finger

## 2022-07-03 NOTE — ED Provider Notes (Signed)
[facility] EMERGENCY DEPARTMENT Provider Note   CSN: [id number] Arrival date & time: 07/03/22  0941     History  Chief Complaint  Patient presents with   Finger Injury    Jill Escobar is a 66 y.o. female with history of HTN and A-fib presenting today with left ring finger injury sustained today when she was at work.  Usually wears gloves, states the glove got caught in a machine and crunched her finger.  Pain worst of the tip of the finger with some numbness as well, however pain does include the entire finger overall.  Immediately removed her ring following the injury prior to swelling.  No other injuries or complaints at this time.  The history is provided by the patient and medical records.      Home Medications Prior to Admission medications   Medication Sig Start Date End Date Taking? Authorizing Provider  naproxen (NAPROSYN) 500 MG tablet Take 1 tablet (500 mg total) by mouth 2 (two) times daily. 07/03/22  Yes Cecil Cobbs, PA-C  acetaminophen (TYLENOL) 500 MG tablet Take 1,000 mg by mouth at bedtime as needed (pain).    [provider]  diltiazem (CARDIZEM) 30 MG tablet Take 1 tablet every 4 hours AS NEEDED for AFIB heart rate >100 as long as blood pressure >100. 02/19/22   Allred, Fayrene Fearing, MD  diltiazem (CARTIA XT) 240 MG 24 hr capsule Take 1 capsule (240 mg total) by mouth daily. 03/11/22   Allred, Fayrene Fearing, MD  Multiple Vitamin (MULTIVITAMIN WITH MINERALS) TABS tablet Take 1 tablet by mouth daily.    [provider]  telmisartan-hydrochlorothiazide (MICARDIS HCT) 80-25 MG tablet TAKE 1 TABLET BY MOUTH ONCE DAILY. 03/11/22   Allred, Fayrene Fearing, MD      Allergies    Penicillins and Shellfish allergy    Review of Systems   Review of Systems  Musculoskeletal:        Left ring finger injury     Physical Exam Updated Vital Signs BP (!) 179/95   Pulse 85   Temp 97.9 F (36.6 C)   Resp 18   Ht 5\' 8"  (1.727 m)   Wt 95 kg   SpO2 99%   BMI  31.84 kg/m  Physical Exam Vitals and nursing note reviewed.  Constitutional:      General: She is not in acute distress.    Appearance: She is well-developed. She is not ill-appearing or toxic-appearing.  HENT:     Head: Normocephalic and atraumatic.  Eyes:     Conjunctiva/sclera: Conjunctivae normal.  Cardiovascular:     Rate and Rhythm: Normal rate and regular rhythm.     Heart sounds: No murmur heard. Pulmonary:     Effort: Pulmonary effort is normal. No respiratory distress.  Abdominal:     Palpations: Abdomen is soft.     Tenderness: There is no abdominal tenderness.  Musculoskeletal:        General: Swelling, tenderness and signs of injury present.     Cervical back: Neck supple.  Skin:    General: Skin is warm and dry.     Capillary Refill: Capillary refill takes less than 2 seconds.  Neurological:     Mental Status: She is alert and oriented to person, place, and time.  Psychiatric:        Mood and Affect: Mood normal.          ED Results / Procedures / Treatments   Labs (all labs ordered are listed, but only  abnormal results are displayed) Labs Reviewed - No data to display  EKG None  Radiology DG Hand Complete Left  Result Date: 07/03/2022 CLINICAL DATA:  Left hand injury. EXAM: LEFT HAND - COMPLETE 3+ VIEW COMPARISON:  None Available. FINDINGS: Moderately displaced and comminuted fracture is seen involving the distal portion of the fourth distal phalanx. No other bony abnormality is noted. Joint spaces are intact. IMPRESSION: Moderately displaced and comminuted fourth distal phalangeal fracture. Electronically Signed   By: Lupita Raider M.D.   On: 07/03/2022 10:12    Procedures Procedures    Medications Ordered in ED Medications  HYDROcodone-acetaminophen (NORCO/VICODIN) 5-325 MG per tablet 1 tablet (1 tablet Oral Given 07/03/22 1028)    ED Course/ Medical Decision Making/ A&P                           Medical Decision Making Amount and/or  Complexity of Data Reviewed Radiology: ordered.  Risk OTC drugs. Prescription drug management.   66 y.o. female presents to the ED for concern of Finger Injury   This involves an extensive number of treatment options, and is a complaint that carries with it a high risk of complications and morbidity.  The emergent differential diagnosis prior to evaluation includes, but is not limited to: contusion, fracture, dislocation  This is not an exhaustive differential.   Past Medical History / Co-morbidities / Social History: Hx of HTN, Afib. Social Determinants of Health include: no PCP, resources provided.  Additional History:  Obtained by chart review.  Notably cardiology, evaluating paroxysmal Afib, CHADs2VASc score of 2, wishes to avoid oral anticoag at this time  Lab Tests: None  Imaging Studies: I ordered imaging studies including XR left hand .   I independently visualized and interpreted imaging which showed a moderately displaced and comminuted fourth distal phalangeal fracture I agree with the radiologist interpretation.  ED Course: Pt well-appearing on exam.  Presenting today with new injury to left ring finger.  Pt X-Ray today depicts moderately displaced and comminuted fourth distal phalangeal fracture of left hand. Pain managed in ED.  Mild decreased sensation of posterior distal phalange, with worsened sensation and purple discoloration of anterior distal phalange.  Also mild decreased sensation of anterior middle and proximal phalanges.  Pictures above.  Due to these exam findings, consultation placed for hand surgery. Consulted with Dr. Dion Saucier.  Discussed patient case at length.  Recommended splinting the finger and urgently following up in the next 2 to 3 days for reevaluation continue medical management. Discussed orthopedic recommendations with patient.  Patient given finger splint while in ED.  After administration, extremity rechecked and remains unchanged intact.   Recommended RICE therapy and conservative symptom management until orthopedic follow up.  Patient in NAD and in good condition at time of discharge.  Disposition: After consideration the patient's encounter today, I do not feel today's workup suggests an emergent condition requiring admission or immediate intervention beyond what has been performed at this time.  Safe for discharge; instructed to return immediately for worsening symptoms, change in symptoms or any other concerns.  I have reviewed the patients home medicines and have made adjustments as needed.  Discussed course of treatment with the patient, whom demonstrated understanding.  Patient in agreement and has no further questions.    I discussed this case with my attending physician Dr. Jacqulyn Bath, who agreed with the proposed treatment course and cosigned this note including patient's presenting symptoms, physical exam, and planned  diagnostics and interventions.  Attending physician stated agreement with plan or made changes to plan which were implemented.     This chart was dictated using voice recognition software.  Despite best efforts to proofread, errors can occur which can change the documentation meaning.         Final Clinical Impression(s) / ED Diagnoses Final diagnoses:  Closed displaced fracture of distal phalanx of left ring finger, initial encounter    Rx / DC Orders ED Discharge Orders          Ordered    naproxen (NAPROSYN) 500 MG tablet  2 times daily        07/03/22 1032              Candace Cruise [id number]    Margette Fast, MD 07/08/22 [id number]

## 2022-07-03 NOTE — ED Triage Notes (Signed)
Pt arrives pov, steady gait, reports left ring finger injury today at work. Swelling and bruising noted

## 2022-07-14 DIAGNOSIS — S62645A Nondisplaced fracture of proximal phalanx of left ring finger, initial encounter for closed fracture: Secondary | ICD-10-CM | POA: Diagnosis not present

## 2022-07-28 DIAGNOSIS — S62645D Nondisplaced fracture of proximal phalanx of left ring finger, subsequent encounter for fracture with routine healing: Secondary | ICD-10-CM | POA: Diagnosis not present

## 2022-12-29 IMAGING — CR DG CHEST 2V
2 series · 2 of 2 positions shown · non-contrast
Comparison: 10/04/2019

CLINICAL DATA: Dry cough and chest discomfort for 1 week, fever for
2 days, hypertension, former smoker

EXAM:
CHEST - 2 VIEW

[w chest pa]
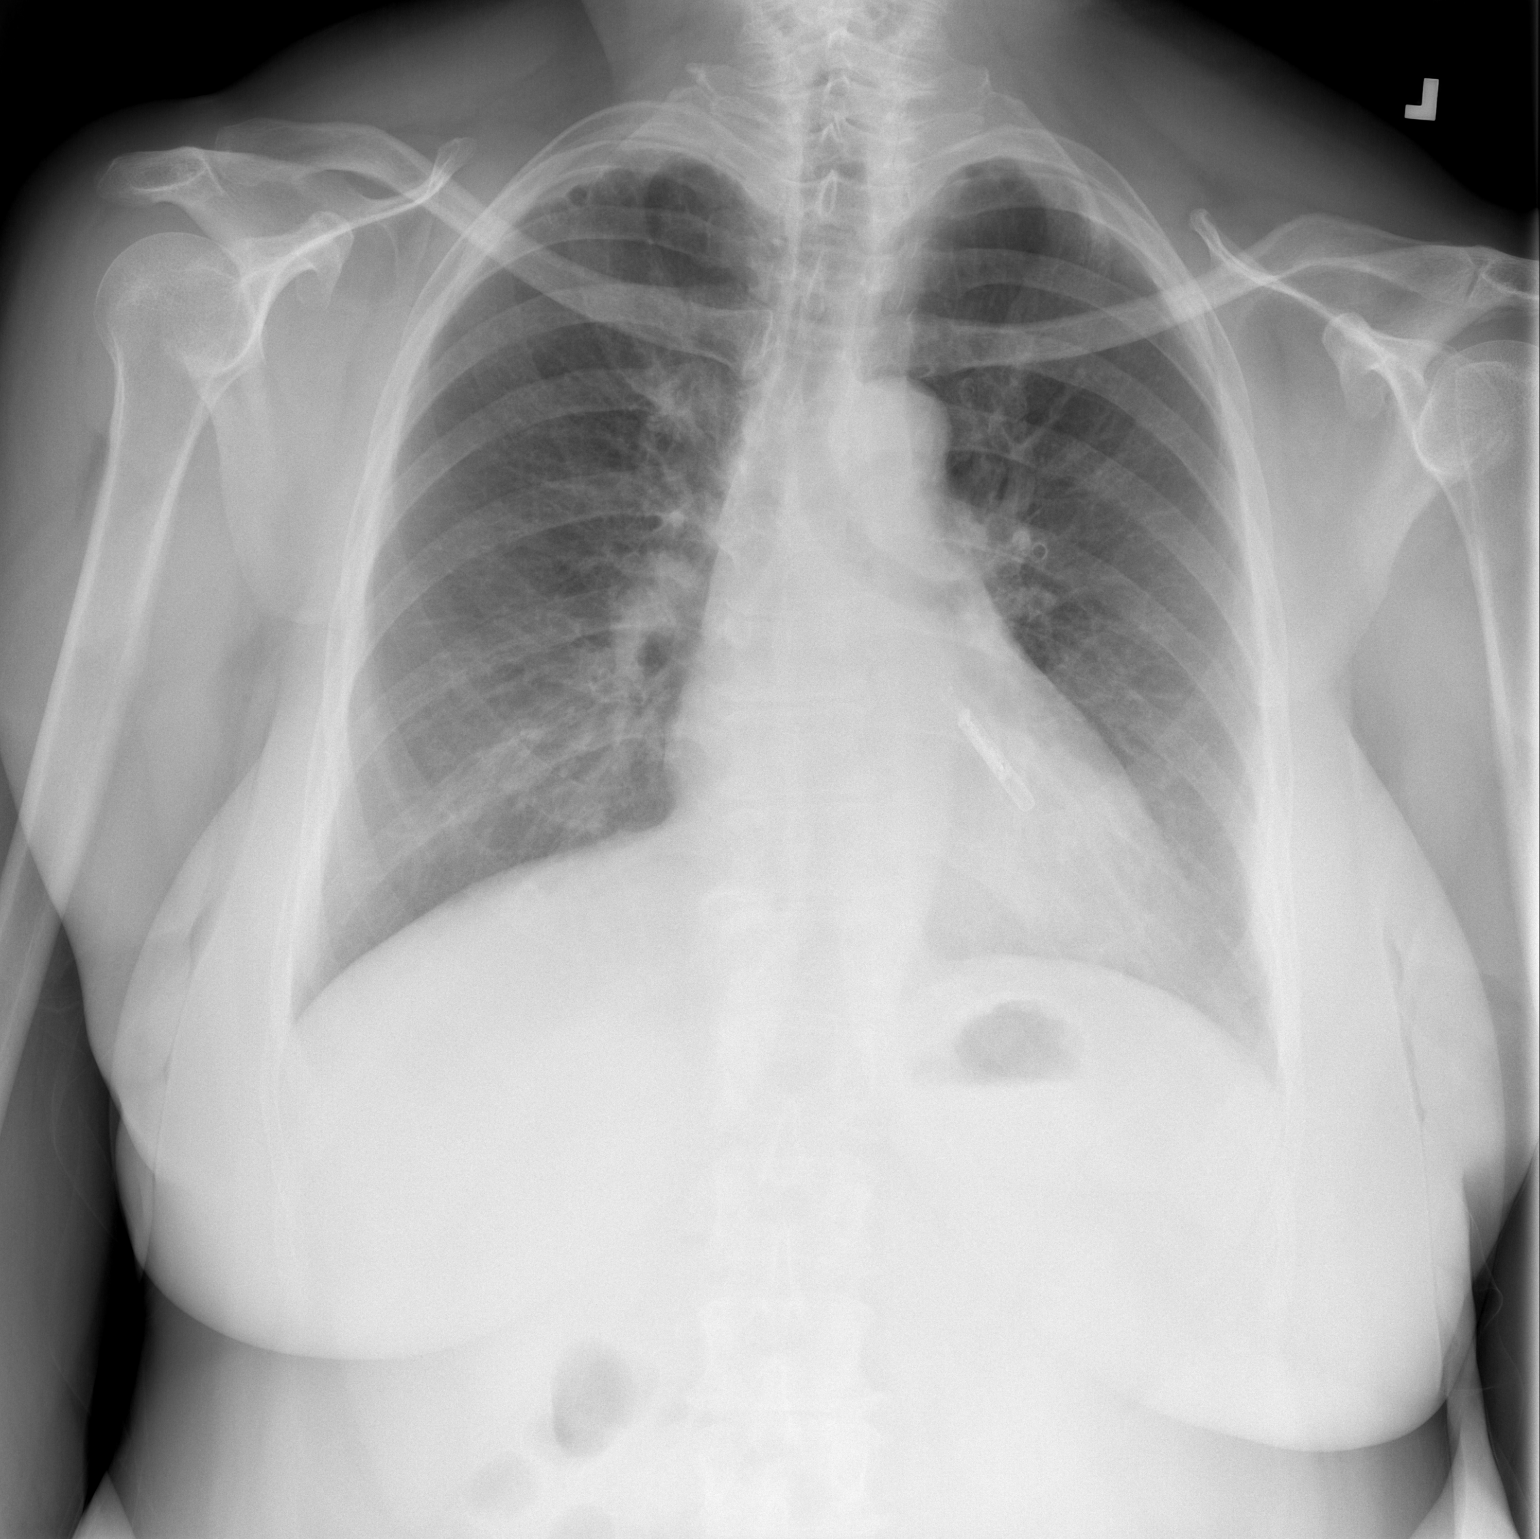

[w chest lat]
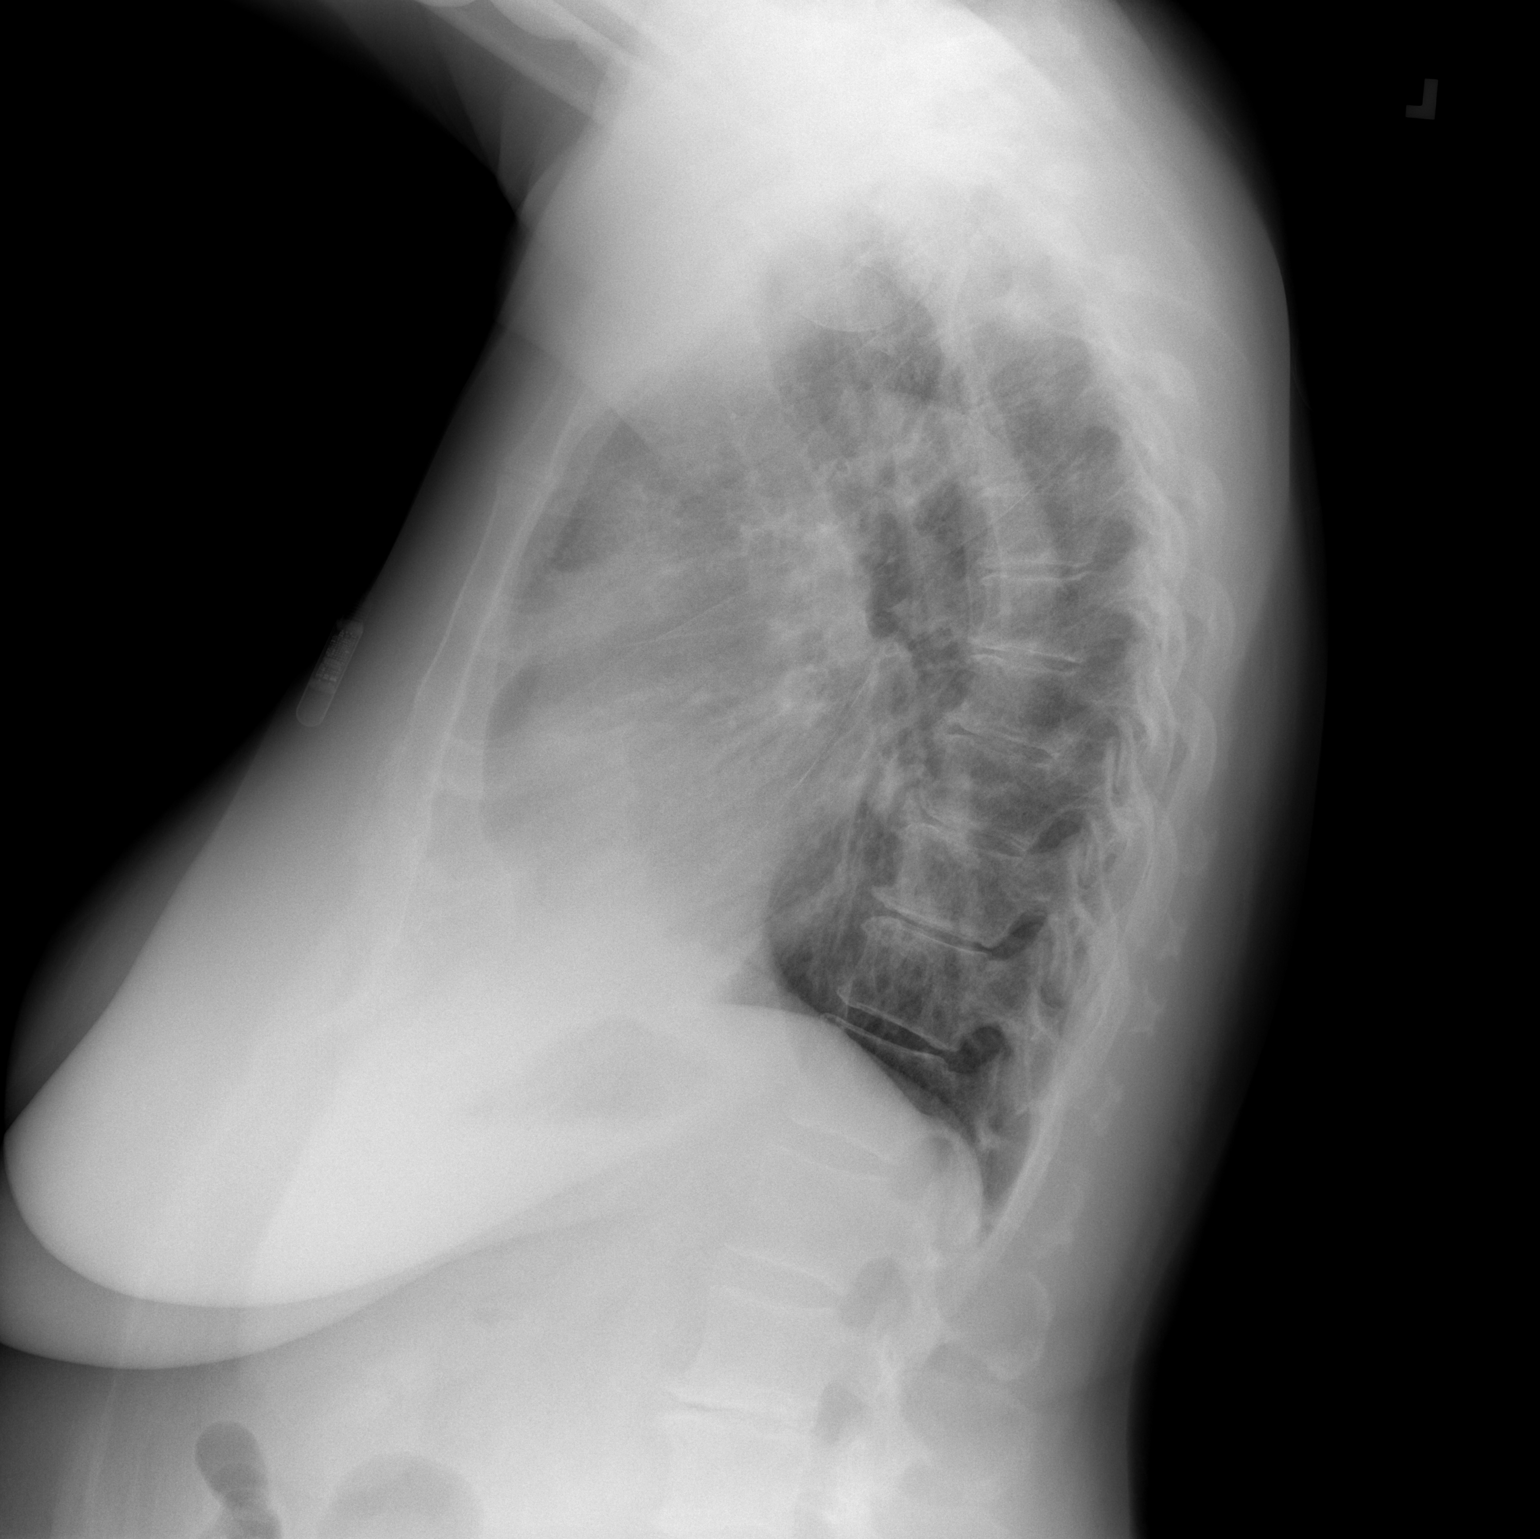

[2 of 2 positions shown; findings below may reference images not displayed]

FINDINGS: Loop recorder projects over LEFT chest.

Upper normal heart size.

Mediastinal contours and pulmonary vascularity normal.

Chronic peribronchial thickening.

No pulmonary infiltrate, pleural effusion, or pneumothorax.

RIGHT apex scarring stable.

No acute osseous findings.
IMPRESSION: No acute abnormalities.

## 2023-02-22 DIAGNOSIS — M65352 Trigger finger, left little finger: Secondary | ICD-10-CM | POA: Diagnosis not present

## 2023-02-22 DIAGNOSIS — S62635P Displaced fracture of distal phalanx of left ring finger, subsequent encounter for fracture with malunion: Secondary | ICD-10-CM | POA: Diagnosis not present

## 2023-03-21 ENCOUNTER — Other Ambulatory Visit: Payer: Self-pay | Admitting: *Deleted

## 2023-03-21 DIAGNOSIS — I10 Essential (primary) hypertension: Secondary | ICD-10-CM

## 2023-03-21 DIAGNOSIS — I480 Paroxysmal atrial fibrillation: Secondary | ICD-10-CM

## 2023-03-21 MED ORDER — TELMISARTAN-HCTZ 80-25 MG PO TABS: ORAL_TABLET | ORAL | 0 refills | Status: DC

## 2023-03-25 ENCOUNTER — Other Ambulatory Visit: Payer: Self-pay

## 2023-03-25 MED ORDER — DILTIAZEM HCL ER COATED BEADS 240 MG PO CP24: 240.0000 mg | ORAL_CAPSULE | Freq: Every day | ORAL | 0 refills | Status: DC

## 2023-04-22 ENCOUNTER — Other Ambulatory Visit: Payer: Self-pay | Admitting: Cardiology

## 2023-04-23 ENCOUNTER — Other Ambulatory Visit: Payer: Self-pay | Admitting: Cardiology

## 2023-04-23 DIAGNOSIS — I10 Essential (primary) hypertension: Secondary | ICD-10-CM

## 2023-04-23 DIAGNOSIS — I480 Paroxysmal atrial fibrillation: Secondary | ICD-10-CM

## 2023-09-05 DIAGNOSIS — L720 Epidermal cyst: Secondary | ICD-10-CM | POA: Diagnosis not present

## 2024-01-20 ENCOUNTER — Emergency Department (HOSPITAL_BASED_OUTPATIENT_CLINIC_OR_DEPARTMENT_OTHER)

## 2024-01-20 ENCOUNTER — Other Ambulatory Visit: Payer: Self-pay

## 2024-01-20 ENCOUNTER — Encounter (HOSPITAL_BASED_OUTPATIENT_CLINIC_OR_DEPARTMENT_OTHER): Payer: Self-pay | Admitting: Emergency Medicine

## 2024-01-20 ENCOUNTER — Emergency Department (HOSPITAL_BASED_OUTPATIENT_CLINIC_OR_DEPARTMENT_OTHER)
Admission: EM | Admit: 2024-01-20 | Discharge: 2024-01-20 | Disposition: A | Discharge status: Home / Self Care | Attending: Emergency Medicine | Admitting: Emergency Medicine

## 2024-01-20 DIAGNOSIS — I4891 Unspecified atrial fibrillation: Secondary | ICD-10-CM | POA: Diagnosis not present

## 2024-01-20 DIAGNOSIS — I10 Essential (primary) hypertension: Secondary | ICD-10-CM | POA: Insufficient documentation

## 2024-01-20 DIAGNOSIS — J929 Pleural plaque without asbestos: Secondary | ICD-10-CM | POA: Diagnosis not present

## 2024-01-20 DIAGNOSIS — R079 Chest pain, unspecified: Secondary | ICD-10-CM | POA: Diagnosis not present

## 2024-01-20 DIAGNOSIS — M545 Low back pain, unspecified: Secondary | ICD-10-CM | POA: Diagnosis not present

## 2024-01-20 DIAGNOSIS — M5431 Sciatica, right side: Secondary | ICD-10-CM | POA: Insufficient documentation

## 2024-01-20 DIAGNOSIS — E876 Hypokalemia: Secondary | ICD-10-CM | POA: Diagnosis not present

## 2024-01-20 DIAGNOSIS — Z7901 Long term (current) use of anticoagulants: Secondary | ICD-10-CM | POA: Insufficient documentation

## 2024-01-20 DIAGNOSIS — K760 Fatty (change of) liver, not elsewhere classified: Secondary | ICD-10-CM | POA: Diagnosis not present

## 2024-01-20 DIAGNOSIS — M5441 Lumbago with sciatica, right side: Secondary | ICD-10-CM | POA: Diagnosis not present

## 2024-01-20 LAB — URINALYSIS, ROUTINE W REFLEX MICROSCOPIC
Bilirubin Urine: NEGATIVE
Glucose, UA: NEGATIVE mg/dL
Hgb urine dipstick: NEGATIVE
Ketones, ur: NEGATIVE mg/dL
Leukocytes,Ua: NEGATIVE
Nitrite: NEGATIVE
Protein, ur: NEGATIVE mg/dL
Specific Gravity, Urine: 1.015 (ref 1.005–1.030)
pH: 7 (ref 5.0–8.0)

## 2024-01-20 LAB — BASIC METABOLIC PANEL
Anion gap: 10 (ref 5–15)
BUN: 19 mg/dL (ref 8–23)
CO2: 24 mmol/L (ref 22–32)
Calcium: 9.1 mg/dL (ref 8.9–10.3)
Chloride: 100 mmol/L (ref 98–111)
Creatinine, Ser: 1.07 mg/dL — ABNORMAL HIGH (ref 0.44–1.00)
GFR, Estimated: 57 mL/min — ABNORMAL LOW (ref >60)
Glucose, Bld: 98 mg/dL (ref 70–99)
Potassium: 3.9 mmol/L (ref 3.5–5.1)
Sodium: 134 mmol/L — ABNORMAL LOW (ref 135–145)

## 2024-01-20 LAB — HEPATIC FUNCTION PANEL
ALT: 16 U/L (ref 0–44)
AST: 22 U/L (ref 15–41)
Albumin: 4.4 g/dL (ref 3.5–5.0)
Alkaline Phosphatase: 94 U/L (ref 38–126)
Bilirubin, Direct: 0.1 mg/dL (ref 0.0–0.2)
Indirect Bilirubin: 0.5 mg/dL (ref 0.3–0.9)
Total Bilirubin: 0.6 mg/dL (ref 0.0–1.2)
Total Protein: 7.9 g/dL (ref 6.5–8.1)

## 2024-01-20 LAB — CBC
HCT: 40.5 % (ref 36.0–46.0)
Hemoglobin: 14.3 g/dL (ref 12.0–15.0)
MCH: 33.7 pg (ref 26.0–34.0)
MCHC: 35.3 g/dL (ref 30.0–36.0)
MCV: 95.5 fL (ref 80.0–100.0)
Platelets: 204 10*3/uL (ref 150–400)
RBC: 4.24 MIL/uL (ref 3.87–5.11)
RDW: 13.6 % (ref 11.5–15.5)
WBC: 4 10*3/uL (ref 4.0–10.5)
nRBC: 0 % (ref 0.0–0.2)

## 2024-01-20 LAB — TROPONIN I (HIGH SENSITIVITY)
Troponin I (High Sensitivity): 8 ng/L (ref <18)
Troponin I (High Sensitivity): 8 ng/L (ref <18)

## 2024-01-20 LAB — LIPASE, BLOOD: Lipase: 19 U/L (ref 11–51)

## 2024-01-20 MED ORDER — LIDOCAINE 5 % EX PTCH: 1.0000 | MEDICATED_PATCH | CUTANEOUS | 0 refills | Status: AC

## 2024-01-20 MED ORDER — HYDROMORPHONE HCL 1 MG/ML IJ SOLN
0.5000 mg | Freq: Once | INTRAMUSCULAR | Status: AC
  Administered 2024-01-20: 0.5 mg via INTRAVENOUS
  Filled 2024-01-20: qty 1

## 2024-01-20 MED ORDER — HYDROMORPHONE HCL 1 MG/ML IJ SOLN: 1.0000 mg | Freq: Once | INTRAMUSCULAR | Status: DC

## 2024-01-20 MED ORDER — ONDANSETRON HCL 4 MG/2ML IJ SOLN
4.0000 mg | Freq: Once | INTRAMUSCULAR | Status: AC
  Administered 2024-01-20: 4 mg via INTRAVENOUS
  Filled 2024-01-20: qty 2

## 2024-01-20 MED ORDER — OXYCODONE HCL 5 MG PO TABS: 5.0000 mg | ORAL_TABLET | Freq: Four times a day (QID) | ORAL | 0 refills | Status: AC | PRN

## 2024-01-20 MED ORDER — ACETAMINOPHEN ER 650 MG PO TBCR: 650.0000 mg | EXTENDED_RELEASE_TABLET | Freq: Three times a day (TID) | ORAL | 0 refills | Status: AC | PRN

## 2024-01-20 MED ORDER — IOHEXOL 300 MG/ML  SOLN
100.0000 mL | Freq: Once | INTRAMUSCULAR | Status: AC | PRN
Start: 2024-01-20 — End: 2024-01-20
  Administered 2024-01-20: 100 mL via INTRAVENOUS

## 2024-01-20 MED ORDER — LABETALOL HCL 5 MG/ML IV SOLN
10.0000 mg | Freq: Once | INTRAVENOUS | Status: AC
  Administered 2024-01-20: 10 mg via INTRAVENOUS
  Filled 2024-01-20: qty 4

## 2024-01-20 NOTE — ED Triage Notes (Signed)
 Pt POV c/o R side and back pain x4 days.  Denies urinary sx.   Denies falls or injury.    Also reports nausea. Denies emesis. Reports compliance with BP meds. Reports taking ibuprofen with no relief.

## 2024-01-20 NOTE — ED Provider Notes (Signed)
 Emergency Department Provider Note   I have reviewed the triage vital signs and the nursing notes.   HISTORY  Chief Complaint Back Pain   HPI Jill Escobar is a 68 y.o. female past history of hypertension and A-fib not on anticoagulation presents to the emergency department with right flank pain radiating into the right anterior thigh.  Symptoms been present for the past 4 days.  Pain is worse with movement and with standing.  No known injury.  No numbness into the legs or groin area.  No bowel or bladder incontinence.  No fevers.  Patient does have some urinary hesitancy and urgency but denies dysuria or fevers.  No pain into the chest or upper back.  No shortness of breath.  Patient does not have a particularly physical job.  She reports compliance with her home medications including her blood pressure medicines but notes that she is in significant pain despite taking Tylenol and ibuprofen at home.   Past Medical History:  Diagnosis Date   Atrial fibrillation (HCC)    HTN (hypertension)    Hypokalemia    Osteoarthritis    L hip   Overweight     Review of Systems  Constitutional: No fever/chills Cardiovascular: Denies chest pain. Respiratory: Denies shortness of breath. Gastrointestinal: Positive abdominal pain.  No nausea, no vomiting.  Skin: Negative for rash. Neurological: Negative for headaches.   ____________________________________________   PHYSICAL EXAM:  VITAL SIGNS: ED Triage Vitals  Encounter Vitals Group     BP 01/20/24 1325 (!) 227/111     Pulse Rate 01/20/24 1325 75     Resp 01/20/24 1325 20     Temp 01/20/24 1325 97.6 F (36.4 C)     Temp src --      SpO2 01/20/24 1325 100 %     Weight 01/20/24 1328 210 lb (95.3 kg)     Height 01/20/24 1328 5\' 9"  (1.753 m)   Constitutional: Alert and oriented. Patient sitting on the edge of the bed. Appears uncomfortable.  Eyes: Conjunctivae are normal.  Head: Atraumatic. Nose: No  congestion/rhinnorhea. Mouth/Throat: Mucous membranes are moist.   Neck: No stridor.   Cardiovascular: Normal rate, regular rhythm. Good peripheral circulation. Grossly normal heart sounds.   Respiratory: Normal respiratory effort.  No retractions. Lungs CTAB. Gastrointestinal: Soft and nontender. No distention.  Musculoskeletal: No lower extremity tenderness nor edema. No gross deformities of extremities. No midline tenderness to the spine. No visible bruising or rash.  Neurologic:  Normal speech and language.  Skin:  Skin is warm, dry and intact. No rash noted.   ____________________________________________   LABS (all labs ordered are listed, but only abnormal results are displayed)  Labs Reviewed  BASIC METABOLIC PANEL - Abnormal; Notable for the following components:      Result Value   Sodium 134 (*)    Creatinine, Ser 1.07 (*)    GFR, Estimated 57 (*)    All other components within normal limits  CBC  HEPATIC FUNCTION PANEL  LIPASE, BLOOD  URINALYSIS, ROUTINE W REFLEX MICROSCOPIC  TROPONIN I (HIGH SENSITIVITY)  TROPONIN I (HIGH SENSITIVITY)   ____________________________________________  EKG   EKG Interpretation Date/Time:  Friday January 20 2024 13:29:06 EDT Ventricular Rate:  77 PR Interval:  154 QRS Duration:  76 QT Interval:  418 QTC Calculation: 474 R Axis:   34  Text Interpretation: Sinus rhythm Nonspecific T abnormalities, lateral leads Confirmed by Alona Bene [phone]) on 01/20/2024 1:37:09 PM  ____________________________________________   PROCEDURES  Procedure(s) performed:   Procedures  None  ____________________________________________   INITIAL IMPRESSION / ASSESSMENT AND PLAN / ED COURSE  Pertinent labs & imaging results that were available during my care of the patient were reviewed by me and considered in my medical decision making (see chart for details).   This patient is Presenting for Evaluation of back/flank pain, which  does require a range of treatment options, and is a complaint that involves a high risk of morbidity and mortality.  The Differential Diagnoses  includes but is not exclusive to musculoskeletal back pain, renal colic, urinary tract infection, pyelonephritis, intra-abdominal causes of back pain, aortic aneurysm or dissection, cauda equina syndrome, sciatica, lumbar disc disease, thoracic disc disease, etc.   Critical Interventions-    Medications  ondansetron (ZOFRAN) injection 4 mg (4 mg Intravenous Given 01/20/24 1447)  HYDROmorphone (DILAUDID) injection 0.5 mg (0.5 mg Intravenous Given 01/20/24 1446)  labetalol (NORMODYNE) injection 10 mg (10 mg Intravenous Given 01/20/24 1504)  iohexol (OMNIPAQUE) 300 MG/ML solution 100 mL (100 mLs Intravenous Contrast Given 01/20/24 1511)  HYDROmorphone (DILAUDID) injection 0.5 mg (0.5 mg Intravenous Given 01/20/24 1615)    Reassessment after intervention:    Clinical Laboratory Tests Ordered, included CBC without leukocytosis or anemia.  Basic metabolic panel shows creatinine of 1.07 with normal BUN.  Normal electrolytes.   Radiologic Tests Ordered, included CXR and CT abdomen/pelvis pending at time of patient care transfer.   Cardiac Monitor Tracing which shows NSR.    Social Determinants of Health Risk patient is not an active smoker.   Medical Decision Making: Summary:  Patient presents emergency department with acute onset right flank pain radiating to the anterior upper thigh.  No visible rash or bruising.  Abdomen is diffusely soft and nontender.  Some UTI symptoms with hesitancy/urgency but no dysuria or fever.  Significantly elevated blood pressure on arrival, likely secondary to pain.  Will start by treating pain symptoms and reassessing BP.  Exceedingly low suspicion for vascular etiology to explain back pain with primarily right flank symptoms which are fairly reproducible.  Do plan for CT abdomen pelvis with contrast to  reassess.  Reevaluation with update and discussion with patient. Care transferred to Dr. Doran Durand. Plan for CT/CXR and BP mgmt with close monitoring in the ED. Dispo pending.   Patient's presentation is most consistent with acute presentation with potential threat to life or bodily function.   Disposition: pending   ____________________________________________  FINAL CLINICAL IMPRESSION(S) / ED DIAGNOSES  Final diagnoses:  Sciatica of right side    NEW OUTPATIENT MEDICATIONS STARTED DURING THIS VISIT:  Discharge Medication List as of 01/20/2024  6:03 PM     START taking these medications   Details  lidocaine (LIDODERM) 5 % Place 1 patch onto the skin daily. Remove & Discard patch within 12 hours or as directed by MD, Starting Fri 01/20/2024, Normal    oxyCODONE (ROXICODONE) 5 MG immediate release tablet Take 1-2 tablets (5-10 mg total) by mouth every 6 (six) hours as needed for severe pain (pain score 7-10)., Starting Fri 01/20/2024, Normal        Note:  This document was prepared using Dragon voice recognition software and may include unintentional dictation errors.  Alona Bene, MD, [facility]    Klover Priestly, Arlyss Repress, MD 01/21/24 [phone]

## 2024-01-20 NOTE — ED Provider Notes (Signed)
 Care of patient received from prior provider at 5:57 PM, please see their note for complete H/P and care plan.  Received handoff per ED course.  Clinical Course as of 01/20/24 1757  Fri Jan 20, 2024  1502 Stable HO JL Flank pain. Labs OK  Pending CTAP Getting BP and pain treated.  [CC]    Clinical Course User Index [CC] Glyn Ade, MD    Reassessment: Patient's history of present illness physical exam findings did not reveal any acute pathology per the workup ordered by prior provider. CT scan negative for any focal pathology.  Blood pressure improved over 40% while patient was in the emergency room.  No evidence of acute hypertensive crisis given normal lab work, lack of any focal symptoms. Discussed multimodal care for her back pain that appears to be most consistent with sciatica. Patient in no acute distress on serial reassessments.  Will refer to spine center.  No red flag cauda equina syndromes including lack of saddle anesthesia or urinary incontinence or urinary retention. Patient feels comfortable outpatient care management with strict return precautions reinforced.     Glyn Ade, MD 01/20/24 1757

## 2024-01-20 NOTE — ED Notes (Signed)
 Patient transported to CT

## 2024-01-21 ENCOUNTER — Telehealth (HOSPITAL_BASED_OUTPATIENT_CLINIC_OR_DEPARTMENT_OTHER): Payer: Self-pay | Admitting: Medical

## 2024-01-21 NOTE — Telephone Encounter (Cosign Needed)
 Pharmacy requesting oxycodone to be switched to 1-2 tabs every 8 hours to follow CDC guidelines--[facility] pharmacist request.

## 2024-08-14 ENCOUNTER — Other Ambulatory Visit
